# Patient Record
Sex: Male | Born: 1955 | Race: White | Hispanic: No | Marital: Single | Smoking: Light tobacco smoker
Health system: Southern US, Community
[De-identification: ages and names within clinical notes are randomized; demographics above are authoritative.]

## PROBLEM LIST (undated history)

## (undated) DIAGNOSIS — J90 Pleural effusion, not elsewhere classified: Secondary | ICD-10-CM

## (undated) DIAGNOSIS — F418 Other specified anxiety disorders: Secondary | ICD-10-CM

## (undated) DIAGNOSIS — C801 Malignant (primary) neoplasm, unspecified: Secondary | ICD-10-CM

## (undated) DIAGNOSIS — J189 Pneumonia, unspecified organism: Secondary | ICD-10-CM

## (undated) DIAGNOSIS — I10 Essential (primary) hypertension: Secondary | ICD-10-CM

## (undated) DIAGNOSIS — C3490 Malignant neoplasm of unspecified part of unspecified bronchus or lung: Secondary | ICD-10-CM

## (undated) DIAGNOSIS — E43 Unspecified severe protein-calorie malnutrition: Secondary | ICD-10-CM

## (undated) DIAGNOSIS — Z9221 Personal history of antineoplastic chemotherapy: Secondary | ICD-10-CM

## (undated) HISTORY — DX: Personal history of antineoplastic chemotherapy: Z92.21

## (undated) HISTORY — DX: Malignant neoplasm of unspecified part of unspecified bronchus or lung: C34.90

---

## 2007-08-11 ENCOUNTER — Ambulatory Visit (HOSPITAL_COMMUNITY): Admission: RE | Admit: 2007-08-11 | Discharge: 2007-08-11 | Payer: Self-pay | Admitting: Family Medicine

## 2007-08-18 ENCOUNTER — Ambulatory Visit: Payer: Self-pay | Admitting: Thoracic Surgery (Cardiothoracic Vascular Surgery)

## 2007-08-19 ENCOUNTER — Encounter: Payer: Self-pay | Admitting: Thoracic Surgery (Cardiothoracic Vascular Surgery)

## 2007-08-19 ENCOUNTER — Ambulatory Visit (HOSPITAL_COMMUNITY)
Admission: RE | Admit: 2007-08-19 | Discharge: 2007-08-19 | Payer: Self-pay | Admitting: Thoracic Surgery (Cardiothoracic Vascular Surgery)

## 2007-08-19 ENCOUNTER — Ambulatory Visit: Payer: Self-pay | Admitting: Thoracic Surgery (Cardiothoracic Vascular Surgery)

## 2007-08-19 ENCOUNTER — Ambulatory Visit: Payer: Self-pay | Admitting: Internal Medicine

## 2007-08-20 ENCOUNTER — Ambulatory Visit (HOSPITAL_COMMUNITY)
Admission: RE | Admit: 2007-08-20 | Discharge: 2007-08-20 | Payer: Self-pay | Admitting: Thoracic Surgery (Cardiothoracic Vascular Surgery)

## 2007-08-24 ENCOUNTER — Ambulatory Visit: Payer: Self-pay | Admitting: Internal Medicine

## 2007-08-25 ENCOUNTER — Ambulatory Visit: Payer: Self-pay | Admitting: Thoracic Surgery (Cardiothoracic Vascular Surgery)

## 2007-08-25 LAB — CBC WITH DIFFERENTIAL/PLATELET
BASO%: 0.5 % (ref 0.0–2.0)
EOS%: 5.5 % (ref 0.0–7.0)
LYMPH%: 17.9 % (ref 14.0–48.0)
MCHC: 35.1 g/dL (ref 32.0–35.9)
MONO#: 0.8 10*3/uL (ref 0.1–0.9)
MONO%: 6.8 % (ref 0.0–13.0)
Platelets: 400 10*3/uL (ref 145–400)
RBC: 5.37 10*6/uL (ref 4.20–5.71)
WBC: 11.8 10*3/uL — ABNORMAL HIGH (ref 4.0–10.0)

## 2007-08-25 LAB — COMPREHENSIVE METABOLIC PANEL
ALT: <8 U/L (ref 0–53)
AST: 11 U/L (ref 0–37)
Alkaline Phosphatase: 60 U/L (ref 39–117)
CO2: 21 mEq/L (ref 19–32)
Sodium: 142 mEq/L (ref 135–145)
Total Bilirubin: 0.3 mg/dL (ref 0.3–1.2)
Total Protein: 6 g/dL (ref 6.0–8.3)

## 2007-08-28 ENCOUNTER — Ambulatory Visit (HOSPITAL_COMMUNITY)
Admission: RE | Admit: 2007-08-28 | Discharge: 2007-08-28 | Payer: Self-pay | Admitting: Thoracic Surgery (Cardiothoracic Vascular Surgery)

## 2007-09-04 ENCOUNTER — Ambulatory Visit (HOSPITAL_COMMUNITY): Admission: RE | Admit: 2007-09-04 | Discharge: 2007-09-04 | Payer: Self-pay | Admitting: Internal Medicine

## 2007-09-04 ENCOUNTER — Encounter (INDEPENDENT_AMBULATORY_CARE_PROVIDER_SITE_OTHER): Payer: Self-pay | Admitting: Interventional Radiology

## 2007-09-04 LAB — CBC WITH DIFFERENTIAL/PLATELET
BASO%: 0.4 % (ref 0.0–2.0)
LYMPH%: 8.5 % — ABNORMAL LOW (ref 14.0–48.0)
MCH: 28.8 pg (ref 28.0–33.4)
MCHC: 35.1 g/dL (ref 32.0–35.9)
MCV: 81.9 fL (ref 81.6–98.0)
MONO%: 6 % (ref 0.0–13.0)
NEUT%: 85.2 % — ABNORMAL HIGH (ref 40.0–75.0)
Platelets: 423 10*3/uL — ABNORMAL HIGH (ref 145–400)
RBC: 5.28 10*6/uL (ref 4.20–5.71)

## 2007-09-04 LAB — UA PROTEIN, DIPSTICK - CHCC: Protein, Urine: <30 mg/dL

## 2007-09-08 ENCOUNTER — Ambulatory Visit: Payer: Self-pay | Admitting: Thoracic Surgery (Cardiothoracic Vascular Surgery)

## 2007-09-08 ENCOUNTER — Encounter
Admission: RE | Admit: 2007-09-08 | Discharge: 2007-09-08 | Payer: Self-pay | Admitting: Thoracic Surgery (Cardiothoracic Vascular Surgery)

## 2007-09-09 ENCOUNTER — Encounter: Payer: Self-pay | Admitting: Thoracic Surgery (Cardiothoracic Vascular Surgery)

## 2007-09-09 ENCOUNTER — Ambulatory Visit (HOSPITAL_COMMUNITY)
Admission: RE | Admit: 2007-09-09 | Discharge: 2007-09-09 | Payer: Self-pay | Admitting: Thoracic Surgery (Cardiothoracic Vascular Surgery)

## 2007-09-11 LAB — COMPREHENSIVE METABOLIC PANEL
ALT: 16 U/L (ref 0–53)
CO2: 28 mEq/L (ref 19–32)
Creatinine, Ser: 0.69 mg/dL (ref 0.40–1.50)
Total Bilirubin: 0.6 mg/dL (ref 0.3–1.2)

## 2007-09-11 LAB — CBC WITH DIFFERENTIAL/PLATELET
Basophils Absolute: 0 10*3/uL (ref 0.0–0.1)
EOS%: 4.9 % (ref 0.0–7.0)
Eosinophils Absolute: 0.3 10*3/uL (ref 0.0–0.5)
HCT: 42.1 % (ref 38.7–49.9)
HGB: 14.9 g/dL (ref 13.0–17.1)
MONO#: 0.4 10*3/uL (ref 0.1–0.9)
NEUT#: 4.1 10*3/uL (ref 1.5–6.5)
NEUT%: 69.4 % (ref 40.0–75.0)
RDW: 12.4 % (ref 11.2–14.6)
WBC: 5.9 10*3/uL (ref 4.0–10.0)
lymph#: 1.1 10*3/uL (ref 0.9–3.3)

## 2007-09-18 LAB — COMPREHENSIVE METABOLIC PANEL
Albumin: 3.5 g/dL (ref 3.5–5.2)
BUN: 14 mg/dL (ref 6–23)
CO2: 27 mEq/L (ref 19–32)
Calcium: 8.5 mg/dL (ref 8.4–10.5)
Chloride: 104 mEq/L (ref 96–112)
Creatinine, Ser: 0.65 mg/dL (ref 0.40–1.50)

## 2007-09-18 LAB — CBC WITH DIFFERENTIAL/PLATELET
Basophils Absolute: 0 10*3/uL (ref 0.0–0.1)
Eosinophils Absolute: 0.1 10*3/uL (ref 0.0–0.5)
HCT: 37.6 % — ABNORMAL LOW (ref 38.7–49.9)
HGB: 13.3 g/dL (ref 13.0–17.1)
LYMPH%: 38.5 % (ref 14.0–48.0)
MONO#: 0.4 10*3/uL (ref 0.1–0.9)
NEUT#: 2.1 10*3/uL (ref 1.5–6.5)
NEUT%: 49.8 % (ref 40.0–75.0)
Platelets: 246 10*3/uL (ref 145–400)
WBC: 4.2 10*3/uL (ref 4.0–10.0)

## 2007-09-25 LAB — COMPREHENSIVE METABOLIC PANEL
ALT: 23 U/L (ref 0–53)
AST: 15 U/L (ref 0–37)
CO2: 21 mEq/L (ref 19–32)
Creatinine, Ser: 0.63 mg/dL (ref 0.40–1.50)
Sodium: 140 mEq/L (ref 135–145)
Total Bilirubin: 0.2 mg/dL — ABNORMAL LOW (ref 0.3–1.2)
Total Protein: 6.2 g/dL (ref 6.0–8.3)

## 2007-09-25 LAB — CBC WITH DIFFERENTIAL/PLATELET
BASO%: 0.3 % (ref 0.0–2.0)
Basophils Absolute: 0 10*3/uL (ref 0.0–0.1)
HCT: 43.6 % (ref 38.7–49.9)
HGB: 15.2 g/dL (ref 13.0–17.1)
MCHC: 34.8 g/dL (ref 32.0–35.9)
MONO#: 1.8 10*3/uL — ABNORMAL HIGH (ref 0.1–0.9)
NEUT%: 73.2 % (ref 40.0–75.0)
RDW: 14 % (ref 11.2–14.6)
WBC: 15.6 10*3/uL — ABNORMAL HIGH (ref 4.0–10.0)
lymph#: 2.3 10*3/uL (ref 0.9–3.3)

## 2007-09-25 LAB — UA PROTEIN, DIPSTICK - CHCC: Protein, Urine: <30 mg/dL

## 2007-09-29 ENCOUNTER — Ambulatory Visit: Payer: Self-pay | Admitting: Internal Medicine

## 2007-10-02 LAB — CBC WITH DIFFERENTIAL/PLATELET
BASO%: 0.5 % (ref 0.0–2.0)
EOS%: 1.8 % (ref 0.0–7.0)
HCT: 43 % (ref 38.7–49.9)
LYMPH%: 35.9 % (ref 14.0–48.0)
MCH: 28.9 pg (ref 28.0–33.4)
MCHC: 34.2 g/dL (ref 32.0–35.9)
MCV: 84.4 fL (ref 81.6–98.0)
MONO#: 0.6 10*3/uL (ref 0.1–0.9)
MONO%: 11.3 % (ref 0.0–13.0)
NEUT%: 50.5 % (ref 40.0–75.0)
Platelets: 227 10*3/uL (ref 145–400)
RBC: 5.09 10*6/uL (ref 4.20–5.71)
WBC: 5.3 10*3/uL (ref 4.0–10.0)

## 2007-10-02 LAB — COMPREHENSIVE METABOLIC PANEL
ALT: 34 U/L (ref 0–53)
AST: 29 U/L (ref 0–37)
Alkaline Phosphatase: 82 U/L (ref 39–117)
BUN: 20 mg/dL (ref 6–23)
Calcium: 9.7 mg/dL (ref 8.4–10.5)
Chloride: 104 mEq/L (ref 96–112)
Creatinine, Ser: 0.66 mg/dL (ref 0.40–1.50)
Total Bilirubin: 0.5 mg/dL (ref 0.3–1.2)

## 2007-10-02 LAB — UA PROTEIN, DIPSTICK - CHCC: Protein, Urine: 30 mg/dL

## 2007-10-09 LAB — COMPREHENSIVE METABOLIC PANEL
ALT: 23 U/L (ref 0–53)
CO2: 28 mEq/L (ref 19–32)
Chloride: 99 mEq/L (ref 96–112)
Sodium: 137 mEq/L (ref 135–145)
Total Bilirubin: 0.3 mg/dL (ref 0.3–1.2)
Total Protein: 6 g/dL (ref 6.0–8.3)

## 2007-10-09 LAB — CBC WITH DIFFERENTIAL/PLATELET
BASO%: 0.4 % (ref 0.0–2.0)
LYMPH%: 40.6 % (ref 14.0–48.0)
MCHC: 35 g/dL (ref 32.0–35.9)
MONO#: 0.5 10*3/uL (ref 0.1–0.9)
RBC: 4.18 10*6/uL — ABNORMAL LOW (ref 4.20–5.71)
WBC: 3.6 10*3/uL — ABNORMAL LOW (ref 4.0–10.0)
lymph#: 1.5 10*3/uL (ref 0.9–3.3)

## 2007-10-16 LAB — CBC WITH DIFFERENTIAL/PLATELET
BASO%: 1.4 % (ref 0.0–2.0)
Basophils Absolute: 0.1 10*3/uL (ref 0.0–0.1)
HCT: 36.2 % — ABNORMAL LOW (ref 38.7–49.9)
HGB: 12.6 g/dL — ABNORMAL LOW (ref 13.0–17.1)
LYMPH%: 34.9 % (ref 14.0–48.0)
MCH: 29.2 pg (ref 28.0–33.4)
MCHC: 34.7 g/dL (ref 32.0–35.9)
MONO#: 1 10*3/uL — ABNORMAL HIGH (ref 0.1–0.9)
NEUT%: 42.8 % (ref 40.0–75.0)
Platelets: 304 10*3/uL (ref 145–400)
WBC: 5.8 10*3/uL (ref 4.0–10.0)
lymph#: 2 10*3/uL (ref 0.9–3.3)

## 2007-10-16 LAB — COMPREHENSIVE METABOLIC PANEL
AST: 21 U/L (ref 0–37)
Albumin: 3.6 g/dL (ref 3.5–5.2)
BUN: 9 mg/dL (ref 6–23)
Calcium: 8.7 mg/dL (ref 8.4–10.5)
Chloride: 105 mEq/L (ref 96–112)
Creatinine, Ser: 0.66 mg/dL (ref 0.40–1.50)
Glucose, Bld: 99 mg/dL (ref 70–99)
Potassium: 3.9 mEq/L (ref 3.5–5.3)

## 2007-11-11 LAB — COMPREHENSIVE METABOLIC PANEL
ALT: 13 U/L (ref 0–53)
AST: 23 U/L (ref 0–37)
Albumin: 4 g/dL (ref 3.5–5.2)
Calcium: 9.1 mg/dL (ref 8.4–10.5)
Chloride: 105 mEq/L (ref 96–112)
Potassium: 3.6 mEq/L (ref 3.5–5.3)
Sodium: 141 mEq/L (ref 135–145)

## 2007-11-11 LAB — CBC WITH DIFFERENTIAL/PLATELET
BASO%: 1.1 % (ref 0.0–2.0)
Eosinophils Absolute: 0.1 10*3/uL (ref 0.0–0.5)
MCHC: 34.8 g/dL (ref 32.0–35.9)
MONO#: 1.1 10*3/uL — ABNORMAL HIGH (ref 0.1–0.9)
NEUT#: 4.3 10*3/uL (ref 1.5–6.5)
RBC: 4.14 10*6/uL — ABNORMAL LOW (ref 4.20–5.71)
RDW: 15.4 % — ABNORMAL HIGH (ref 11.2–14.6)
WBC: 7.3 10*3/uL (ref 4.0–10.0)
lymph#: 1.6 10*3/uL (ref 0.9–3.3)

## 2007-11-11 LAB — UA PROTEIN, DIPSTICK - CHCC: Protein, Urine: NEGATIVE mg/dL

## 2007-11-12 ENCOUNTER — Ambulatory Visit: Payer: Self-pay | Admitting: Internal Medicine

## 2007-11-12 ENCOUNTER — Ambulatory Visit (HOSPITAL_COMMUNITY): Admission: RE | Admit: 2007-11-12 | Discharge: 2007-11-12 | Payer: Self-pay | Admitting: Internal Medicine

## 2007-11-18 LAB — COMPREHENSIVE METABOLIC PANEL
AST: 20 U/L (ref 0–37)
Albumin: 4.4 g/dL (ref 3.5–5.2)
Alkaline Phosphatase: 76 U/L (ref 39–117)
BUN: 14 mg/dL (ref 6–23)
Potassium: 3.3 mEq/L — ABNORMAL LOW (ref 3.5–5.3)
Total Bilirubin: 0.6 mg/dL (ref 0.3–1.2)

## 2007-11-18 LAB — CBC WITH DIFFERENTIAL/PLATELET
BASO%: 0.3 % (ref 0.0–2.0)
EOS%: 1.9 % (ref 0.0–7.0)
HCT: 36 % — ABNORMAL LOW (ref 38.7–49.9)
MCH: 30.5 pg (ref 28.0–33.4)
MCHC: 35.2 g/dL (ref 32.0–35.9)
NEUT%: 50.7 % (ref 40.0–75.0)
RDW: 16.6 % — ABNORMAL HIGH (ref 11.2–14.6)
lymph#: 1.5 10*3/uL (ref 0.9–3.3)

## 2007-11-25 LAB — CBC WITH DIFFERENTIAL/PLATELET
Basophils Absolute: 0 10*3/uL (ref 0.0–0.1)
EOS%: 1.1 % (ref 0.0–7.0)
HCT: 35.9 % — ABNORMAL LOW (ref 38.7–49.9)
HGB: 12.7 g/dL — ABNORMAL LOW (ref 13.0–17.1)
LYMPH%: 33.9 % (ref 14.0–48.0)
MCH: 31.1 pg (ref 28.0–33.4)
MCV: 88 fL (ref 81.6–98.0)
MONO%: 11.2 % (ref 0.0–13.0)
NEUT%: 53.5 % (ref 40.0–75.0)
Platelets: 177 10*3/uL (ref 145–400)
RDW: 17.3 % — ABNORMAL HIGH (ref 11.2–14.6)

## 2007-11-25 LAB — COMPREHENSIVE METABOLIC PANEL
AST: 46 U/L — ABNORMAL HIGH (ref 0–37)
Alkaline Phosphatase: 88 U/L (ref 39–117)
BUN: 9 mg/dL (ref 6–23)
Creatinine, Ser: 0.77 mg/dL (ref 0.40–1.50)
Glucose, Bld: 109 mg/dL — ABNORMAL HIGH (ref 70–99)
Total Bilirubin: 0.3 mg/dL (ref 0.3–1.2)

## 2007-12-02 LAB — CBC WITH DIFFERENTIAL/PLATELET
Basophils Absolute: 0.1 10*3/uL (ref 0.0–0.1)
Eosinophils Absolute: 0 10*3/uL (ref 0.0–0.5)
HGB: 13.3 g/dL (ref 13.0–17.1)
MCV: 89.8 fL (ref 81.6–98.0)
MONO#: 0.3 10*3/uL (ref 0.1–0.9)
NEUT#: 4.5 10*3/uL (ref 1.5–6.5)
RBC: 4.23 10*6/uL (ref 4.20–5.71)
RDW: 18.4 % — ABNORMAL HIGH (ref 11.2–14.6)
WBC: 6 10*3/uL (ref 4.0–10.0)

## 2007-12-02 LAB — COMPREHENSIVE METABOLIC PANEL
AST: 24 U/L (ref 0–37)
Albumin: 4.5 g/dL (ref 3.5–5.2)
Alkaline Phosphatase: 70 U/L (ref 39–117)
BUN: 12 mg/dL (ref 6–23)
Calcium: 9.6 mg/dL (ref 8.4–10.5)
Creatinine, Ser: 0.7 mg/dL (ref 0.40–1.50)
Glucose, Bld: 144 mg/dL — ABNORMAL HIGH (ref 70–99)
Potassium: 3.9 mEq/L (ref 3.5–5.3)

## 2007-12-09 LAB — CBC WITH DIFFERENTIAL/PLATELET
Basophils Absolute: 0 10*3/uL (ref 0.0–0.1)
Eosinophils Absolute: 0 10*3/uL (ref 0.0–0.5)
HCT: 33.1 % — ABNORMAL LOW (ref 38.7–49.9)
HGB: 11.7 g/dL — ABNORMAL LOW (ref 13.0–17.1)
LYMPH%: 49 % — ABNORMAL HIGH (ref 14.0–48.0)
MCV: 89.8 fL (ref 81.6–98.0)
MONO#: 0.5 10*3/uL (ref 0.1–0.9)
MONO%: 13.6 % — ABNORMAL HIGH (ref 0.0–13.0)
NEUT#: 1.4 10*3/uL — ABNORMAL LOW (ref 1.5–6.5)
NEUT%: 35.7 % — ABNORMAL LOW (ref 40.0–75.0)
Platelets: 165 10*3/uL (ref 145–400)
WBC: 3.9 10*3/uL — ABNORMAL LOW (ref 4.0–10.0)

## 2007-12-09 LAB — COMPREHENSIVE METABOLIC PANEL
ALT: 31 U/L (ref 0–53)
AST: 29 U/L (ref 0–37)
BUN: 14 mg/dL (ref 6–23)
Creatinine, Ser: 0.76 mg/dL (ref 0.40–1.50)
Total Bilirubin: 0.6 mg/dL (ref 0.3–1.2)

## 2007-12-09 LAB — UA PROTEIN, DIPSTICK - CHCC: Protein, Urine: NEGATIVE mg/dL

## 2007-12-16 LAB — COMPREHENSIVE METABOLIC PANEL
Albumin: 4.4 g/dL (ref 3.5–5.2)
Alkaline Phosphatase: 75 U/L (ref 39–117)
BUN: 14 mg/dL (ref 6–23)
CO2: 26 mEq/L (ref 19–32)
Calcium: 9.5 mg/dL (ref 8.4–10.5)
Glucose, Bld: 105 mg/dL — ABNORMAL HIGH (ref 70–99)
Potassium: 3.5 mEq/L (ref 3.5–5.3)
Sodium: 140 mEq/L (ref 135–145)
Total Protein: 6.7 g/dL (ref 6.0–8.3)

## 2007-12-16 LAB — CBC WITH DIFFERENTIAL/PLATELET
Basophils Absolute: 0 10*3/uL (ref 0.0–0.1)
EOS%: 0.4 % (ref 0.0–7.0)
Eosinophils Absolute: 0 10*3/uL (ref 0.0–0.5)
HCT: 33.2 % — ABNORMAL LOW (ref 38.7–49.9)
HGB: 11.7 g/dL — ABNORMAL LOW (ref 13.0–17.1)
MCH: 32 pg (ref 28.0–33.4)
MCV: 90.9 fL (ref 81.6–98.0)
NEUT#: 3.6 10*3/uL (ref 1.5–6.5)
NEUT%: 56.9 % (ref 40.0–75.0)
RDW: 17.1 % — ABNORMAL HIGH (ref 11.2–14.6)
lymph#: 1.8 10*3/uL (ref 0.9–3.3)

## 2007-12-23 LAB — COMPREHENSIVE METABOLIC PANEL
ALT: 15 U/L (ref 0–53)
AST: 19 U/L (ref 0–37)
Albumin: 4.6 g/dL (ref 3.5–5.2)
Alkaline Phosphatase: 69 U/L (ref 39–117)
Calcium: 9.9 mg/dL (ref 8.4–10.5)
Chloride: 101 mEq/L (ref 96–112)
Potassium: 4.1 mEq/L (ref 3.5–5.3)
Sodium: 138 mEq/L (ref 135–145)
Total Protein: 7.5 g/dL (ref 6.0–8.3)

## 2007-12-23 LAB — CBC WITH DIFFERENTIAL/PLATELET
BASO%: 0.4 % (ref 0.0–2.0)
EOS%: 0 % (ref 0.0–7.0)
HCT: 36.5 % — ABNORMAL LOW (ref 38.7–49.9)
MCHC: 36 g/dL — ABNORMAL HIGH (ref 32.0–35.9)
MONO#: 0.8 10*3/uL (ref 0.1–0.9)
NEUT%: 71.9 % (ref 40.0–75.0)
RBC: 4.18 10*6/uL — ABNORMAL LOW (ref 4.20–5.71)
RDW: 14.9 % — ABNORMAL HIGH (ref 11.2–14.6)
WBC: 8.7 10*3/uL (ref 4.0–10.0)
lymph#: 1.6 10*3/uL (ref 0.9–3.3)

## 2007-12-23 LAB — UA PROTEIN, DIPSTICK - CHCC: Protein, Urine: <30 mg/dL

## 2007-12-30 ENCOUNTER — Ambulatory Visit: Payer: Self-pay | Admitting: Internal Medicine

## 2007-12-30 LAB — CBC WITH DIFFERENTIAL/PLATELET
BASO%: 0.5 % (ref 0.0–2.0)
Basophils Absolute: 0 10*3/uL (ref 0.0–0.1)
EOS%: 1 % (ref 0.0–7.0)
MCH: 32.2 pg (ref 28.0–33.4)
MCHC: 35.4 g/dL (ref 32.0–35.9)
MCV: 91 fL (ref 81.6–98.0)
MONO%: 11.8 % (ref 0.0–13.0)
RDW: 16.4 % — ABNORMAL HIGH (ref 11.2–14.6)
lymph#: 2.3 10*3/uL (ref 0.9–3.3)

## 2007-12-30 LAB — COMPREHENSIVE METABOLIC PANEL
AST: 28 U/L (ref 0–37)
Albumin: 4.5 g/dL (ref 3.5–5.2)
Alkaline Phosphatase: 72 U/L (ref 39–117)
BUN: 19 mg/dL (ref 6–23)
Glucose, Bld: 113 mg/dL — ABNORMAL HIGH (ref 70–99)
Potassium: 3.8 mEq/L (ref 3.5–5.3)
Sodium: 138 mEq/L (ref 135–145)
Total Bilirubin: 0.4 mg/dL (ref 0.3–1.2)

## 2008-01-06 ENCOUNTER — Ambulatory Visit (HOSPITAL_COMMUNITY): Admission: RE | Admit: 2008-01-06 | Discharge: 2008-01-06 | Payer: Self-pay | Admitting: Internal Medicine

## 2008-01-06 LAB — CBC WITH DIFFERENTIAL/PLATELET
Basophils Absolute: 0 10*3/uL (ref 0.0–0.1)
Eosinophils Absolute: 0 10*3/uL (ref 0.0–0.5)
HGB: 11.1 g/dL — ABNORMAL LOW (ref 13.0–17.1)
LYMPH%: 37 % (ref 14.0–48.0)
MCV: 90.8 fL (ref 81.6–98.0)
MONO#: 0.5 10*3/uL (ref 0.1–0.9)
NEUT#: 2 10*3/uL (ref 1.5–6.5)
Platelets: 64 10*3/uL — ABNORMAL LOW (ref 145–400)
RBC: 3.4 10*6/uL — ABNORMAL LOW (ref 4.20–5.71)
RDW: 16.5 % — ABNORMAL HIGH (ref 11.2–14.6)
WBC: 3.9 10*3/uL — ABNORMAL LOW (ref 4.0–10.0)

## 2008-01-06 LAB — COMPREHENSIVE METABOLIC PANEL
ALT: 30 U/L (ref 0–53)
CO2: 26 mEq/L (ref 19–32)
Calcium: 8.9 mg/dL (ref 8.4–10.5)
Chloride: 105 mEq/L (ref 96–112)
Glucose, Bld: 145 mg/dL — ABNORMAL HIGH (ref 70–99)
Sodium: 141 mEq/L (ref 135–145)
Total Protein: 6.5 g/dL (ref 6.0–8.3)

## 2008-01-06 LAB — UA PROTEIN, DIPSTICK - CHCC: Protein, Urine: NEGATIVE mg/dL

## 2008-01-13 LAB — CBC WITH DIFFERENTIAL/PLATELET
Basophils Absolute: 0 10*3/uL (ref 0.0–0.1)
EOS%: 0 % (ref 0.0–7.0)
HCT: 35 % — ABNORMAL LOW (ref 38.7–49.9)
HGB: 12.3 g/dL — ABNORMAL LOW (ref 13.0–17.1)
LYMPH%: 14.4 % (ref 14.0–48.0)
MCH: 32.6 pg (ref 28.0–33.4)
MCV: 92.7 fL (ref 81.6–98.0)
MONO%: 13.5 % — ABNORMAL HIGH (ref 0.0–13.0)
NEUT%: 71.8 % (ref 40.0–75.0)

## 2008-02-03 LAB — CBC WITH DIFFERENTIAL/PLATELET
EOS%: 2.4 % (ref 0.0–7.0)
MCH: 32.3 pg (ref 28.0–33.4)
MCV: 91.9 fL (ref 81.6–98.0)
MONO%: 7.7 % (ref 0.0–13.0)
RBC: 3.79 10*6/uL — ABNORMAL LOW (ref 4.20–5.71)
RDW: 16.1 % — ABNORMAL HIGH (ref 11.2–14.6)

## 2008-02-03 LAB — COMPREHENSIVE METABOLIC PANEL
AST: 24 U/L (ref 0–37)
Alkaline Phosphatase: 65 U/L (ref 39–117)
BUN: 10 mg/dL (ref 6–23)
Creatinine, Ser: 0.81 mg/dL (ref 0.40–1.50)
Glucose, Bld: 168 mg/dL — ABNORMAL HIGH (ref 70–99)

## 2008-02-20 ENCOUNTER — Ambulatory Visit: Payer: Self-pay | Admitting: Internal Medicine

## 2008-02-24 LAB — COMPREHENSIVE METABOLIC PANEL
AST: 29 U/L (ref 0–37)
Alkaline Phosphatase: 67 U/L (ref 39–117)
Glucose, Bld: 109 mg/dL — ABNORMAL HIGH (ref 70–99)
Sodium: 140 mEq/L (ref 135–145)
Total Bilirubin: 0.3 mg/dL (ref 0.3–1.2)
Total Protein: 6.6 g/dL (ref 6.0–8.3)

## 2008-02-24 LAB — UA PROTEIN, DIPSTICK - CHCC: Protein, Urine: 30 mg/dL

## 2008-02-24 LAB — CBC WITH DIFFERENTIAL/PLATELET
Basophils Absolute: 0.1 10*3/uL (ref 0.0–0.1)
Eosinophils Absolute: 0.2 10*3/uL (ref 0.0–0.5)
LYMPH%: 29.8 % (ref 14.0–48.0)
MCV: 91.1 fL (ref 81.6–98.0)
MONO%: 10.7 % (ref 0.0–13.0)
NEUT#: 4.7 10*3/uL (ref 1.5–6.5)
Platelets: 237 10*3/uL (ref 145–400)
RBC: 4 10*6/uL — ABNORMAL LOW (ref 4.20–5.71)

## 2008-03-09 ENCOUNTER — Ambulatory Visit (HOSPITAL_COMMUNITY): Admission: RE | Admit: 2008-03-09 | Discharge: 2008-03-09 | Payer: Self-pay | Admitting: Internal Medicine

## 2008-03-16 LAB — COMPREHENSIVE METABOLIC PANEL
AST: 15 U/L (ref 0–37)
Albumin: 4.4 g/dL (ref 3.5–5.2)
Alkaline Phosphatase: 60 U/L (ref 39–117)
BUN: 18 mg/dL (ref 6–23)
Potassium: 3.6 mEq/L (ref 3.5–5.3)
Sodium: 140 mEq/L (ref 135–145)
Total Bilirubin: 0.7 mg/dL (ref 0.3–1.2)
Total Protein: 6.5 g/dL (ref 6.0–8.3)

## 2008-03-16 LAB — CBC WITH DIFFERENTIAL/PLATELET
BASO%: 0.9 % (ref 0.0–2.0)
EOS%: 0 % (ref 0.0–7.0)
MCH: 32.3 pg (ref 28.0–33.4)
MCHC: 34.8 g/dL (ref 32.0–35.9)
MCV: 92.8 fL (ref 81.6–98.0)
MONO%: 8.2 % (ref 0.0–13.0)
RDW: 14.8 % — ABNORMAL HIGH (ref 11.2–14.6)
lymph#: 1.3 10*3/uL (ref 0.9–3.3)

## 2008-04-06 ENCOUNTER — Ambulatory Visit: Payer: Self-pay | Admitting: Internal Medicine

## 2008-04-27 LAB — CBC WITH DIFFERENTIAL/PLATELET
BASO%: 0.4 % (ref 0.0–2.0)
EOS%: 0.2 % (ref 0.0–7.0)
LYMPH%: 14.8 % (ref 14.0–48.0)
MCH: 31.7 pg (ref 28.0–33.4)
MCHC: 34.2 g/dL (ref 32.0–35.9)
MCV: 92.9 fL (ref 81.6–98.0)
MONO%: 4 % (ref 0.0–13.0)
Platelets: 272 10*3/uL (ref 145–400)
RBC: 4.4 10*6/uL (ref 4.20–5.71)
WBC: 7.3 10*3/uL (ref 4.0–10.0)

## 2008-04-27 LAB — UA PROTEIN, DIPSTICK - CHCC: Protein, Urine: 2000 mg/dL

## 2008-05-13 ENCOUNTER — Ambulatory Visit (HOSPITAL_COMMUNITY): Admission: RE | Admit: 2008-05-13 | Discharge: 2008-05-13 | Payer: Self-pay | Admitting: Internal Medicine

## 2008-05-18 ENCOUNTER — Ambulatory Visit: Payer: Self-pay | Admitting: Internal Medicine

## 2008-05-18 LAB — COMPREHENSIVE METABOLIC PANEL
AST: 24 U/L (ref 0–37)
Albumin: 4.5 g/dL (ref 3.5–5.2)
Alkaline Phosphatase: 78 U/L (ref 39–117)
Potassium: 3.3 mEq/L — ABNORMAL LOW (ref 3.5–5.3)
Sodium: 140 mEq/L (ref 135–145)
Total Protein: 6.8 g/dL (ref 6.0–8.3)

## 2008-05-18 LAB — CBC WITH DIFFERENTIAL/PLATELET
EOS%: 1.8 % (ref 0.0–7.0)
MCH: 31.3 pg (ref 27.2–33.4)
MCV: 92.3 fL (ref 79.3–98.0)
MONO%: 8.9 % (ref 0.0–14.0)
RBC: 4.44 10*6/uL (ref 4.20–5.82)
RDW: 15.1 % — ABNORMAL HIGH (ref 11.0–14.6)

## 2008-06-08 LAB — CBC WITH DIFFERENTIAL/PLATELET
Eosinophils Absolute: 0 10*3/uL (ref 0.0–0.5)
LYMPH%: 11.9 % — ABNORMAL LOW (ref 14.0–49.0)
MCHC: 34.5 g/dL (ref 32.0–36.0)
MCV: 90.8 fL (ref 79.3–98.0)
MONO%: 4.8 % (ref 0.0–14.0)
NEUT#: 7.6 10*3/uL — ABNORMAL HIGH (ref 1.5–6.5)
Platelets: 318 10*3/uL (ref 140–400)
RBC: 4.24 10*6/uL (ref 4.20–5.82)

## 2008-06-08 LAB — COMPREHENSIVE METABOLIC PANEL
ALT: 15 U/L (ref 0–53)
BUN: 11 mg/dL (ref 6–23)
CO2: 23 mEq/L (ref 19–32)
Creatinine, Ser: 0.73 mg/dL (ref 0.40–1.50)
Total Bilirubin: 0.4 mg/dL (ref 0.3–1.2)

## 2008-06-08 LAB — UA PROTEIN, DIPSTICK - CHCC: Protein, Urine: 100 mg/dL

## 2008-06-29 LAB — CBC WITH DIFFERENTIAL/PLATELET
BASO%: 0.2 % (ref 0.0–2.0)
Basophils Absolute: 0 10*3/uL (ref 0.0–0.1)
EOS%: 0 % (ref 0.0–7.0)
HCT: 41.3 % (ref 38.4–49.9)
HGB: 14.2 g/dL (ref 13.0–17.1)
LYMPH%: 10.4 % — ABNORMAL LOW (ref 14.0–49.0)
MCH: 31.6 pg (ref 27.2–33.4)
MCHC: 34.4 g/dL (ref 32.0–36.0)
MCV: 91.9 fL (ref 79.3–98.0)
NEUT%: 84.4 % — ABNORMAL HIGH (ref 39.0–75.0)
Platelets: 329 10*3/uL (ref 140–400)

## 2008-06-29 LAB — COMPREHENSIVE METABOLIC PANEL
AST: 20 U/L (ref 0–37)
Albumin: 4.3 g/dL (ref 3.5–5.2)
Alkaline Phosphatase: 68 U/L (ref 39–117)
BUN: 9 mg/dL (ref 6–23)
Calcium: 9.4 mg/dL (ref 8.4–10.5)
Creatinine, Ser: 0.82 mg/dL (ref 0.40–1.50)
Glucose, Bld: 119 mg/dL — ABNORMAL HIGH (ref 70–99)
Potassium: 4.1 mEq/L (ref 3.5–5.3)

## 2008-07-15 ENCOUNTER — Ambulatory Visit (HOSPITAL_COMMUNITY): Admission: RE | Admit: 2008-07-15 | Discharge: 2008-07-15 | Payer: Self-pay | Admitting: Internal Medicine

## 2008-07-23 ENCOUNTER — Ambulatory Visit: Payer: Self-pay | Admitting: Internal Medicine

## 2008-07-27 LAB — COMPREHENSIVE METABOLIC PANEL
ALT: 11 U/L (ref 0–53)
AST: 27 U/L (ref 0–37)
Alkaline Phosphatase: 72 U/L (ref 39–117)
BUN: 16 mg/dL (ref 6–23)
Calcium: 10.4 mg/dL (ref 8.4–10.5)
Chloride: 103 mEq/L (ref 96–112)
Creatinine, Ser: 0.95 mg/dL (ref 0.40–1.50)
Total Bilirubin: 0.6 mg/dL (ref 0.3–1.2)

## 2008-07-27 LAB — CBC WITH DIFFERENTIAL/PLATELET
Basophils Absolute: 0.1 10*3/uL (ref 0.0–0.1)
EOS%: 0 % (ref 0.0–7.0)
Eosinophils Absolute: 0 10*3/uL (ref 0.0–0.5)
LYMPH%: 8.7 % — ABNORMAL LOW (ref 14.0–49.0)
MCH: 31.5 pg (ref 27.2–33.4)
MCV: 91.3 fL (ref 79.3–98.0)
MONO%: 5 % (ref 0.0–14.0)
NEUT#: 11.1 10*3/uL — ABNORMAL HIGH (ref 1.5–6.5)
Platelets: 282 10*3/uL (ref 140–400)
RBC: 4.48 10*6/uL (ref 4.20–5.82)

## 2008-07-27 LAB — UA PROTEIN, DIPSTICK - CHCC: Protein, Urine: 2000 mg/dL

## 2008-08-17 LAB — CBC WITH DIFFERENTIAL/PLATELET
BASO%: 0.8 % (ref 0.0–2.0)
Eosinophils Absolute: 0 10*3/uL (ref 0.0–0.5)
HCT: 35.7 % — ABNORMAL LOW (ref 38.4–49.9)
LYMPH%: 11.7 % — ABNORMAL LOW (ref 14.0–49.0)
MCHC: 35.6 g/dL (ref 32.0–36.0)
MONO#: 1 10*3/uL — ABNORMAL HIGH (ref 0.1–0.9)
NEUT#: 8.7 10*3/uL — ABNORMAL HIGH (ref 1.5–6.5)
Platelets: 300 10*3/uL (ref 140–400)
RBC: 3.88 10*6/uL — ABNORMAL LOW (ref 4.20–5.82)
WBC: 11.2 10*3/uL — ABNORMAL HIGH (ref 4.0–10.3)
lymph#: 1.3 10*3/uL (ref 0.9–3.3)

## 2008-08-17 LAB — COMPREHENSIVE METABOLIC PANEL
ALT: 8 U/L (ref 0–53)
BUN: 11 mg/dL (ref 6–23)
CO2: 23 mEq/L (ref 19–32)
Creatinine, Ser: 0.78 mg/dL (ref 0.40–1.50)
Glucose, Bld: 98 mg/dL (ref 70–99)
Total Bilirubin: 0.3 mg/dL (ref 0.3–1.2)

## 2008-08-17 LAB — UA PROTEIN, DIPSTICK - CHCC: Protein, Urine: 30 mg/dL

## 2008-09-07 ENCOUNTER — Ambulatory Visit: Payer: Self-pay | Admitting: Internal Medicine

## 2008-09-07 LAB — COMPREHENSIVE METABOLIC PANEL
ALT: 12 U/L (ref 0–53)
AST: 19 U/L (ref 0–37)
Albumin: 4.3 g/dL (ref 3.5–5.2)
Alkaline Phosphatase: 77 U/L (ref 39–117)
BUN: 11 mg/dL (ref 6–23)
Potassium: 4.2 mEq/L (ref 3.5–5.3)
Sodium: 142 mEq/L (ref 135–145)
Total Protein: 6.9 g/dL (ref 6.0–8.3)

## 2008-09-07 LAB — CBC WITH DIFFERENTIAL/PLATELET
BASO%: 0.3 % (ref 0.0–2.0)
EOS%: 0.1 % (ref 0.0–7.0)
HCT: 39.2 % (ref 38.4–49.9)
HGB: 13.7 g/dL (ref 13.0–17.1)
MONO#: 0.1 10*3/uL (ref 0.1–0.9)
MONO%: 1.2 % (ref 0.0–14.0)
NEUT#: 6.9 10*3/uL — ABNORMAL HIGH (ref 1.5–6.5)
Platelets: 329 10*3/uL (ref 140–400)
WBC: 7.9 10*3/uL (ref 4.0–10.3)

## 2008-09-28 ENCOUNTER — Ambulatory Visit (HOSPITAL_COMMUNITY): Admission: RE | Admit: 2008-09-28 | Discharge: 2008-09-28 | Payer: Self-pay | Admitting: Internal Medicine

## 2008-10-11 ENCOUNTER — Ambulatory Visit: Payer: Self-pay | Admitting: Internal Medicine

## 2008-10-11 LAB — COMPREHENSIVE METABOLIC PANEL
ALT: 12 U/L (ref 0–53)
Albumin: 4.3 g/dL (ref 3.5–5.2)
CO2: 27 mEq/L (ref 19–32)
Calcium: 9.4 mg/dL (ref 8.4–10.5)
Chloride: 102 mEq/L (ref 96–112)
Glucose, Bld: 174 mg/dL — ABNORMAL HIGH (ref 70–99)
Sodium: 140 mEq/L (ref 135–145)
Total Bilirubin: 0.4 mg/dL (ref 0.3–1.2)
Total Protein: 6.5 g/dL (ref 6.0–8.3)

## 2008-10-11 LAB — CBC WITH DIFFERENTIAL/PLATELET
BASO%: 0.5 % (ref 0.0–2.0)
Eosinophils Absolute: 0 10*3/uL (ref 0.0–0.5)
HCT: 39.7 % (ref 38.4–49.9)
LYMPH%: 9.5 % — ABNORMAL LOW (ref 14.0–49.0)
MCHC: 34.1 g/dL (ref 32.0–36.0)
MONO#: 0.4 10*3/uL (ref 0.1–0.9)
NEUT#: 7.5 10*3/uL — ABNORMAL HIGH (ref 1.5–6.5)
Platelets: 159 10*3/uL (ref 140–400)
RBC: 4.32 10*6/uL (ref 4.20–5.82)
WBC: 8.8 10*3/uL (ref 4.0–10.3)
lymph#: 0.8 10*3/uL — ABNORMAL LOW (ref 0.9–3.3)

## 2008-11-01 LAB — CBC WITH DIFFERENTIAL/PLATELET
Basophils Absolute: 0 10*3/uL (ref 0.0–0.1)
Eosinophils Absolute: 0.1 10*3/uL (ref 0.0–0.5)
HCT: 39.7 % (ref 38.4–49.9)
HGB: 13.7 g/dL (ref 13.0–17.1)
LYMPH%: 12.7 % — ABNORMAL LOW (ref 14.0–49.0)
MONO#: 0.5 10*3/uL (ref 0.1–0.9)
NEUT#: 7.7 10*3/uL — ABNORMAL HIGH (ref 1.5–6.5)
NEUT%: 80.6 % — ABNORMAL HIGH (ref 39.0–75.0)
Platelets: 282 10*3/uL (ref 140–400)
WBC: 9.5 10*3/uL (ref 4.0–10.3)
lymph#: 1.2 10*3/uL (ref 0.9–3.3)

## 2008-11-01 LAB — COMPREHENSIVE METABOLIC PANEL
ALT: 8 U/L (ref 0–53)
CO2: 25 mEq/L (ref 19–32)
Calcium: 9.2 mg/dL (ref 8.4–10.5)
Chloride: 102 mEq/L (ref 96–112)
Creatinine, Ser: 1 mg/dL (ref 0.40–1.50)
Glucose, Bld: 111 mg/dL — ABNORMAL HIGH (ref 70–99)
Total Bilirubin: 0.4 mg/dL (ref 0.3–1.2)
Total Protein: 6.4 g/dL (ref 6.0–8.3)

## 2008-11-01 LAB — UA PROTEIN, DIPSTICK - CHCC: Protein, Urine: 100 mg/dL

## 2008-11-18 ENCOUNTER — Ambulatory Visit: Payer: Self-pay | Admitting: Internal Medicine

## 2008-11-22 LAB — CBC WITH DIFFERENTIAL/PLATELET
Basophils Absolute: 0.1 10*3/uL (ref 0.0–0.1)
Eosinophils Absolute: 0.3 10*3/uL (ref 0.0–0.5)
HCT: 39.4 % (ref 38.4–49.9)
HGB: 13.3 g/dL (ref 13.0–17.1)
LYMPH%: 25.4 % (ref 14.0–49.0)
MCV: 90 fL (ref 79.3–98.0)
MONO#: 1.1 10*3/uL — ABNORMAL HIGH (ref 0.1–0.9)
MONO%: 12.2 % (ref 0.0–14.0)
NEUT#: 5.2 10*3/uL (ref 1.5–6.5)
Platelets: 239 10*3/uL (ref 140–400)

## 2008-11-22 LAB — COMPREHENSIVE METABOLIC PANEL
CO2: 29 mEq/L (ref 19–32)
Calcium: 9.2 mg/dL (ref 8.4–10.5)
Chloride: 106 mEq/L (ref 96–112)
Creatinine, Ser: 0.93 mg/dL (ref 0.40–1.50)
Total Protein: 5.9 g/dL — ABNORMAL LOW (ref 6.0–8.3)

## 2008-12-08 ENCOUNTER — Ambulatory Visit (HOSPITAL_COMMUNITY): Admission: RE | Admit: 2008-12-08 | Discharge: 2008-12-08 | Payer: Self-pay | Admitting: Internal Medicine

## 2008-12-13 LAB — CBC WITH DIFFERENTIAL/PLATELET
BASO%: 0.2 % (ref 0.0–2.0)
LYMPH%: 11.2 % — ABNORMAL LOW (ref 14.0–49.0)
MCHC: 34.2 g/dL (ref 32.0–36.0)
MCV: 90.9 fL (ref 79.3–98.0)
MONO%: 4.4 % (ref 0.0–14.0)
Platelets: 291 10*3/uL (ref 140–400)
RBC: 4.27 10*6/uL (ref 4.20–5.82)
WBC: 8.9 10*3/uL (ref 4.0–10.3)

## 2008-12-13 LAB — COMPREHENSIVE METABOLIC PANEL
ALT: 8 U/L (ref 0–53)
CO2: 22 mEq/L (ref 19–32)
Potassium: 3.8 mEq/L (ref 3.5–5.3)
Sodium: 140 mEq/L (ref 135–145)
Total Bilirubin: 0.5 mg/dL (ref 0.3–1.2)
Total Protein: 6.2 g/dL (ref 6.0–8.3)

## 2008-12-13 LAB — UA PROTEIN, DIPSTICK - CHCC: Protein, Urine: 300 mg/dL

## 2008-12-28 ENCOUNTER — Ambulatory Visit: Payer: Self-pay | Admitting: Internal Medicine

## 2008-12-30 LAB — CBC WITH DIFFERENTIAL/PLATELET
BASO%: 0.2 % (ref 0.0–2.0)
EOS%: 1.3 % (ref 0.0–7.0)
MCH: 31.6 pg (ref 27.2–33.4)
MCHC: 34.3 g/dL (ref 32.0–36.0)
RDW: 16.9 % — ABNORMAL HIGH (ref 11.0–14.6)
lymph#: 1.9 10*3/uL (ref 0.9–3.3)

## 2008-12-30 LAB — COMPREHENSIVE METABOLIC PANEL
ALT: 9 U/L (ref 0–53)
AST: 18 U/L (ref 0–37)
Albumin: 4 g/dL (ref 3.5–5.2)
Calcium: 8.5 mg/dL (ref 8.4–10.5)
Chloride: 104 mEq/L (ref 96–112)
Creatinine, Ser: 1.36 mg/dL (ref 0.40–1.50)
Potassium: 3.2 mEq/L — ABNORMAL LOW (ref 3.5–5.3)

## 2009-01-03 LAB — CBC WITH DIFFERENTIAL/PLATELET
Basophils Absolute: 0 10*3/uL (ref 0.0–0.1)
EOS%: 0.2 % (ref 0.0–7.0)
HGB: 13.1 g/dL (ref 13.0–17.1)
LYMPH%: 8.8 % — ABNORMAL LOW (ref 14.0–49.0)
MCH: 31 pg (ref 27.2–33.4)
MCV: 93.4 fL (ref 79.3–98.0)
MONO%: 1.2 % (ref 0.0–14.0)
RDW: 15.8 % — ABNORMAL HIGH (ref 11.0–14.6)

## 2009-01-03 LAB — COMPREHENSIVE METABOLIC PANEL
ALT: 10 U/L (ref 0–53)
AST: 24 U/L (ref 0–37)
Albumin: 4.1 g/dL (ref 3.5–5.2)
Alkaline Phosphatase: 66 U/L (ref 39–117)
BUN: 16 mg/dL (ref 6–23)
CO2: 20 mEq/L (ref 19–32)
Calcium: 9.2 mg/dL (ref 8.4–10.5)
Chloride: 105 mEq/L (ref 96–112)
Creatinine, Ser: 1.1 mg/dL (ref 0.40–1.50)
Glucose, Bld: 216 mg/dL — ABNORMAL HIGH (ref 70–99)
Potassium: 4.4 mEq/L (ref 3.5–5.3)
Sodium: 140 mEq/L (ref 135–145)
Total Bilirubin: 0.6 mg/dL (ref 0.3–1.2)
Total Protein: 6.3 g/dL (ref 6.0–8.3)

## 2009-01-24 LAB — CBC WITH DIFFERENTIAL/PLATELET
Basophils Absolute: 0 10*3/uL (ref 0.0–0.1)
Eosinophils Absolute: 0 10*3/uL (ref 0.0–0.5)
HCT: 36.9 % — ABNORMAL LOW (ref 38.4–49.9)
HGB: 12.3 g/dL — ABNORMAL LOW (ref 13.0–17.1)
LYMPH%: 9.7 % — ABNORMAL LOW (ref 14.0–49.0)
MCV: 91.1 fL (ref 79.3–98.0)
MONO#: 0.6 10*3/uL (ref 0.1–0.9)
NEUT#: 9.9 10*3/uL — ABNORMAL HIGH (ref 1.5–6.5)
NEUT%: 85.5 % — ABNORMAL HIGH (ref 39.0–75.0)
Platelets: 304 10*3/uL (ref 140–400)
WBC: 11.6 10*3/uL — ABNORMAL HIGH (ref 4.0–10.3)

## 2009-01-24 LAB — COMPREHENSIVE METABOLIC PANEL
ALT: 8 U/L (ref 0–53)
BUN: 14 mg/dL (ref 6–23)
CO2: 21 mEq/L (ref 19–32)
Calcium: 8.8 mg/dL (ref 8.4–10.5)
Chloride: 104 mEq/L (ref 96–112)
Creatinine, Ser: 1.01 mg/dL (ref 0.40–1.50)
Glucose, Bld: 121 mg/dL — ABNORMAL HIGH (ref 70–99)

## 2009-01-24 LAB — UA PROTEIN, DIPSTICK - CHCC: Protein, Urine: 300 mg/dL

## 2009-02-10 ENCOUNTER — Ambulatory Visit: Payer: Self-pay | Admitting: Internal Medicine

## 2009-02-11 ENCOUNTER — Ambulatory Visit (HOSPITAL_COMMUNITY): Admission: RE | Admit: 2009-02-11 | Discharge: 2009-02-11 | Payer: Self-pay | Admitting: Internal Medicine

## 2009-02-14 LAB — CBC WITH DIFFERENTIAL/PLATELET
BASO%: 0.1 % (ref 0.0–2.0)
HCT: 35.4 % — ABNORMAL LOW (ref 38.4–49.9)
LYMPH%: 8.3 % — ABNORMAL LOW (ref 14.0–49.0)
MCH: 30.1 pg (ref 27.2–33.4)
MCHC: 32.8 g/dL (ref 32.0–36.0)
MCV: 91.9 fL (ref 79.3–98.0)
MONO#: 0.2 10*3/uL (ref 0.1–0.9)
MONO%: 1.8 % (ref 0.0–14.0)
NEUT%: 89.8 % — ABNORMAL HIGH (ref 39.0–75.0)
Platelets: 356 10*3/uL (ref 140–400)
RBC: 3.85 10*6/uL — ABNORMAL LOW (ref 4.20–5.82)
nRBC: 0 % (ref 0–0)

## 2009-02-14 LAB — COMPREHENSIVE METABOLIC PANEL
ALT: 8 U/L (ref 0–53)
AST: 17 U/L (ref 0–37)
Alkaline Phosphatase: 89 U/L (ref 39–117)
CO2: 21 mEq/L (ref 19–32)
Creatinine, Ser: 0.98 mg/dL (ref 0.40–1.50)
Sodium: 141 mEq/L (ref 135–145)
Total Bilirubin: 0.3 mg/dL (ref 0.3–1.2)
Total Protein: 6.4 g/dL (ref 6.0–8.3)

## 2009-03-07 LAB — CBC WITH DIFFERENTIAL/PLATELET
Basophils Absolute: 0.3 10*3/uL — ABNORMAL HIGH (ref 0.0–0.1)
Eosinophils Absolute: 0 10*3/uL (ref 0.0–0.5)
HCT: 32.9 % — ABNORMAL LOW (ref 38.4–49.9)
HGB: 11.1 g/dL — ABNORMAL LOW (ref 13.0–17.1)
NEUT#: 9.8 10*3/uL — ABNORMAL HIGH (ref 1.5–6.5)
NEUT%: 87.4 % — ABNORMAL HIGH (ref 39.0–75.0)
RDW: 17.8 % — ABNORMAL HIGH (ref 11.0–14.6)
lymph#: 0.9 10*3/uL (ref 0.9–3.3)

## 2009-03-07 LAB — COMPREHENSIVE METABOLIC PANEL
AST: 24 U/L (ref 0–37)
Albumin: 3.8 g/dL (ref 3.5–5.2)
Alkaline Phosphatase: 71 U/L (ref 39–117)
BUN: 7 mg/dL (ref 6–23)
Calcium: 9.1 mg/dL (ref 8.4–10.5)
Creatinine, Ser: 1.04 mg/dL (ref 0.40–1.50)
Glucose, Bld: 138 mg/dL — ABNORMAL HIGH (ref 70–99)
Potassium: 3.2 mEq/L — ABNORMAL LOW (ref 3.5–5.3)

## 2009-03-12 DIAGNOSIS — Z9221 Personal history of antineoplastic chemotherapy: Secondary | ICD-10-CM

## 2009-03-12 HISTORY — DX: Personal history of antineoplastic chemotherapy: Z92.21

## 2009-03-24 ENCOUNTER — Ambulatory Visit: Payer: Self-pay | Admitting: Internal Medicine

## 2009-03-28 LAB — COMPREHENSIVE METABOLIC PANEL
ALT: 11 U/L (ref 0–53)
Albumin: 4.1 g/dL (ref 3.5–5.2)
Alkaline Phosphatase: 79 U/L (ref 39–117)
CO2: 24 mEq/L (ref 19–32)
Potassium: 3.4 mEq/L — ABNORMAL LOW (ref 3.5–5.3)
Sodium: 143 mEq/L (ref 135–145)
Total Bilirubin: 0.4 mg/dL (ref 0.3–1.2)
Total Protein: 6.2 g/dL (ref 6.0–8.3)

## 2009-03-28 LAB — CBC WITH DIFFERENTIAL/PLATELET
BASO%: 0.1 % (ref 0.0–2.0)
EOS%: 0 % (ref 0.0–7.0)
LYMPH%: 9.8 % — ABNORMAL LOW (ref 14.0–49.0)
MCHC: 32.5 g/dL (ref 32.0–36.0)
MCV: 93.8 fL (ref 79.3–98.0)
MONO%: 5.8 % (ref 0.0–14.0)
Platelets: 345 10*3/uL (ref 140–400)
RBC: 3.71 10*6/uL — ABNORMAL LOW (ref 4.20–5.82)
RDW: 18.3 % — ABNORMAL HIGH (ref 11.0–14.6)
nRBC: 0 % (ref 0–0)

## 2009-04-12 ENCOUNTER — Ambulatory Visit (HOSPITAL_COMMUNITY): Admission: RE | Admit: 2009-04-12 | Discharge: 2009-04-12 | Payer: Self-pay | Admitting: Internal Medicine

## 2009-04-25 ENCOUNTER — Ambulatory Visit: Payer: Self-pay | Admitting: Internal Medicine

## 2009-04-25 LAB — CBC WITH DIFFERENTIAL/PLATELET
BASO%: 0.1 % (ref 0.0–2.0)
EOS%: 0 % (ref 0.0–7.0)
MCHC: 33.5 g/dL (ref 32.0–36.0)
MONO#: 1.2 10*3/uL — ABNORMAL HIGH (ref 0.1–0.9)
RBC: 3.4 10*6/uL — ABNORMAL LOW (ref 4.20–5.82)
WBC: 15 10*3/uL — ABNORMAL HIGH (ref 4.0–10.3)
lymph#: 1.5 10*3/uL (ref 0.9–3.3)
nRBC: 0 % (ref 0–0)

## 2009-04-25 LAB — COMPREHENSIVE METABOLIC PANEL
ALT: 8 U/L (ref 0–53)
Albumin: 3.8 g/dL (ref 3.5–5.2)
CO2: 27 mEq/L (ref 19–32)
Calcium: 8.9 mg/dL (ref 8.4–10.5)
Chloride: 99 mEq/L (ref 96–112)
Potassium: 3.1 mEq/L — ABNORMAL LOW (ref 3.5–5.3)
Sodium: 141 mEq/L (ref 135–145)
Total Protein: 6 g/dL (ref 6.0–8.3)

## 2009-05-16 LAB — CBC WITH DIFFERENTIAL/PLATELET
Basophils Absolute: 0 10*3/uL (ref 0.0–0.1)
EOS%: 0 % (ref 0.0–7.0)
HCT: 25.6 % — ABNORMAL LOW (ref 38.4–49.9)
HGB: 8.4 g/dL — ABNORMAL LOW (ref 13.0–17.1)
MCH: 32.3 pg (ref 27.2–33.4)
MCV: 98.5 fL — ABNORMAL HIGH (ref 79.3–98.0)
MONO%: 11.4 % (ref 0.0–14.0)
NEUT%: 76.6 % — ABNORMAL HIGH (ref 39.0–75.0)
Platelets: 344 10*3/uL (ref 140–400)

## 2009-05-23 LAB — CBC WITH DIFFERENTIAL/PLATELET
BASO%: 0.3 % (ref 0.0–2.0)
HCT: 29.7 % — ABNORMAL LOW (ref 38.4–49.9)
LYMPH%: 22.1 % (ref 14.0–49.0)
MCH: 33.1 pg (ref 27.2–33.4)
MCHC: 31.6 g/dL — ABNORMAL LOW (ref 32.0–36.0)
MONO#: 1.5 10*3/uL — ABNORMAL HIGH (ref 0.1–0.9)
NEUT%: 61 % (ref 39.0–75.0)
Platelets: 210 10*3/uL (ref 140–400)
WBC: 9.8 10*3/uL (ref 4.0–10.3)

## 2009-05-23 LAB — COMPREHENSIVE METABOLIC PANEL
AST: 27 U/L (ref 0–37)
BUN: 12 mg/dL (ref 6–23)
Calcium: 8.6 mg/dL (ref 8.4–10.5)
Chloride: 99 mEq/L (ref 96–112)
Creatinine, Ser: 1.63 mg/dL — ABNORMAL HIGH (ref 0.40–1.50)
Glucose, Bld: 92 mg/dL (ref 70–99)

## 2009-06-09 ENCOUNTER — Ambulatory Visit: Payer: Self-pay | Admitting: Internal Medicine

## 2009-06-13 ENCOUNTER — Encounter (HOSPITAL_COMMUNITY): Admission: RE | Admit: 2009-06-13 | Discharge: 2009-09-11 | Payer: Self-pay | Admitting: Internal Medicine

## 2009-06-13 LAB — CBC WITH DIFFERENTIAL/PLATELET
BASO%: 0.1 % (ref 0.0–2.0)
EOS%: 0 % (ref 0.0–7.0)
HCT: 18.3 % — ABNORMAL LOW (ref 38.4–49.9)
LYMPH%: 11.1 % — ABNORMAL LOW (ref 14.0–49.0)
MCH: 34.6 pg — ABNORMAL HIGH (ref 27.2–33.4)
MCHC: 33.9 g/dL (ref 32.0–36.0)
MCV: 102.2 fL — ABNORMAL HIGH (ref 79.3–98.0)
MONO%: 12.4 % (ref 0.0–14.0)
NEUT%: 76.4 % — ABNORMAL HIGH (ref 39.0–75.0)
Platelets: 263 10*3/uL (ref 140–400)
RBC: 1.79 10*6/uL — ABNORMAL LOW (ref 4.20–5.82)
WBC: 13.1 10*3/uL — ABNORMAL HIGH (ref 4.0–10.3)

## 2009-06-13 LAB — TYPE & CROSSMATCH - CHCC

## 2009-06-13 LAB — COMPREHENSIVE METABOLIC PANEL
ALT: <8 U/L (ref 0–53)
AST: 22 U/L (ref 0–37)
BUN: 15 mg/dL (ref 6–23)
Calcium: 8 mg/dL — ABNORMAL LOW (ref 8.4–10.5)
Creatinine, Ser: 2.37 mg/dL — ABNORMAL HIGH (ref 0.40–1.50)
Total Bilirubin: 0.5 mg/dL (ref 0.3–1.2)

## 2009-06-13 LAB — UA PROTEIN, DIPSTICK - CHCC: Protein, Urine: 30 mg/dL

## 2009-06-14 LAB — BASIC METABOLIC PANEL
BUN: 15 mg/dL (ref 6–23)
CO2: 31 mEq/L (ref 19–32)
Chloride: 100 mEq/L (ref 96–112)
Creatinine, Ser: 2.3 mg/dL — ABNORMAL HIGH (ref 0.40–1.50)
Potassium: 2.4 mEq/L — CL (ref 3.5–5.3)

## 2009-06-16 LAB — BASIC METABOLIC PANEL
BUN: 10 mg/dL (ref 6–23)
CO2: 29 mEq/L (ref 19–32)
Glucose, Bld: 148 mg/dL — ABNORMAL HIGH (ref 70–99)
Potassium: 2.8 mEq/L — ABNORMAL LOW (ref 3.5–5.3)
Sodium: 143 mEq/L (ref 135–145)

## 2009-06-16 LAB — MAGNESIUM: Magnesium: 1.5 mg/dL (ref 1.5–2.5)

## 2009-06-23 LAB — CBC WITH DIFFERENTIAL/PLATELET
BASO%: 0.5 % (ref 0.0–2.0)
Basophils Absolute: 0 10*3/uL (ref 0.0–0.1)
EOS%: 0.8 % (ref 0.0–7.0)
HCT: 27 % — ABNORMAL LOW (ref 38.4–49.9)
HGB: 9.4 g/dL — ABNORMAL LOW (ref 13.0–17.1)
LYMPH%: 26.3 % (ref 14.0–49.0)
MCH: 35.2 pg — ABNORMAL HIGH (ref 27.2–33.4)
MCHC: 34.7 g/dL (ref 32.0–36.0)
MCV: 101.4 fL — ABNORMAL HIGH (ref 79.3–98.0)
MONO%: 17.4 % — ABNORMAL HIGH (ref 0.0–14.0)
NEUT%: 55 % (ref 39.0–75.0)

## 2009-06-23 LAB — URIC ACID: Uric Acid, Serum: 5.3 mg/dL (ref 4.0–7.8)

## 2009-06-23 LAB — COMPREHENSIVE METABOLIC PANEL
AST: 40 U/L — ABNORMAL HIGH (ref 0–37)
Albumin: 3 g/dL — ABNORMAL LOW (ref 3.5–5.2)
Alkaline Phosphatase: 133 U/L — ABNORMAL HIGH (ref 39–117)
BUN: 15 mg/dL (ref 6–23)
Creatinine, Ser: 2.33 mg/dL — ABNORMAL HIGH (ref 0.40–1.50)
Glucose, Bld: 116 mg/dL — ABNORMAL HIGH (ref 70–99)
Total Bilirubin: 0.6 mg/dL (ref 0.3–1.2)

## 2009-06-23 LAB — MAGNESIUM: Magnesium: 1.5 mg/dL (ref 1.5–2.5)

## 2009-07-04 LAB — CBC WITH DIFFERENTIAL/PLATELET
BASO%: 0.4 % (ref 0.0–2.0)
Basophils Absolute: 0.1 10*3/uL (ref 0.0–0.1)
EOS%: 1.4 % (ref 0.0–7.0)
HGB: 9.7 g/dL — ABNORMAL LOW (ref 13.0–17.1)
MCH: 35.5 pg — ABNORMAL HIGH (ref 27.2–33.4)
MONO#: 1.2 10*3/uL — ABNORMAL HIGH (ref 0.1–0.9)
NEUT#: 10.8 10*3/uL — ABNORMAL HIGH (ref 1.5–6.5)
RDW: 22.3 % — ABNORMAL HIGH (ref 11.0–14.6)
WBC: 14.4 10*3/uL — ABNORMAL HIGH (ref 4.0–10.3)
lymph#: 2.2 10*3/uL (ref 0.9–3.3)

## 2009-07-04 LAB — COMPREHENSIVE METABOLIC PANEL
AST: 32 U/L (ref 0–37)
Albumin: 3.3 g/dL — ABNORMAL LOW (ref 3.5–5.2)
Alkaline Phosphatase: 242 U/L — ABNORMAL HIGH (ref 39–117)
Chloride: 109 mEq/L (ref 96–112)
Glucose, Bld: 102 mg/dL — ABNORMAL HIGH (ref 70–99)
Potassium: 3.9 mEq/L (ref 3.5–5.3)
Sodium: 140 mEq/L (ref 135–145)
Total Protein: 6.8 g/dL (ref 6.0–8.3)

## 2009-07-07 ENCOUNTER — Ambulatory Visit: Payer: Self-pay | Admitting: Internal Medicine

## 2009-07-07 ENCOUNTER — Inpatient Hospital Stay (HOSPITAL_COMMUNITY): Admission: EM | Admit: 2009-07-07 | Discharge: 2009-07-14 | Payer: Self-pay | Admitting: Internal Medicine

## 2009-07-07 LAB — COMPREHENSIVE METABOLIC PANEL
AST: 27 U/L (ref 0–37)
Alkaline Phosphatase: 180 U/L — ABNORMAL HIGH (ref 39–117)
BUN: 15 mg/dL (ref 6–23)
Creatinine, Ser: 3.13 mg/dL — ABNORMAL HIGH (ref 0.40–1.50)

## 2009-07-09 ENCOUNTER — Ambulatory Visit: Payer: Self-pay | Admitting: Oncology

## 2009-07-16 ENCOUNTER — Inpatient Hospital Stay (HOSPITAL_COMMUNITY): Admission: EM | Admit: 2009-07-16 | Discharge: 2009-07-20 | Payer: Self-pay | Admitting: Emergency Medicine

## 2009-07-16 ENCOUNTER — Ambulatory Visit: Payer: Self-pay | Admitting: Critical Care Medicine

## 2009-07-25 ENCOUNTER — Ambulatory Visit: Payer: Self-pay | Admitting: Internal Medicine

## 2009-07-27 LAB — CBC WITH DIFFERENTIAL/PLATELET
Basophils Absolute: 0.1 10*3/uL (ref 0.0–0.1)
Eosinophils Absolute: 0.5 10*3/uL (ref 0.0–0.5)
HGB: 11.6 g/dL — ABNORMAL LOW (ref 13.0–17.1)
MCV: 96.9 fL (ref 79.3–98.0)
MONO#: 1.1 10*3/uL — ABNORMAL HIGH (ref 0.1–0.9)
MONO%: 10.4 % (ref 0.0–14.0)
NEUT#: 6.1 10*3/uL (ref 1.5–6.5)
RDW: 16.6 % — ABNORMAL HIGH (ref 11.0–14.6)
WBC: 10.9 10*3/uL — ABNORMAL HIGH (ref 4.0–10.3)

## 2009-07-27 LAB — BASIC METABOLIC PANEL
BUN: 14 mg/dL (ref 6–23)
CO2: 20 mEq/L (ref 19–32)
Chloride: 112 mEq/L (ref 96–112)
Glucose, Bld: 148 mg/dL — ABNORMAL HIGH (ref 70–99)
Potassium: 5.3 mEq/L (ref 3.5–5.3)
Sodium: 139 mEq/L (ref 135–145)

## 2009-08-17 ENCOUNTER — Ambulatory Visit: Payer: Self-pay | Admitting: Internal Medicine

## 2009-08-17 LAB — COMPREHENSIVE METABOLIC PANEL
Albumin: 3 g/dL — ABNORMAL LOW (ref 3.5–5.2)
Alkaline Phosphatase: 111 U/L (ref 39–117)
BUN: 14 mg/dL (ref 6–23)
CO2: 25 mEq/L (ref 19–32)
Glucose, Bld: 100 mg/dL — ABNORMAL HIGH (ref 70–99)
Potassium: 3.6 mEq/L (ref 3.5–5.3)
Total Bilirubin: 0.4 mg/dL (ref 0.3–1.2)

## 2009-08-17 LAB — CBC WITH DIFFERENTIAL/PLATELET
Basophils Absolute: 0 10*3/uL (ref 0.0–0.1)
Eosinophils Absolute: 0.5 10*3/uL (ref 0.0–0.5)
HGB: 10.6 g/dL — ABNORMAL LOW (ref 13.0–17.1)
LYMPH%: 24.7 % (ref 14.0–49.0)
MCV: 94.6 fL (ref 79.3–98.0)
MONO%: 8.1 % (ref 0.0–14.0)
NEUT#: 6.2 10*3/uL (ref 1.5–6.5)
Platelets: 220 10*3/uL (ref 140–400)

## 2009-09-23 ENCOUNTER — Ambulatory Visit (HOSPITAL_COMMUNITY)
Admission: RE | Admit: 2009-09-23 | Discharge: 2009-09-23 | Payer: Self-pay | Source: Home / Self Care | Admitting: Internal Medicine

## 2009-09-26 ENCOUNTER — Ambulatory Visit: Payer: Self-pay | Admitting: Internal Medicine

## 2009-09-28 LAB — CBC WITH DIFFERENTIAL/PLATELET
Basophils Absolute: 0 10*3/uL (ref 0.0–0.1)
EOS%: 6.8 % (ref 0.0–7.0)
HGB: 10.1 g/dL — ABNORMAL LOW (ref 13.0–17.1)
MCH: 31.8 pg (ref 27.2–33.4)
MCV: 95.3 fL (ref 79.3–98.0)
MONO%: 23.8 % — ABNORMAL HIGH (ref 0.0–14.0)
NEUT%: 28.3 % — ABNORMAL LOW (ref 39.0–75.0)
RDW: 13.5 % (ref 11.0–14.6)

## 2009-09-28 LAB — COMPREHENSIVE METABOLIC PANEL
Alkaline Phosphatase: 76 U/L (ref 39–117)
BUN: 16 mg/dL (ref 6–23)
Creatinine, Ser: 2.03 mg/dL — ABNORMAL HIGH (ref 0.40–1.50)
Glucose, Bld: 208 mg/dL — ABNORMAL HIGH (ref 70–99)
Sodium: 141 mEq/L (ref 135–145)
Total Bilirubin: 0.5 mg/dL (ref 0.3–1.2)

## 2009-12-22 ENCOUNTER — Ambulatory Visit: Payer: Self-pay | Admitting: Internal Medicine

## 2009-12-26 ENCOUNTER — Ambulatory Visit (HOSPITAL_COMMUNITY): Admission: RE | Admit: 2009-12-26 | Discharge: 2009-12-26 | Payer: Self-pay | Admitting: Internal Medicine

## 2009-12-26 LAB — CBC WITH DIFFERENTIAL/PLATELET
BASO%: 0.5 % (ref 0.0–2.0)
Basophils Absolute: 0 10*3/uL (ref 0.0–0.1)
EOS%: 3.6 % (ref 0.0–7.0)
MCH: 33.4 pg (ref 27.2–33.4)
MCHC: 35.5 g/dL (ref 32.0–36.0)
MCV: 94.1 fL (ref 79.3–98.0)
MONO%: 10.2 % (ref 0.0–14.0)
RBC: 3.34 10*6/uL — ABNORMAL LOW (ref 4.20–5.82)
RDW: 14.3 % (ref 11.0–14.6)

## 2009-12-26 LAB — COMPREHENSIVE METABOLIC PANEL
AST: 17 U/L (ref 0–37)
Albumin: 3.7 g/dL (ref 3.5–5.2)
Alkaline Phosphatase: 94 U/L (ref 39–117)
BUN: 18 mg/dL (ref 6–23)
Potassium: 3.8 mEq/L (ref 3.5–5.3)
Sodium: 142 mEq/L (ref 135–145)

## 2010-04-02 ENCOUNTER — Encounter: Payer: Self-pay | Admitting: Family Medicine

## 2010-04-03 ENCOUNTER — Encounter: Payer: Self-pay | Admitting: Internal Medicine

## 2010-04-06 ENCOUNTER — Ambulatory Visit: Payer: Self-pay | Admitting: Internal Medicine

## 2010-04-10 ENCOUNTER — Ambulatory Visit (HOSPITAL_COMMUNITY): Admission: RE | Admit: 2010-04-10 | Payer: Self-pay | Source: Home / Self Care | Admitting: Internal Medicine

## 2010-04-13 ENCOUNTER — Other Ambulatory Visit: Payer: Self-pay | Admitting: Internal Medicine

## 2010-04-13 DIAGNOSIS — C3490 Malignant neoplasm of unspecified part of unspecified bronchus or lung: Secondary | ICD-10-CM

## 2010-04-15 ENCOUNTER — Encounter: Payer: Self-pay | Admitting: Internal Medicine

## 2010-05-03 ENCOUNTER — Other Ambulatory Visit (HOSPITAL_COMMUNITY): Payer: Medicare Other

## 2010-05-05 ENCOUNTER — Ambulatory Visit (HOSPITAL_COMMUNITY)
Admission: RE | Admit: 2010-05-05 | Discharge: 2010-05-05 | Disposition: A | Discharge status: Home / Self Care | Payer: Medicare Other | Source: Ambulatory Visit | Attending: Internal Medicine | Admitting: Internal Medicine

## 2010-05-05 ENCOUNTER — Encounter (HOSPITAL_BASED_OUTPATIENT_CLINIC_OR_DEPARTMENT_OTHER): Payer: Medicare Other | Admitting: Internal Medicine

## 2010-05-05 ENCOUNTER — Other Ambulatory Visit: Payer: Self-pay | Admitting: Internal Medicine

## 2010-05-05 DIAGNOSIS — J984 Other disorders of lung: Secondary | ICD-10-CM | POA: Insufficient documentation

## 2010-05-05 DIAGNOSIS — C3490 Malignant neoplasm of unspecified part of unspecified bronchus or lung: Secondary | ICD-10-CM | POA: Insufficient documentation

## 2010-05-05 DIAGNOSIS — N289 Disorder of kidney and ureter, unspecified: Secondary | ICD-10-CM

## 2010-05-05 DIAGNOSIS — J90 Pleural effusion, not elsewhere classified: Secondary | ICD-10-CM | POA: Insufficient documentation

## 2010-05-05 DIAGNOSIS — C3430 Malignant neoplasm of lower lobe, unspecified bronchus or lung: Secondary | ICD-10-CM

## 2010-05-05 LAB — CBC WITH DIFFERENTIAL/PLATELET
BASO%: 0.3 % (ref 0.0–2.0)
Eosinophils Absolute: 0.2 10*3/uL (ref 0.0–0.5)
MCHC: 34.4 g/dL (ref 32.0–36.0)
MCV: 92.5 fL (ref 79.3–98.0)
MONO#: 0.6 10*3/uL (ref 0.1–0.9)
MONO%: 6 % (ref 0.0–14.0)
NEUT#: 7.6 10*3/uL — ABNORMAL HIGH (ref 1.5–6.5)
RBC: 3.87 10*6/uL — ABNORMAL LOW (ref 4.20–5.82)
RDW: 14.2 % (ref 11.0–14.6)
WBC: 10.7 10*3/uL — ABNORMAL HIGH (ref 4.0–10.3)

## 2010-05-05 LAB — COMPREHENSIVE METABOLIC PANEL
ALT: 12 U/L (ref 0–53)
Albumin: 3.9 g/dL (ref 3.5–5.2)
Alkaline Phosphatase: 79 U/L (ref 39–117)
Glucose, Bld: 102 mg/dL — ABNORMAL HIGH (ref 70–99)
Potassium: 3.7 mEq/L (ref 3.5–5.3)
Sodium: 140 mEq/L (ref 135–145)
Total Bilirubin: 0.5 mg/dL (ref 0.3–1.2)
Total Protein: 6.4 g/dL (ref 6.0–8.3)

## 2010-05-09 ENCOUNTER — Other Ambulatory Visit: Payer: Self-pay | Admitting: Internal Medicine

## 2010-05-09 ENCOUNTER — Encounter (HOSPITAL_BASED_OUTPATIENT_CLINIC_OR_DEPARTMENT_OTHER): Payer: Medicare Other | Admitting: Internal Medicine

## 2010-05-09 DIAGNOSIS — N289 Disorder of kidney and ureter, unspecified: Secondary | ICD-10-CM

## 2010-05-09 DIAGNOSIS — C3490 Malignant neoplasm of unspecified part of unspecified bronchus or lung: Secondary | ICD-10-CM

## 2010-05-09 DIAGNOSIS — C3430 Malignant neoplasm of lower lobe, unspecified bronchus or lung: Secondary | ICD-10-CM

## 2010-05-30 LAB — BASIC METABOLIC PANEL
BUN: 17 mg/dL (ref 6–23)
BUN: 12 mg/dL (ref 6–23)
CO2: 22 mEq/L (ref 19–32)
CO2: 24 mEq/L (ref 19–32)
CO2: 24 mEq/L (ref 19–32)
CO2: 25 mEq/L (ref 19–32)
Calcium: 8.1 mg/dL — ABNORMAL LOW (ref 8.4–10.5)
Calcium: 8.3 mg/dL — ABNORMAL LOW (ref 8.4–10.5)
Chloride: 112 mEq/L (ref 96–112)
Chloride: 112 mEq/L (ref 96–112)
Chloride: 113 mEq/L — ABNORMAL HIGH (ref 96–112)
Chloride: 114 mEq/L — ABNORMAL HIGH (ref 96–112)
Chloride: 114 mEq/L — ABNORMAL HIGH (ref 96–112)
Chloride: 115 mEq/L — ABNORMAL HIGH (ref 96–112)
Chloride: 125 mEq/L — ABNORMAL HIGH (ref 96–112)
Chloride: 113 mEq/L — ABNORMAL HIGH (ref 96–112)
Creatinine, Ser: 2.23 mg/dL — ABNORMAL HIGH (ref 0.4–1.5)
Creatinine, Ser: 2.51 mg/dL — ABNORMAL HIGH (ref 0.4–1.5)
Creatinine, Ser: 2.72 mg/dL — ABNORMAL HIGH (ref 0.4–1.5)
GFR calc Af Amer: 29 mL/min — ABNORMAL LOW (ref >60)
GFR calc Af Amer: 32 mL/min — ABNORMAL LOW (ref >60)
GFR calc Af Amer: 33 mL/min — ABNORMAL LOW (ref >60)
GFR calc Af Amer: 37 mL/min — ABNORMAL LOW (ref >60)
GFR calc Af Amer: 38 mL/min — ABNORMAL LOW (ref >60)
GFR calc non Af Amer: 26 mL/min — ABNORMAL LOW (ref >60)
GFR calc non Af Amer: 32 mL/min — ABNORMAL LOW (ref >60)
Glucose, Bld: 90 mg/dL (ref 70–99)
Glucose, Bld: 90 mg/dL (ref 70–99)
Glucose, Bld: 90 mg/dL (ref 70–99)
Potassium: 2.2 mEq/L — CL (ref 3.5–5.1)
Potassium: 2.8 mEq/L — ABNORMAL LOW (ref 3.5–5.1)
Potassium: 3 mEq/L — ABNORMAL LOW (ref 3.5–5.1)
Potassium: 3.1 mEq/L — ABNORMAL LOW (ref 3.5–5.1)
Potassium: 3.2 mEq/L — ABNORMAL LOW (ref 3.5–5.1)
Potassium: 3.8 mEq/L (ref 3.5–5.1)
Sodium: 142 mEq/L (ref 135–145)
Sodium: 143 mEq/L (ref 135–145)
Sodium: 144 mEq/L (ref 135–145)
Sodium: 144 mEq/L (ref 135–145)
Sodium: 145 mEq/L (ref 135–145)
Sodium: 152 mEq/L — ABNORMAL HIGH (ref 135–145)

## 2010-05-30 LAB — COMPREHENSIVE METABOLIC PANEL
ALT: 10 U/L (ref 0–53)
ALT: <8 U/L (ref 0–53)
ALT: 9 U/L (ref 0–53)
AST: 21 U/L (ref 0–37)
AST: 26 U/L (ref 0–37)
AST: 34 U/L (ref 0–37)
AST: 24 U/L (ref 0–37)
Albumin: 2.2 g/dL — ABNORMAL LOW (ref 3.5–5.2)
Alkaline Phosphatase: 137 U/L — ABNORMAL HIGH (ref 39–117)
BUN: 21 mg/dL (ref 6–23)
BUN: 24 mg/dL — ABNORMAL HIGH (ref 6–23)
CO2: 21 mEq/L (ref 19–32)
CO2: 23 mEq/L (ref 19–32)
CO2: 24 mEq/L (ref 19–32)
CO2: 26 mEq/L (ref 19–32)
CO2: 27 mEq/L (ref 19–32)
Calcium: 8 mg/dL — ABNORMAL LOW (ref 8.4–10.5)
Calcium: 8.8 mg/dL (ref 8.4–10.5)
Calcium: 8.9 mg/dL (ref 8.4–10.5)
Calcium: 9 mg/dL (ref 8.4–10.5)
Chloride: 112 mEq/L (ref 96–112)
Creatinine, Ser: 2.46 mg/dL — ABNORMAL HIGH (ref 0.4–1.5)
Creatinine, Ser: 2.61 mg/dL — ABNORMAL HIGH (ref 0.4–1.5)
Creatinine, Ser: 2.88 mg/dL — ABNORMAL HIGH (ref 0.4–1.5)
Creatinine, Ser: 3.25 mg/dL — ABNORMAL HIGH (ref 0.4–1.5)
GFR calc Af Amer: 28 mL/min — ABNORMAL LOW (ref >60)
GFR calc Af Amer: 31 mL/min — ABNORMAL LOW (ref >60)
GFR calc Af Amer: 33 mL/min — ABNORMAL LOW (ref >60)
GFR calc Af Amer: 35 mL/min — ABNORMAL LOW (ref >60)
GFR calc Af Amer: 30 mL/min — ABNORMAL LOW (ref >60)
GFR calc non Af Amer: 23 mL/min — ABNORMAL LOW (ref >60)
GFR calc non Af Amer: 26 mL/min — ABNORMAL LOW (ref >60)
GFR calc non Af Amer: 28 mL/min — ABNORMAL LOW (ref >60)
GFR calc non Af Amer: 29 mL/min — ABNORMAL LOW (ref >60)
GFR calc non Af Amer: 25 mL/min — ABNORMAL LOW (ref >60)
Glucose, Bld: 108 mg/dL — ABNORMAL HIGH (ref 70–99)
Glucose, Bld: 143 mg/dL — ABNORMAL HIGH (ref 70–99)
Potassium: 3.6 mEq/L (ref 3.5–5.1)
Potassium: 3.7 mEq/L (ref 3.5–5.1)
Sodium: 145 mEq/L (ref 135–145)
Sodium: 147 mEq/L — ABNORMAL HIGH (ref 135–145)
Sodium: 144 mEq/L (ref 135–145)
Total Bilirubin: 0.3 mg/dL (ref 0.3–1.2)
Total Bilirubin: 0.5 mg/dL (ref 0.3–1.2)
Total Protein: 4.4 g/dL — ABNORMAL LOW (ref 6.0–8.3)
Total Protein: 6 g/dL (ref 6.0–8.3)
Total Protein: 6 g/dL (ref 6.0–8.3)

## 2010-05-30 LAB — ANAEROBIC CULTURE

## 2010-05-30 LAB — CBC
HCT: 26.2 % — ABNORMAL LOW (ref 39.0–52.0)
HCT: 26.3 % — ABNORMAL LOW (ref 39.0–52.0)
HCT: 27 % — ABNORMAL LOW (ref 39.0–52.0)
HCT: 21.4 % — ABNORMAL LOW (ref 39.0–52.0)
HCT: 28.2 % — ABNORMAL LOW (ref 39.0–52.0)
Hemoglobin: 8.7 g/dL — ABNORMAL LOW (ref 13.0–17.0)
Hemoglobin: 9 g/dL — ABNORMAL LOW (ref 13.0–17.0)
Hemoglobin: 9.3 g/dL — ABNORMAL LOW (ref 13.0–17.0)
MCHC: 33.8 g/dL (ref 30.0–36.0)
MCHC: 34.2 g/dL (ref 30.0–36.0)
MCHC: 34.4 g/dL (ref 30.0–36.0)
MCHC: 34.5 g/dL (ref 30.0–36.0)
MCHC: 35 g/dL (ref 30.0–36.0)
MCHC: 34 g/dL (ref 30.0–36.0)
MCV: 97.5 fL (ref 78.0–100.0)
MCV: 97.9 fL (ref 78.0–100.0)
MCV: 98.7 fL (ref 78.0–100.0)
MCV: 98.7 fL (ref 78.0–100.0)
MCV: 98.9 fL (ref 78.0–100.0)
MCV: 99.7 fL (ref 78.0–100.0)
MCV: 101.8 fL — ABNORMAL HIGH (ref 78.0–100.0)
MCV: 98.3 fL (ref 78.0–100.0)
Platelets: 129 10*3/uL — ABNORMAL LOW (ref 150–400)
Platelets: 142 10*3/uL — ABNORMAL LOW (ref 150–400)
Platelets: 119 10*3/uL — ABNORMAL LOW (ref 150–400)
Platelets: 119 10*3/uL — ABNORMAL LOW (ref 150–400)
RBC: 2.53 MIL/uL — ABNORMAL LOW (ref 4.22–5.81)
RBC: 2.67 MIL/uL — ABNORMAL LOW (ref 4.22–5.81)
RBC: 3.49 MIL/uL — ABNORMAL LOW (ref 4.22–5.81)
RBC: 3.73 MIL/uL — ABNORMAL LOW (ref 4.22–5.81)
RBC: 2.1 MIL/uL — ABNORMAL LOW (ref 4.22–5.81)
RDW: 18.5 % — ABNORMAL HIGH (ref 11.5–15.5)
RDW: 18.7 % — ABNORMAL HIGH (ref 11.5–15.5)
RDW: 20.3 % — ABNORMAL HIGH (ref 11.5–15.5)
RDW: 20.3 % — ABNORMAL HIGH (ref 11.5–15.5)
RDW: 21.3 % — ABNORMAL HIGH (ref 11.5–15.5)
WBC: 10.2 10*3/uL (ref 4.0–10.5)
WBC: 10.3 10*3/uL (ref 4.0–10.5)
WBC: 15.7 10*3/uL — ABNORMAL HIGH (ref 4.0–10.5)
WBC: 9.4 10*3/uL (ref 4.0–10.5)

## 2010-05-30 LAB — CREATININE, URINE, RANDOM: Creatinine, Urine: 25.3 mg/dL

## 2010-05-30 LAB — URINALYSIS, ROUTINE W REFLEX MICROSCOPIC
Bilirubin Urine: NEGATIVE
Ketones, ur: NEGATIVE mg/dL
Nitrite: NEGATIVE
Protein, ur: 30 mg/dL — AB
Specific Gravity, Urine: 1.009 (ref 1.005–1.030)
Urobilinogen, UA: 0.2 mg/dL (ref 0.0–1.0)
pH: 7 (ref 5.0–8.0)
pH: 7 (ref 5.0–8.0)

## 2010-05-30 LAB — C3 COMPLEMENT: C3 Complement: 107 mg/dL (ref 88–201)

## 2010-05-30 LAB — CROSSMATCH

## 2010-05-30 LAB — FUNGUS CULTURE W SMEAR: Fungal Smear: NONE SEEN

## 2010-05-30 LAB — DIFFERENTIAL
Basophils Absolute: 0 10*3/uL (ref 0.0–0.1)
Basophils Absolute: 0.1 10*3/uL (ref 0.0–0.1)
Basophils Absolute: 0.1 10*3/uL (ref 0.0–0.1)
Basophils Relative: 1 % (ref 0–1)
Eosinophils Absolute: 0.2 10*3/uL (ref 0.0–0.7)
Eosinophils Relative: 3 % (ref 0–5)
Eosinophils Relative: 2 % (ref 0–5)
Lymphocytes Relative: 20 % (ref 12–46)
Lymphocytes Relative: 8 % — ABNORMAL LOW (ref 12–46)
Lymphs Abs: 1.2 10*3/uL (ref 0.7–4.0)
Lymphs Abs: 2 10*3/uL (ref 0.7–4.0)
Lymphs Abs: 2 10*3/uL (ref 0.7–4.0)
Lymphs Abs: 2.2 10*3/uL (ref 0.7–4.0)
Monocytes Absolute: 1.1 10*3/uL — ABNORMAL HIGH (ref 0.1–1.0)
Monocytes Relative: 8 % (ref 3–12)
Neutro Abs: 6.6 10*3/uL (ref 1.7–7.7)
Neutrophils Relative %: 67 % (ref 43–77)
Neutrophils Relative %: 84 % — ABNORMAL HIGH (ref 43–77)

## 2010-05-30 LAB — URINE CULTURE: Colony Count: NO GROWTH

## 2010-05-30 LAB — PROTEIN, URINE, 24 HOUR
Collection Interval-UPROT: 24 hours
Protein, 24H Urine: 2170 mg/d — ABNORMAL HIGH (ref 50–100)
Urine Total Volume-UPROT: 6200 mL

## 2010-05-30 LAB — CULTURE, BLOOD (ROUTINE X 2): Culture: NO GROWTH

## 2010-05-30 LAB — POCT I-STAT 3, ART BLOOD GAS (G3+)
Bicarbonate: 29.8 mEq/L — ABNORMAL HIGH (ref 20.0–24.0)
TCO2: 31 mmol/L (ref 0–100)
pCO2 arterial: 49.7 mmHg — ABNORMAL HIGH (ref 35.0–45.0)
pH, Arterial: 7.385 (ref 7.350–7.450)
pO2, Arterial: 357 mmHg — ABNORMAL HIGH (ref 80.0–100.0)

## 2010-05-30 LAB — CK TOTAL AND CKMB (NOT AT ARMC)
CK, MB: 0.7 ng/mL (ref 0.3–4.0)
CK, MB: 0.8 ng/mL (ref 0.3–4.0)
Total CK: 20 U/L (ref 7–232)

## 2010-05-30 LAB — TROPONIN I: Troponin I: 0.09 ng/mL — ABNORMAL HIGH (ref 0.00–0.06)

## 2010-05-30 LAB — CSF CELL COUNT WITH DIFFERENTIAL
RBC Count, CSF: 1 /mm3 — ABNORMAL HIGH
Tube #: 2

## 2010-05-30 LAB — BLOOD GAS, ARTERIAL
Bicarbonate: 22.6 mEq/L (ref 20.0–24.0)
PEEP: 5 cmH2O
Patient temperature: 98.6
RATE: 14 resp/min
pH, Arterial: 7.484 — ABNORMAL HIGH (ref 7.350–7.450)
pO2, Arterial: 60.2 mmHg — ABNORMAL LOW (ref 80.0–100.0)

## 2010-05-30 LAB — CRYPTOCOCCAL ANTIGEN, CSF: Crypto Ag: NEGATIVE

## 2010-05-30 LAB — CULTURE, RESPIRATORY W GRAM STAIN

## 2010-05-30 LAB — UREA NITROGEN, URINE: Urea Nitrogen, Ur: 91 mg/dL

## 2010-05-30 LAB — PROTEIN AND GLUCOSE, CSF: Total  Protein, CSF: 87 mg/dL — ABNORMAL HIGH (ref 15–45)

## 2010-05-30 LAB — CSF CULTURE W GRAM STAIN: Culture: NO GROWTH

## 2010-05-30 LAB — PHENYTOIN LEVEL, TOTAL: Phenytoin Lvl: 3.2 ug/mL — ABNORMAL LOW (ref 10.0–20.0)

## 2010-05-30 LAB — BRAIN NATRIURETIC PEPTIDE: Pro B Natriuretic peptide (BNP): 60 pg/mL (ref 0.0–100.0)

## 2010-05-30 LAB — PROTEIN / CREATININE RATIO, URINE
Creatinine, Urine: 22.9 mg/dL
Total Protein, Urine: 41 mg/dL

## 2010-05-30 LAB — URINE MICROSCOPIC-ADD ON

## 2010-05-30 LAB — PROTIME-INR
INR: 1.24 (ref 0.00–1.49)
Prothrombin Time: 15.5 seconds — ABNORMAL HIGH (ref 11.6–15.2)

## 2010-05-30 LAB — POCT CARDIAC MARKERS
CKMB, poc: <1 ng/mL — ABNORMAL LOW (ref 1.0–8.0)
Myoglobin, poc: 171 ng/mL (ref 12–200)

## 2010-05-30 LAB — CK: Total CK: 25 U/L (ref 7–232)

## 2010-05-30 LAB — PHOSPHORUS: Phosphorus: 3.7 mg/dL (ref 2.3–4.6)

## 2010-05-31 LAB — CROSSMATCH

## 2010-07-25 NOTE — Assessment & Plan Note (Signed)
OFFICE VISIT   [name] [name], [name] [name]  DOB:  02-05-1956                                        August 25, 2007  CHART #:  [id number]   HISTORY OF PRESENT ILLNESS:  This patient returns for followup status  post left supraclavicular lymph node biopsy on 06/09/ 2009.  His open  biopsy was found to be consistent with metastatic non-small cell  carcinoma of the lung, likely adenocarcinoma primary.  He subsequently  underwent MRI of the brain and a PET scan for further staging.  MRI of  the brain was normal.  PET scan revealed peripheral right middle lobe  pulmonary mass with hypermetabolic activity consistent with primary  bronchogenic carcinoma as well as scattered nodular densities throughout  the right upper lobe and right lower lobe, possible pleural metastasis  in the right lower lobe, and extensive nodal metastases including  bilateral deep neck regions, the left supraclavicular nodal station, the  left axillary nodal station, and both left and right mediastinal nodal  stations.  There was no evidence for disease below the diaphragm.  The  patient was seen in consultation by Dr. Arbutus Ped from the Medical  Oncology team earlier today.  They have made plans to begin systemic  chemotherapy.  The patient has now been referred for Port-A-Cath  placement.  The remainder of his past medical history and review of  systems are unchanged.  He does still have some soreness in the left  anterior neck, related to his open biopsy.   PHYSICAL EXAMINATION:  Physical exam is notable for a well-appearing  male with blood pressure 164/91, pulse 88, and oxygen saturation 94% on  room air.  The incision in the left supraclavicular fossa is healing  nicely.  Breath sounds are clear to auscultation.  Cardiovascular:  Notable for regular rate and rhythm.  Abdomen:  Soft, nontender.  Extremities:  Warm and well perfused.   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  The patient plans to proceed with systemic chemotherapy for primary  treatment.   PLAN:  We will plan to proceed with Port-A-Cath placement this week.  He  understands there is small risk associated with Port-A-Cath placement,  including the risk of pneumothorax, bleeding, and delayed catheter-  related infection.  All of his questions have been addressed.   Salvatore Decent. Cornelius Moras, M.D.  Electronically Signed   CHO/MEDQ  D:  08/25/2007  T:  08/26/2007  Job:  [id number]   cc:   Lajuana Matte, MD

## 2010-07-25 NOTE — Assessment & Plan Note (Signed)
OFFICE VISIT   SAMNANG, SHUGARS  DOB:  08/21/1955                                        September 08, 2007  CHART #:  [id number]   HISTORY OF PRESENT ILLNESS:  The patient returns for followup related to  recent Port-A-Cath placement.  His initial procedure was entirely  uncomplicated, and postoperative chest x-ray demonstrated normal  position of the Port-A-Cath entering via the right subclavian vein.  Last week, he returned to the Medical Oncology Clinic to start  chemotherapy.  Chest x-ray performed at that time demonstrated that the  catheter had migrated backwards up the internal jugular vein.  The  patient was given chemotherapy via a peripheral IV.  In addition, chest  x-ray performed at that time demonstrated a large right pleural  effusion.  He underwent thoracentesis and 1.5 L of fluid was evacuated.  He tolerated his chemo well and he returns to our office today for  followup related to his malpositioned Port-A-Cath.  The remainder of his  review of systems is unrevealing, and he reports his breathing is  stable.  The patient denies any episodes of prolonged nausea, vomiting,  retching, or severe coughing paroxysms that might have contributed to  the movement of the Port-A-Cath.   PHYSICAL EXAMINATION:  GENERAL:  Notable for well-appearing male with  blood pressure of 104/72, pulse 92, and oxygen saturation 93% on room  air.  Surgical incision from Port-A-Cath placement is healing nicely.  CHEST:  Auscultation of the chest reveals slightly diminished breath  sounds on the right compared to the left, but overall breath sounds are  fairly clear.   The remainder of his physical exam is unrevealing.   DIAGNOSTIC TEST:  Chest x-ray obtained today at the [facility] is reviewed.  This demonstrates the catheter remains in similar  position, looping backwards up the internal jugular vein.  The right  pleural effusion has not recurred, and overall  there is normal expansion  of the right lung.  There remains parenchymal opacities in the right  lung consistent with the patient's underlying known lung cancer.   IMPRESSION:  Malposition of Port-A-Cath which previously was placed via  the right subclavian approach, now with catheter looping up the internal  jugular vein.  The patient also has not had any substantial  reaccumulation of his right pleural effusion.   PLAN:  We will plan to replace the patient's Port-A-Cath in the  operating room tomorrow.  We will remove the existing catheter  completely and place a new one via the internal jugular approach to  prevent similar complication from happening again.  I do not feel that  we should proceed with that for talc pleurodesis unless the patient  demonstrates reaccumulation of his right pleural effusion.  The  indications, risks, and potential benefits of surgery have been  discussed and all of the patient's questions have been answered.   Salvatore Decent. Cornelius Moras, M.D.  Electronically Signed   CHO/MEDQ  D:  09/08/2007  T:  09/09/2007  Job:  [id number]   cc:   Lajuana Matte, MD

## 2010-07-25 NOTE — Op Note (Signed)
NAME:  [name] [name], [name] [name]                   ACCOUNT NO.:  [id number]   MEDICAL RECORD NO.:  [id number]          PATIENT TYPE:  AMB   LOCATION:  SDS                          FACILITY:  [facility]   PHYSICIAN:  Salvatore Decent. Cornelius Moras, M.D. DATE OF BIRTH:  [birth year]   DATE OF PROCEDURE:  08/28/2007  DATE OF DISCHARGE:  08/28/2007                               OPERATIVE REPORT   PREOPERATIVE DIAGNOSIS:  Lung cancer.   POSTOPERATIVE DIAGNOSIS:  Lung cancer.   PROCEDURE:  Placement of right subclavian subcutaneous Port-A-Cath.   SURGEON:  Salvatore Decent. Cornelius Moras, MD.   ANESTHESIA:  1% lidocaine with intravenous sedation and monitored  anesthesia care.   OPERATIVE NOTE IN DETAIL:  The patient was brought to the operating room  on the above-mentioned date and placed in supine position on the  operating table with the patient's head turned to the right side.  Intravenous sedation was administered by the Anesthesia Team.  The  patient's anterior chest and neck were prepared and draped in a sterile  manner.  Lidocaine 1% solution was utilized to anesthetize the skin and  subcutaneous tissues in the right delta pectoral groove and along the  right anterior chest wall.  A small stab incision is made in the  deltopectoral groove.  The right subclavian vein is cannulated using a  Seldinger technique.  Position of the guidewire is verified using  fluoroscopy.  A small incision is made overlying the right anterior  chest wall.  The incision is completed through the subcutaneous tissues  with electrocautery and a subcutaneous pocket was created.  A Bard  PowerPort pre-attached 9-French infusion port is subsequently tunneled  through the subcutaneous tissues to position it in the subcutaneous  pocket.  The introducing sheath and dilator are then advanced over the  guidewire into the subclavian vein and position again verified with  fluoroscopy.  The catheter was trimmed to an appropriate length and  subsequently passed  through the introducing sheath after the dilator has  been removed.  Final position of the catheter is verified with  fluoroscopy.  The catheter was flushed initially with heparin flush and  subsequently flushed with straight heparin solution to leave the  indwelling catheter.  The port is secured in place in the subcutaneous  pocket with two 3-0 Prolene sutures.  The pocket was irrigated with  saline solution.  Subsequently closed multiple layers with absorbable  suture.  The skin is closed with subcuticular skin closure.  Dermabond  was applied to both incisions.  The patient tolerated the procedure well  and was transported back to the Post Anesthesia Care Unit in stable  condition.      Salvatore Decent. Cornelius Moras, M.D.  Electronically Signed     CHO/MEDQ  D:  08/28/2007  T:  08/29/2007  Job:  [id number]

## 2010-07-25 NOTE — H&P (Signed)
HISTORY AND PHYSICAL EXAMINATION   August 18, 2007   Re:  [name] [name], [name] [name]         DOB:  04-04-1955   REASON FOR CONSULTATION:  Right lung mass with mediastinal  lymphadenopathy, left supraclavicular mass, and left axillary mass.   HISTORY OF PRESENT ILLNESS:  The patient is a 55 year old male with  longstanding history of tobacco abuse who has otherwise been reasonably  healthy in his usual state of health.  He presented recently to  [facility] with a 3-week history of an enlarged mass in  the left supraclavicular fossa associated with mild soreness in the  neck.  He states that he first appreciated this about 3 weeks ago.  He  had had a dental abscess recently and thought perhaps he had an inflamed  lymph node in this region.  However, this enlarged to be quite  prominent, ultimately prompting him to present to his primary care  physician.  He was sent for a CT scan of the neck and chest.  This was  performed on August 11, 2007, at [facility].  Findings  were notable for multiple enlarged lymph nodes in the supraclavicular  region on the left side.  There was no other sign of any enhancing mass  within the soft tissues of the neck.  The thyroid gland appeared normal.  Chest CT scan was notable for small mass in the right lower lobe  suspicious for primary lung cancer.  There were numeral other tiny  nodules throughout the right lung parenchyma, there was an enlarged left  axillary lymph node, and there were multiple enlarged mediastinal and  right hilar lymph nodes.  The patient was referred for thoracic surgical  consultation.   REVIEW OF SYSTEMS:  General:  The patient overall feels well, although  he has been under a lot of stress and has been quite anxious recently as  he has recently lost his job.  He has lost 50 pounds in weight over the  last 6 months, although he states this has been intentional on the diet.  He reports a normal  appetite.  He reports recent persistent productive  cough.  His cough is productive of intermittent clear, frothy sputum.  He denies purulent sputum production or hemoptysis.  He reports no chest  pain.  He has had some mild pressure and tightness in his neck.  He has  had no difficulty swallowing.  He has had no change in voice.  He has  had no pain with inspiration.  He has no difficulty swallowing.  He has  no shortness of breath.  He had a recent abscess in his tooth on the  left side, and this was treated with a course of penicillin and has  apparently resolved.  He denies any fevers or chills.  The remainder of  his review of systems is unremarkable.   PAST MEDICAL HISTORY:  1. Hypertension.  2. Cluster headaches.  3. Longstanding tobacco abuse.   PAST SURGICAL HISTORY:  None.   FAMILY HISTORY:  The patient's brother has been treated for throat  cancer.   SOCIAL HISTORY:  The patient is single and lives in [place].  He was  recently laid off.  He remains active physically.  He has a longstanding  history of heavy tobacco abuse, smoking more than 2 packs of cigarettes  per day for more than 20 years.  He claims to be stopping smoking  immediately.  He reports no significant  alcohol consumption.  He denies  any history of illicit drug use.   CURRENT MEDICATIONS:  1. Verapamil 240 mg daily.  2. Zoloft 200 mg daily.   DRUG ALLERGIES:  None known.   PHYSICAL EXAMINATION:  The patient is a well-appearing male who appears  his stated age in no acute distress.  Blood pressure is 163/96, pulse  86, and oxygen saturation 96% on room air.  HEENT:  Unremarkable.  There  is no palpable cervical lymphadenopathy.  The left supraclavicular fossa  is quite full.  There is no tenderness.  One can appreciate soft tissue  density in this region that likely represents lymphadenopathy, although  discrete mass cannot be appreciated.  I do not palpate anything in the  right supraclavicular  fossa.  There is a mildly tender, enlarged lymph  node in the left axilla.  Auscultation of the chest reveals clear breath  sounds, but no wheezes or rhonchi.  Breath sounds are symmetrical.  Cardiovascular:  Reveals regular rate and rhythm.  No murmurs, rubs, or  gallops.  The abdomen is soft and nontender.  There are no palpable  masses.  Extremities:  Warm and well-perfused.  Distal pulses are  intact.  Rectal and GU exams are both deferred.  Neurologic:  Grossly  nonfocal.  Of note, the patient does appear to have slight facial droop  on the right side, but the patient states that this has been present all  of his life.   DIAGNOSTIC TESTS:  Chest and neck CT scan performed on August 11, 2007, at  [facility] is reviewed.  Findings are as discussed  previously.  I am highly suspicious that this represents primary lung  cancer with likely metastasis, small cell carcinoma or adenocarcinoma  would be likely potential culprits.   IMPRESSION:  Right lung mass with hilar and mediastinal lymphadenopathy,  left supraclavicular mass likely representing enlarged lymph node, and  left axillary mass.  I am highly suspicious of primary lung cancer with  metastasis.  I suspect that supraclavicular lymph node biopsy would be  the easiest most direct means to obtain both staging and definitive  tissue for diagnosis.   PLAN:  I have discussed options at length with the patient and his  brother here in the office today.  We plan to proceed with open biopsy  of left supraclavicular mass in the operating room tomorrow.  If this is  non-yielding, we will proceed with axillary lymph node biopsy.  If  neither of these provide definitive tissue diagnosis, we will revisit  possible transthoracic needle aspiration biopsy of the right lung mass  at a later time.  We will also go ahead and schedule the patient for MRI  of the brain and PET scan for further staging information.   Salvatore Decent.  Cornelius Moras, M.D.  Electronically Signed   CHO/MEDQ  D:  08/18/2007  T:  08/19/2007  Job:  [id number]   cc:   Lajuana Matte, MD

## 2010-07-25 NOTE — Op Note (Signed)
NAME:  [name] [name], [name] [name]                   ACCOUNT NO.:  [id number]   MEDICAL RECORD NO.:  [id number]          PATIENT TYPE:  AMB   LOCATION:  SDS                          FACILITY:  [facility]   PHYSICIAN:  Salvatore Decent. Cornelius Moras, M.D. DATE OF BIRTH:  June 02, 1955   DATE OF PROCEDURE:  09/09/2007  DATE OF DISCHARGE:                               OPERATIVE REPORT   PREOPERATIVE DIAGNOSES:  Lung cancer with malposition of Port-A-Cath.   POSTOPERATIVE DIAGNOSES:  Lung cancer with malposition of Port-A-Cath.   PROCEDURE:  Port-A-Cath replacement, the right internal jugular vein.   SURGEON:  Salvatore Decent. Cornelius Moras, MD   ANESTHESIA:  1% lidocaine local with intravenous sedation.   BRIEF CLINICAL NOTE:  The patient is a 55 year old male who recently was  diagnosed with advanced stage lung cancer.  The patient previously  underwent Port-A-Cath placement via right subclavian vein approach.  Although, the catheter was initially in appropriate position, the  patient returned to the Oncology Clinic and was found to have migration  of the catheter backwards up the internal jugular vein on routine chest  x-ray.  The patient is now brought to the operating room for Port-A-Cath  replacement.  The indications, risks, potential benefits have been  discussed.  The patient understands and accepts all potential associated  risks and desires to proceed as described.   OPERATIVE NOTE IN DETAIL:  The patient was brought to the operating room  on the above-mentioned date and placed in the supine position on the  operating table.  Intravenous sedation is administered by the anesthesia  team under the care and direction of Dr. Kipp Brood.  Intravenous  antibiotics were administered.  The patient's anterior chest and neck  were prepared and draped in sterile manner.  The skin and subcutaneous  tissues overlying the previous Port-A-Cath incision was anesthetized  with 1% lidocaine solution.  In addition, a subcutaneous tunnel  was  anesthetized from this location up to the right lower neck.  The  previous incision from Port-A-Cath placement was reopened and the  existing Port-A-Cath was removed.  The pocket was irrigated with saline  solution.  The right internal jugular vein was cannulated using a  Seldinger technique.  Appropriate position of the guidewire was verified  using fluoroscopy.  A new power port 9.6-French subcutaneous port was  placed in the subcutaneous pocket and the catheter tunneled through the  subcutaneous tissues from the pocket to a small stab incision made at  the entry site into the internal jugular vein.  The dilator and  intravascular sheath was then passed over the guidewire into the  internal jugular vein.  The catheter was trimmed to an appropriate  length and subsequently passed through the introducing sheath after the  guidewire and dilator are removed.  The final position of the catheter  was verified using fluoroscopy.  The wound was again irrigated with  saline solution and the port was secured into the subcutaneous pocket  with Prolene sutures.  Incision was closed using interrupted 3-0 Vicryl  sutures to reapproximate the dermis.  The skin incision was  closed with a subcuticular skin closure and a  single stitch was placed in the stab incision in the right neck.  Dermabond glue was applied to the incisions.  The patient tolerated the  procedure well and was transported to the postanesthesia care unit in  stable condition.  There are no intraoperative complications.      Salvatore Decent. Cornelius Moras, M.D.  Electronically Signed     CHO/MEDQ  D:  09/09/2007  T:  09/09/2007  Job:  [id number]

## 2010-07-25 NOTE — Op Note (Signed)
NAME:  [name] [name], [name] [name]                   ACCOUNT NO.:  [id number]   MEDICAL RECORD NO.:  [id number]          PATIENT TYPE:  AMB   LOCATION:  SDS                          FACILITY:  [facility]   PHYSICIAN:  Salvatore Decent. Cornelius Moras, M.D. DATE OF BIRTH:  1955-07-08   DATE OF PROCEDURE:  08/19/2007  DATE OF DISCHARGE:  08/19/2007                               OPERATIVE REPORT   PREOPERATIVE DIAGNOSIS:  Right lung mass with left supraclavicular mass  and left axillary mass.   POSTOPERATIVE DIAGNOSIS:  Metastatic carcinoma.   PROCEDURE:  Left supraclavicular lymph node biopsy.   SURGEON:  Salvatore Decent. Cornelius Moras, MD   ANESTHESIA:  General.   BRIEF CLINICAL NOTE:  The patient is a 55 year old male with a  longstanding history of tobacco abuse who presents with a swollen mass  in the left supraclavicular fossa.  CT scan of the neck and chest  demonstrated right lung mass with mediastinal and hilar lymphadenopathy,  left supraclavicular mass, and left axillary mass.  The patient was  brought to the operating room for open biopsy for definitive diagnosis.   OPERATIVE PROCEDURE IN DETAIL:  The patient was brought to the operating  room on the above-mentioned date and placed in the supine position on  the operating table.  Intravenous antibiotics were administered.  General endotracheal anesthesia was induced uneventfully under the  direction of Dr. Adonis Huguenin.  The patient is positioned with a roll  behind his shoulders.  The neck was gently extended and turned towards  the right in the reversed Trendelenburg position.  The patient's left  neck and chest were prepared and draped in sterile manner.  A small  transverse incision was made in the left supraclavicular fossa  immediately anterior to the clavicular head of the sternocleidomastoid  muscle 2 fingerbreadths above the clavicle.  One can palpate a firm mass  immediately beneath the incision.  The incision was completed through  the platysma muscle with  electrocautery.  The clavicular head of the  sternocleidomastoid muscle was retracted laterally.  The omohyoid muscle  was retracted superiorly.  Cervical fascia was incised.  One can palpate  a firm mass beneath this.  This was teased away from associated  structures.  Intervening feeding lymphatics were divided with Hemoclips.  The enlarged firm lymph node mass was subsequently removed and sent to  pathology.  The wound was irrigated with saline solution, inspected for  meticulous hemostasis, and subsequently closed using interrupted 3-0  Vicryl sutures to reapproximate the platysma muscle.  The skin was  closed with a subcuticular skin closure.  Dermabond was applied over the  incision.  The patient tolerated the procedure well and was extubated in  the operating room uneventfully.  Preliminary frozen  section pathology results revealed metastatic carcinoma, likely  adenocarcinoma.  The patient was transported to postanesthesia care unit  stable condition.  There were no intraoperative complications.  Estimated blood loss was trivial.      Salvatore Decent. Cornelius Moras, M.D.  Electronically Signed     CHO/MEDQ  D:  08/19/2007  T:  08/20/2007  Job:  [id number]   cc:   Lajuana Matte, MD

## 2010-08-02 ENCOUNTER — Other Ambulatory Visit (HOSPITAL_COMMUNITY): Payer: Medicare Other

## 2010-08-11 ENCOUNTER — Other Ambulatory Visit: Payer: Self-pay | Admitting: Internal Medicine

## 2010-08-11 DIAGNOSIS — C3490 Malignant neoplasm of unspecified part of unspecified bronchus or lung: Secondary | ICD-10-CM

## 2010-08-18 ENCOUNTER — Other Ambulatory Visit: Payer: Self-pay | Admitting: Internal Medicine

## 2010-08-18 ENCOUNTER — Encounter (HOSPITAL_BASED_OUTPATIENT_CLINIC_OR_DEPARTMENT_OTHER): Payer: Medicare Other | Admitting: Internal Medicine

## 2010-08-18 ENCOUNTER — Ambulatory Visit (HOSPITAL_COMMUNITY)
Admission: RE | Admit: 2010-08-18 | Discharge: 2010-08-18 | Disposition: A | Discharge status: Home / Self Care | Payer: Medicare Other | Source: Ambulatory Visit | Attending: Internal Medicine | Admitting: Internal Medicine

## 2010-08-18 DIAGNOSIS — C3430 Malignant neoplasm of lower lobe, unspecified bronchus or lung: Secondary | ICD-10-CM

## 2010-08-18 DIAGNOSIS — J90 Pleural effusion, not elsewhere classified: Secondary | ICD-10-CM | POA: Insufficient documentation

## 2010-08-18 DIAGNOSIS — C3490 Malignant neoplasm of unspecified part of unspecified bronchus or lung: Secondary | ICD-10-CM

## 2010-08-18 DIAGNOSIS — N289 Disorder of kidney and ureter, unspecified: Secondary | ICD-10-CM

## 2010-08-18 DIAGNOSIS — J984 Other disorders of lung: Secondary | ICD-10-CM | POA: Insufficient documentation

## 2010-08-18 DIAGNOSIS — I700 Atherosclerosis of aorta: Secondary | ICD-10-CM | POA: Insufficient documentation

## 2010-08-18 DIAGNOSIS — J320 Chronic maxillary sinusitis: Secondary | ICD-10-CM | POA: Insufficient documentation

## 2010-08-18 LAB — COMPREHENSIVE METABOLIC PANEL
ALT: <8 U/L (ref 0–53)
BUN: 19 mg/dL (ref 6–23)
CO2: 26 mEq/L (ref 19–32)
Calcium: 9 mg/dL (ref 8.4–10.5)
Creatinine, Ser: 1.91 mg/dL — ABNORMAL HIGH (ref 0.50–1.35)
Glucose, Bld: 115 mg/dL — ABNORMAL HIGH (ref 70–99)
Total Bilirubin: 0.2 mg/dL — ABNORMAL LOW (ref 0.3–1.2)

## 2010-08-18 LAB — CBC WITH DIFFERENTIAL/PLATELET
BASO%: 0.4 % (ref 0.0–2.0)
Basophils Absolute: 0 10*3/uL (ref 0.0–0.1)
HCT: 31.9 % — ABNORMAL LOW (ref 38.4–49.9)
HGB: 10.6 g/dL — ABNORMAL LOW (ref 13.0–17.1)
LYMPH%: 23.7 % (ref 14.0–49.0)
MCHC: 33.3 g/dL (ref 32.0–36.0)
MONO#: 0.5 10*3/uL (ref 0.1–0.9)
NEUT%: 65 % (ref 39.0–75.0)
Platelets: 351 10*3/uL (ref 140–400)
WBC: 7.8 10*3/uL (ref 4.0–10.3)

## 2010-08-18 LAB — POCT I-STAT, CHEM 8
BUN: 20 mg/dL (ref 6–23)
Chloride: 105 mEq/L (ref 96–112)
Creatinine, Ser: 2 mg/dL — ABNORMAL HIGH (ref 0.4–1.5)
Potassium: 3.9 mEq/L (ref 3.5–5.1)
Sodium: 140 mEq/L (ref 135–145)

## 2010-08-22 ENCOUNTER — Other Ambulatory Visit: Payer: Self-pay | Admitting: Internal Medicine

## 2010-08-22 ENCOUNTER — Encounter (HOSPITAL_BASED_OUTPATIENT_CLINIC_OR_DEPARTMENT_OTHER): Payer: Medicare Other | Admitting: Internal Medicine

## 2010-08-22 DIAGNOSIS — C3490 Malignant neoplasm of unspecified part of unspecified bronchus or lung: Secondary | ICD-10-CM

## 2010-08-22 DIAGNOSIS — C778 Secondary and unspecified malignant neoplasm of lymph nodes of multiple regions: Secondary | ICD-10-CM

## 2010-08-22 DIAGNOSIS — C3430 Malignant neoplasm of lower lobe, unspecified bronchus or lung: Secondary | ICD-10-CM

## 2010-11-17 ENCOUNTER — Other Ambulatory Visit (HOSPITAL_COMMUNITY): Payer: Medicare Other

## 2010-11-28 ENCOUNTER — Other Ambulatory Visit (HOSPITAL_COMMUNITY): Payer: Medicare Other

## 2010-12-07 LAB — COMPREHENSIVE METABOLIC PANEL
Alkaline Phosphatase: 67
Alkaline Phosphatase: 60
Alkaline Phosphatase: 63
BUN: 10
BUN: 11
BUN: 12
CO2: 25
CO2: 26
Chloride: 102
GFR calc non Af Amer: >60
GFR calc non Af Amer: >60
Glucose, Bld: 105 — ABNORMAL HIGH
Glucose, Bld: 105 — ABNORMAL HIGH
Glucose, Bld: 115 — ABNORMAL HIGH
Potassium: 3.5
Potassium: 3.5
Potassium: 3.6
Total Bilirubin: 0.4
Total Bilirubin: 0.6
Total Bilirubin: 1.3 — ABNORMAL HIGH
Total Protein: 5.9 — ABNORMAL LOW
Total Protein: 5.9 — ABNORMAL LOW

## 2010-12-07 LAB — CBC
HCT: 43.3
Hemoglobin: 14.7
MCHC: 34.1
MCHC: 34.2
MCV: 83.9
Platelets: 383
RBC: 5.25
RBC: 5.41
RDW: 12.7
RDW: 12.1
RDW: 12.9

## 2010-12-07 LAB — PROTIME-INR
INR: 0.9
INR: 0.9
INR: 1
Prothrombin Time: 12.3
Prothrombin Time: 12.8
Prothrombin Time: 13

## 2010-12-21 ENCOUNTER — Other Ambulatory Visit: Payer: Self-pay | Admitting: Internal Medicine

## 2010-12-21 DIAGNOSIS — C3490 Malignant neoplasm of unspecified part of unspecified bronchus or lung: Secondary | ICD-10-CM

## 2011-01-01 ENCOUNTER — Other Ambulatory Visit (HOSPITAL_COMMUNITY): Payer: Medicare Other

## 2011-01-02 ENCOUNTER — Other Ambulatory Visit: Payer: Self-pay | Admitting: Internal Medicine

## 2011-01-02 ENCOUNTER — Ambulatory Visit (HOSPITAL_COMMUNITY)
Admission: RE | Admit: 2011-01-02 | Discharge: 2011-01-02 | Disposition: A | Discharge status: Home / Self Care | Payer: Medicare Other | Source: Ambulatory Visit | Attending: Internal Medicine | Admitting: Internal Medicine

## 2011-01-02 ENCOUNTER — Encounter (HOSPITAL_BASED_OUTPATIENT_CLINIC_OR_DEPARTMENT_OTHER): Payer: Medicare Other | Admitting: Internal Medicine

## 2011-01-02 ENCOUNTER — Encounter (HOSPITAL_COMMUNITY): Payer: Self-pay

## 2011-01-02 DIAGNOSIS — R911 Solitary pulmonary nodule: Secondary | ICD-10-CM | POA: Insufficient documentation

## 2011-01-02 DIAGNOSIS — N289 Disorder of kidney and ureter, unspecified: Secondary | ICD-10-CM

## 2011-01-02 DIAGNOSIS — C3430 Malignant neoplasm of lower lobe, unspecified bronchus or lung: Secondary | ICD-10-CM

## 2011-01-02 DIAGNOSIS — K5730 Diverticulosis of large intestine without perforation or abscess without bleeding: Secondary | ICD-10-CM | POA: Insufficient documentation

## 2011-01-02 DIAGNOSIS — C3490 Malignant neoplasm of unspecified part of unspecified bronchus or lung: Secondary | ICD-10-CM | POA: Insufficient documentation

## 2011-01-02 DIAGNOSIS — D649 Anemia, unspecified: Secondary | ICD-10-CM

## 2011-01-02 HISTORY — DX: Essential (primary) hypertension: I10

## 2011-01-02 HISTORY — DX: Malignant (primary) neoplasm, unspecified: C80.1

## 2011-01-02 LAB — CBC WITH DIFFERENTIAL/PLATELET
Basophils Absolute: 0 10*3/uL (ref 0.0–0.1)
EOS%: 5.5 % (ref 0.0–7.0)
HGB: 12 g/dL — ABNORMAL LOW (ref 13.0–17.1)
MCH: 28.4 pg (ref 27.2–33.4)
MCV: 85.4 fL (ref 79.3–98.0)
MONO%: 10.3 % (ref 0.0–14.0)
RDW: 16.7 % — ABNORMAL HIGH (ref 11.0–14.6)

## 2011-01-02 LAB — CMP (CANCER CENTER ONLY)
AST: 18 U/L (ref 11–38)
Albumin: 3.7 g/dL (ref 3.3–5.5)
Alkaline Phosphatase: 81 U/L (ref 26–84)
BUN, Bld: 34 mg/dL — ABNORMAL HIGH (ref 7–22)
Creat: 2.4 mg/dl — ABNORMAL HIGH (ref 0.6–1.2)
Potassium: 4.2 mEq/L (ref 3.3–4.7)

## 2011-01-09 ENCOUNTER — Encounter (HOSPITAL_BASED_OUTPATIENT_CLINIC_OR_DEPARTMENT_OTHER): Payer: Medicare Other | Admitting: Internal Medicine

## 2011-01-09 DIAGNOSIS — R918 Other nonspecific abnormal finding of lung field: Secondary | ICD-10-CM

## 2011-01-09 DIAGNOSIS — Z85118 Personal history of other malignant neoplasm of bronchus and lung: Secondary | ICD-10-CM

## 2011-01-12 ENCOUNTER — Other Ambulatory Visit: Payer: Self-pay | Admitting: Internal Medicine

## 2011-01-12 DIAGNOSIS — C3490 Malignant neoplasm of unspecified part of unspecified bronchus or lung: Secondary | ICD-10-CM

## 2011-01-13 ENCOUNTER — Telehealth: Payer: Self-pay | Admitting: Internal Medicine

## 2011-01-13 NOTE — Telephone Encounter (Signed)
Mailed trhe pt his ct scan appt d/t along with the instructions

## 2011-03-29 ENCOUNTER — Other Ambulatory Visit (HOSPITAL_COMMUNITY): Payer: Medicare Other

## 2011-04-05 ENCOUNTER — Other Ambulatory Visit: Payer: Medicare Other | Admitting: Lab

## 2011-04-05 ENCOUNTER — Ambulatory Visit (HOSPITAL_COMMUNITY): Payer: Medicare Other

## 2011-04-11 ENCOUNTER — Ambulatory Visit: Payer: Medicare Other | Admitting: Internal Medicine

## 2011-04-11 ENCOUNTER — Other Ambulatory Visit: Payer: Self-pay | Admitting: Internal Medicine

## 2011-04-11 DIAGNOSIS — C3490 Malignant neoplasm of unspecified part of unspecified bronchus or lung: Secondary | ICD-10-CM

## 2011-04-16 ENCOUNTER — Other Ambulatory Visit (HOSPITAL_BASED_OUTPATIENT_CLINIC_OR_DEPARTMENT_OTHER): Payer: Medicare Other | Admitting: Lab

## 2011-04-16 ENCOUNTER — Ambulatory Visit (HOSPITAL_COMMUNITY)
Admission: RE | Admit: 2011-04-16 | Discharge: 2011-04-16 | Disposition: A | Discharge status: Home / Self Care | Payer: Medicare Other | Source: Ambulatory Visit | Attending: Internal Medicine | Admitting: Internal Medicine

## 2011-04-16 DIAGNOSIS — J90 Pleural effusion, not elsewhere classified: Secondary | ICD-10-CM | POA: Insufficient documentation

## 2011-04-16 DIAGNOSIS — I359 Nonrheumatic aortic valve disorder, unspecified: Secondary | ICD-10-CM | POA: Insufficient documentation

## 2011-04-16 DIAGNOSIS — I2510 Atherosclerotic heart disease of native coronary artery without angina pectoris: Secondary | ICD-10-CM | POA: Insufficient documentation

## 2011-04-16 DIAGNOSIS — C3430 Malignant neoplasm of lower lobe, unspecified bronchus or lung: Secondary | ICD-10-CM

## 2011-04-16 DIAGNOSIS — J984 Other disorders of lung: Secondary | ICD-10-CM | POA: Insufficient documentation

## 2011-04-16 DIAGNOSIS — I700 Atherosclerosis of aorta: Secondary | ICD-10-CM | POA: Insufficient documentation

## 2011-04-16 DIAGNOSIS — C3490 Malignant neoplasm of unspecified part of unspecified bronchus or lung: Secondary | ICD-10-CM

## 2011-04-16 LAB — CMP (CANCER CENTER ONLY)
AST: 17 U/L (ref 11–38)
Alkaline Phosphatase: 63 U/L (ref 26–84)
BUN, Bld: 19 mg/dL (ref 7–22)
Creat: 1.9 mg/dl — ABNORMAL HIGH (ref 0.6–1.2)

## 2011-04-16 LAB — CBC WITH DIFFERENTIAL/PLATELET
Basophils Absolute: 0 10*3/uL (ref 0.0–0.1)
EOS%: 3.7 % (ref 0.0–7.0)
HCT: 40.8 % (ref 38.4–49.9)
HGB: 13.4 g/dL (ref 13.0–17.1)
LYMPH%: 25.3 % (ref 14.0–49.0)
MCH: 28.3 pg (ref 27.2–33.4)
MCV: 86.1 fL (ref 79.3–98.0)
MONO%: 6.9 % (ref 0.0–14.0)
NEUT%: 63.6 % (ref 39.0–75.0)
Platelets: 251 10*3/uL (ref 140–400)

## 2011-04-18 ENCOUNTER — Ambulatory Visit (HOSPITAL_BASED_OUTPATIENT_CLINIC_OR_DEPARTMENT_OTHER): Payer: Medicare Other | Admitting: Internal Medicine

## 2011-04-18 VITALS — BP 148/83 | HR 85 | Temp 98.0°F | Wt 153.5 lb

## 2011-04-18 DIAGNOSIS — C3490 Malignant neoplasm of unspecified part of unspecified bronchus or lung: Secondary | ICD-10-CM

## 2011-04-18 NOTE — Progress Notes (Signed)
[facility] OFFICE PROGRESS NOTE  PRINCIPAL DIAGNOSIS:   1. Metastatic non-small cell lung cancer, adenocarcinoma diagnosed in June of 2009. 2. Renal insufficiency followed by Dr. Allena Katz. 3. History of seizure followed by Dr. Thad Ranger.  PRIOR THERAPY:   1. Status post 6 cycles of systemic chemotherapy with carboplatin, Alimta, and Avastin.  Last dose was given December 25, 2007, and the patient had partial response to this treatment.   2. Status post maintenance chemotherapy with Alimta 500 mg per meter squared and Avastin 15 mg/kg given every 3 weeks, status post 23 cycles.  Last dose was given May 23, 2009.  The patient had stable disease but the treatment was discontinued secondary to toxicity and renal insufficiency.  CURRENT THERAPY:  Observation.  INTERVAL HISTORY: [name] [name] 56 y.o. male returns to the clinic today for three-month followup visit accompanied his girlfriend. He is feeling fine and he denied having any specific complaints today. 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 He denied having any significant chest pain or shortness of breath, no cough or hemoptysis. He has no weight loss or night sweats. He has repeat CT scan of the chest, abdomen and pelvis performed recently and he is here today for evaluation and discussion of his scan results.  MEDICAL HISTORY: Past Medical History  Diagnosis Date  . Hypertension   . lung ca dx'd 05/2007    chemo comp 12/2009    ALLERGIES:   has no known allergies.  MEDICATIONS:  Current Outpatient Prescriptions  Medication Sig Dispense Refill  . ALPRAZolam (XANAX) 1 MG tablet Take 1 mg by mouth at bedtime as needed.      Marland Kitchen amLODipine (NORVASC) 5 MG tablet Take 5 mg by mouth daily.      Marland Kitchen co-enzyme Q-10 50 MG capsule Take 100 mg by mouth daily.      . folic acid (FOLVITE) 1 MG tablet Take 1 mg by mouth daily.      . metoprolol (LOPRESSOR) 100 MG tablet Take 100 mg by mouth daily.      Marland Kitchen  oxycodone (OXYCONTIN) 30 MG TB12 Take 30 mg by mouth every 8 (eight) hours.      Marland Kitchen oxyCODONE (ROXICODONE) 15 MG immediate release tablet Take 15 mg by mouth every 6 (six) hours as needed.      . vitamin B-12 (CYANOCOBALAMIN) 1000 MCG tablet Take 1,000 mcg by mouth daily.      . vitamin C (ASCORBIC ACID) 500 MG tablet Take 500 mg by mouth daily.      . vitamin E 100 UNIT capsule Take 100 Units by mouth daily.      Marland Kitchen zolpidem (AMBIEN) 10 MG tablet Take 10 mg by mouth at bedtime as needed.       REVIEW OF SYSTEMS:  A comprehensive review of systems was negative.   PHYSICAL EXAMINATION: General appearance: alert, cooperative and no distress Lymph nodes: Cervical, supraclavicular, and axillary nodes normal. Resp: clear to auscultation bilaterally Cardio: regular rate and rhythm, S1, S2 normal, no murmur, click, rub or gallop GI: soft, non-tender; bowel sounds normal; no masses,  no organomegaly Extremities: extremities normal, atraumatic, no cyanosis or edema Neurologic: Alert and oriented X 3, normal strength and tone. Normal symmetric reflexes. Normal coordination and gait  ECOG PERFORMANCE STATUS: 0 - Asymptomatic  Blood pressure 148/83, pulse 85, temperature 98 F (36.7 C), temperature source Oral, weight 153 lb 8 oz (69.627 kg).  LABORATORY DATA: Lab Results  Component Value Date   WBC 7.8 04/16/2011   HGB 13.4 04/16/2011   HCT 40.8 04/16/2011   MCV 86.1 04/16/2011   PLT 251 04/16/2011      Chemistry      Component Value Date/Time   NA 144 04/16/2011 1548   NA 140 08/18/2010 1621   K 4.2 04/16/2011 1548   K 3.9 08/18/2010 1621   CL 104 04/16/2011 1548   CL 105 08/18/2010 1621   CO2 28 04/16/2011 1548   CO2 26 08/18/2010 1352   BUN 19 04/16/2011 1548   BUN 20 08/18/2010 1621   CREATININE 1.9* 04/16/2011 1548   CREATININE 2.00* 08/18/2010 1621      Component Value Date/Time   CALCIUM 9.1 04/16/2011 1548   CALCIUM 9.0 08/18/2010 1352   ALKPHOS 63 04/16/2011 1548   ALKPHOS 74 08/18/2010 1352   AST 17 04/16/2011  1548   AST 12 08/18/2010 1352   ALT <8 08/18/2010 1352   BILITOT 0.50 04/16/2011 1548   BILITOT 0.2* 08/18/2010 1352       RADIOGRAPHIC STUDIES: Ct Chest Wo Contrast  04/16/2011  *RADIOLOGY REPORT*  Clinical Data:  History of lung cancer.  CT CHEST, ABDOMEN AND PELVIS WITHOUT CONTRAST  Technique:  Multidetector CT imaging of the chest, abdomen and pelvis was performed following the standard protocol without IV contrast.  Comparison:  Multiple priors, most recently 01/02/2011.   CT CHEST  Findings:  Mediastinum: Heart size is normal. There is no significant pericardial fluid, thickening or pericardial calcification. There is atherosclerosis of the thoracic aorta, the great vessels of the mediastinum and the coronary arteries, including calcified atherosclerotic plaque in the left main, left anterior descending and right coronary arteries. Calcifications of the aortic valve. No pathologically enlarged mediastinal or hilar lymph nodes (please note that sensitivity for detection of subtle hilar adenopathy is limited on noncontrast CT examination).  Esophagus is unremarkable in appearance.  Right internal jugular single lumen Port-A-Cath with tip terminating at the superior cavoatrial junction.  Lungs/Pleura: Previously noted 6 mm nodule in the periphery of the right lower lobe has increased in size, currently measuring 8 mm (image 39 of series 4).  Previously noted pleural-based area of nodular architectural distortion in the lateral aspect of the right lower lobe (image 48 of series 4 measures 12 x 13 mm, and image 47 of series 4 measures 8 x 15 mm) is completely unchanged compared to the prior examination 01/02/2011.  No other definite new or enlarging suspicious appearing pulmonary nodules or masses are otherwise identified.  There continues to be a chronic right-sided pleural effusion which is small, but demonstrates some associated pleural thickening, with a small anterior loculated component adjacent to the  right middle lobe (peripheral architectural distortion in the right middle lobe is similar to the prior examination, and may herald a region of developing rounded atelectasis).  Musculoskeletal: There are no aggressive appearing lytic or blastic lesions noted in the visualized portions of the skeleton.  IMPRESSION: 1.  Interval enlargement of what is now an 8 mm nodule in the right lower lobe (previously 6 mm), concerning for new metastatic disease.  This lesion likely remains below the discriminate toward a size of PET.  Consideration for CT guided percutaneous needle biopsy may be warranted if clinically indicated. 2.  Chronic small left-sided pleural effusion (partially loculated) and chronic pleural thickening unchanged.  Additionally, pleural based nodular area of architectural distortion in the base of the right lower lobe is  completely unchanged compared to the prior examination, as detailed above. 3. Atherosclerosis, including left main and three-vessel coronary artery disease. Please note that although the presence of coronary artery calcium documents the presence of coronary artery disease, the severity of this disease and any potential stenosis cannot be assessed on this non-gated CT examination.  Assessment for potential risk factor modification, dietary therapy or pharmacologic therapy may be warranted, if clinically indicated. 4. There are calcifications of the the aortic valve. Echocardiographic correlation for evaluation of potential valvular dysfunction may be warranted if clinically indicated.   CT ABDOMEN AND PELVIS  Findings:  Abdomen/Pelvis: The unfused appearance of the liver, gallbladder, pancreas, spleen, bilateral adrenal glands and bilateral kidneys is unremarkable.  No abnormal urinary tract calcifications.  No hydroureteronephrosis.  Extensive atherosclerosis throughout the abdominal and pelvic vasculature.  Urinary bladder is unremarkable.  Multiple coarse calcifications are noted  within the prostate gland. There is diverticulosis of the sigmoid colon, without surrounding inflammatory changes at this time to suggest acute diverticulitis.  Musculoskeletal: There are no aggressive appearing lytic or blastic lesions noted in the visualized portions of the skeleton.  IMPRESSION: 1.  No findings to suggest metastatic disease to the abdomen or pelvis. 2.  Mild diverticulosis of the sigmoid colon, without surrounding inflammatory changes to suggest acute diverticulitis at this time.  Original Report Authenticated By: Florencia Reasons, M.D.    ASSESSMENT: This is a very pleasant 56 years old white male with history of metastatic non-small cell lung cancer status post systemic chemotherapy was carboplatin, Alimta and Avastin followed by maintenance treatment with Alimta and Avastin and the patient has been on observation since March of 2011. I discussed the scan results with the patient and his girlfriend. His scan is stable in general except for the right lower lobe which increased by 2 mm.   PLAN: I will continue the patient on observation for now with repeat CT scan of the chest, abdomen and pelvis in 3 months for reevaluation. If the right lower lobe nodule continues to increase in size in the absence of other metastatic disease, I may consider the patient for stereotactic radiotherapy to this lesion. The patient was advised to call me immediately if he has any concerning symptoms in the interval. He would continue his routine followup visit with his primary care physician regarding the questionable atherosclerosis of the coronary arteries.   All questions were answered. The patient knows to call the clinic with any problems, questions or concerns. We can certainly see the patient much sooner if necessary.

## 2011-05-04 IMAGING — CT CT CHEST W/ CM
1 of 3 series · 15 of 29 positions shown, 19 images · IV contrast (agent unspecified)
Comparison: 09/28/2008

CT CHEST

CLINICAL DATA: lung carcinoma

CT CHEST, ABDOMEN AND PELVIS WITH CONTRAST
TECHNIQUE: Multidetector CT imaging of the chest, abdomen and
pelvis was performed following the standard protocol during bolus
administration of intravenous contrast.
Contrast: 100 ml Nmnipaque-AFF IV

[Series 2: cap with st · axial · 0.73mm/px · z∈[-600,-55]mm · 15 of 125 slices shown, 19 images]
[im 8/125  mediastinal]
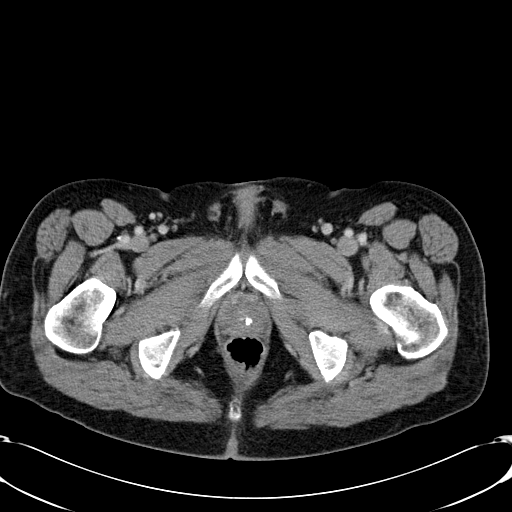
[im 8/125  lung]
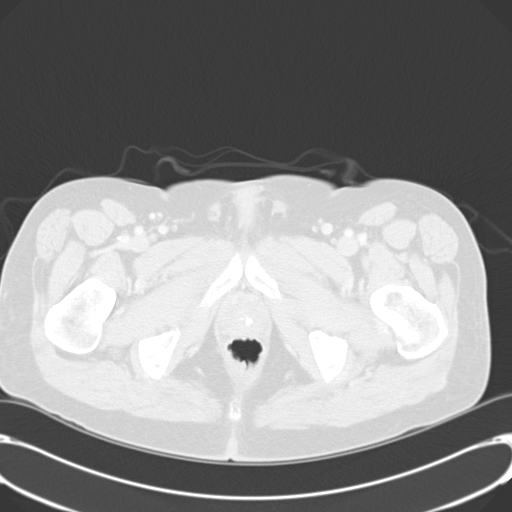
[im 16/125  lung]
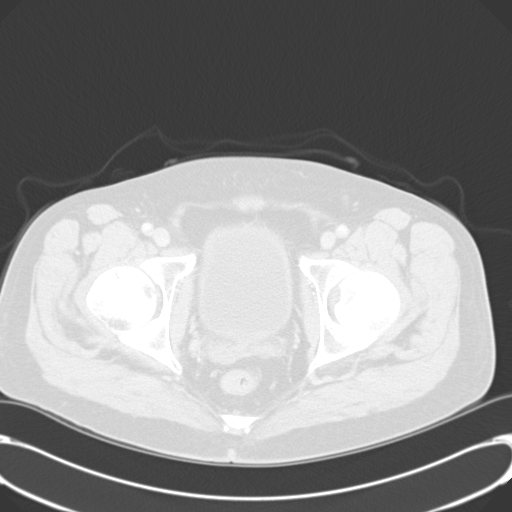
[im 24/125  lung]
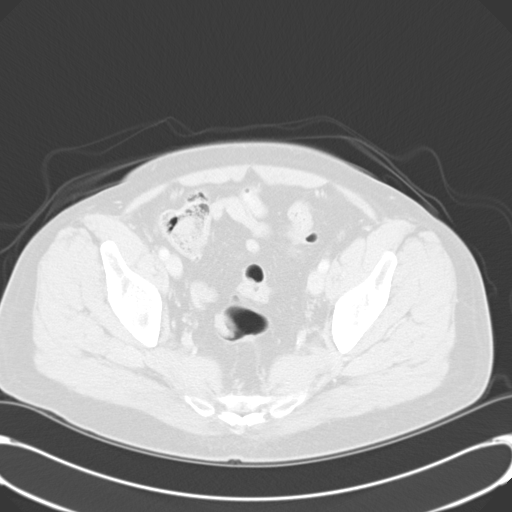
[im 32/125  lung]
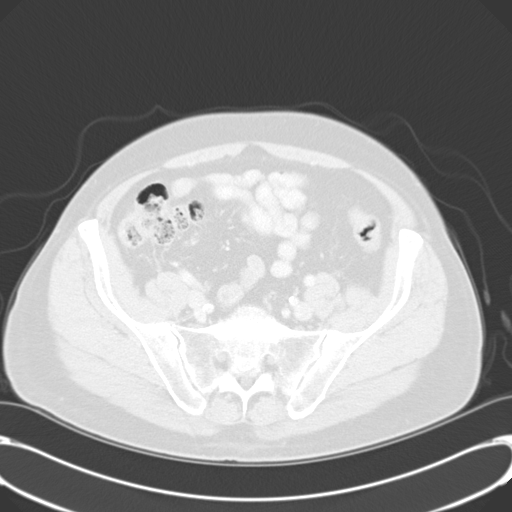
[im 39/125  mediastinal]
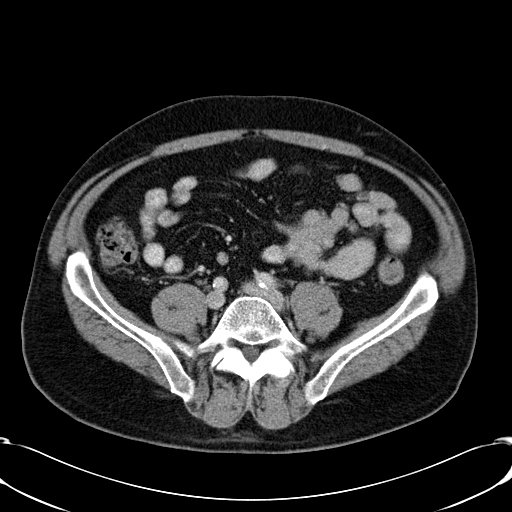
[im 39/125  lung]
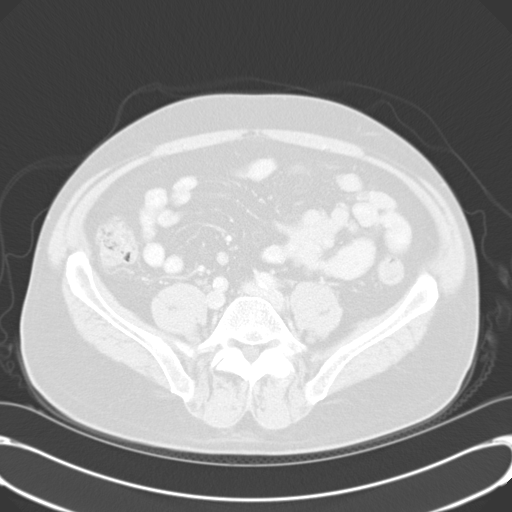
[im 47/125  lung]
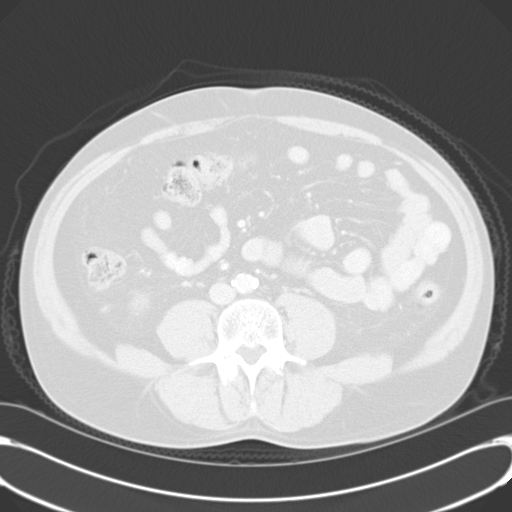
[im 55/125  lung]
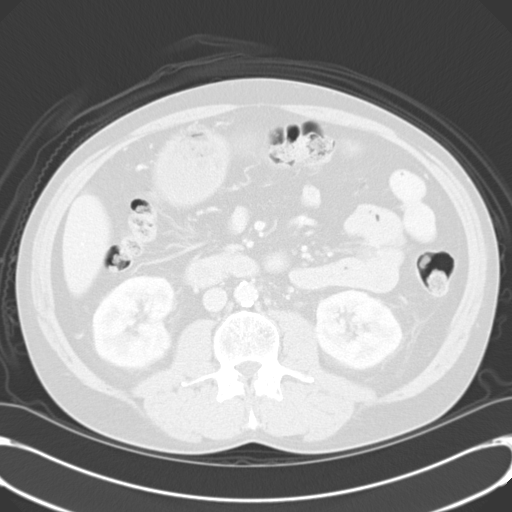
[im 63/125  lung]
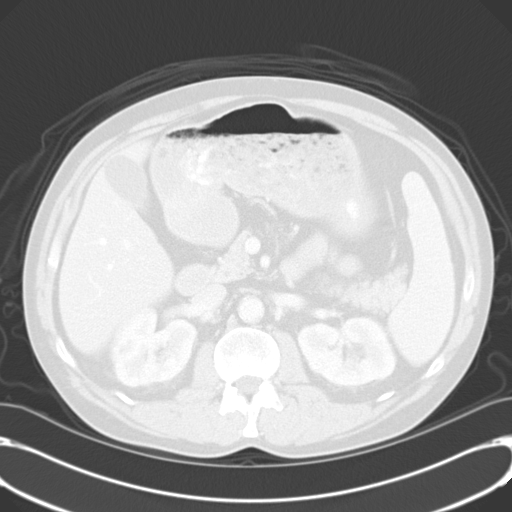
[im 70/125  mediastinal]
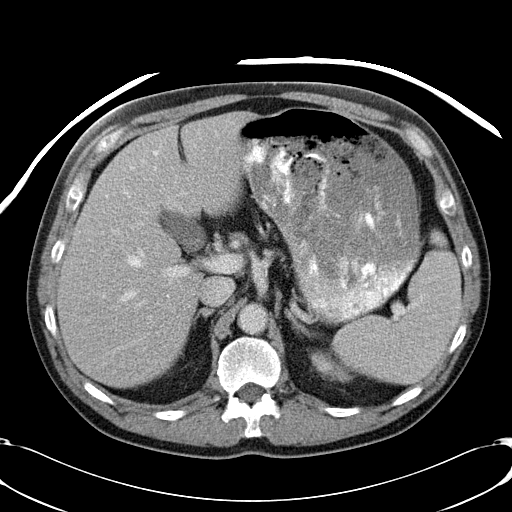
[im 70/125  lung]
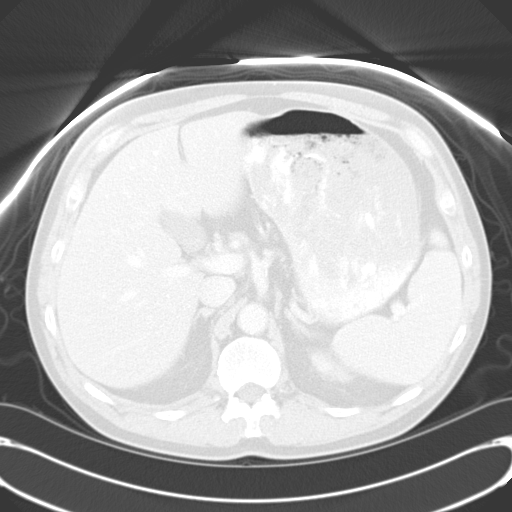
[im 78/125  lung]
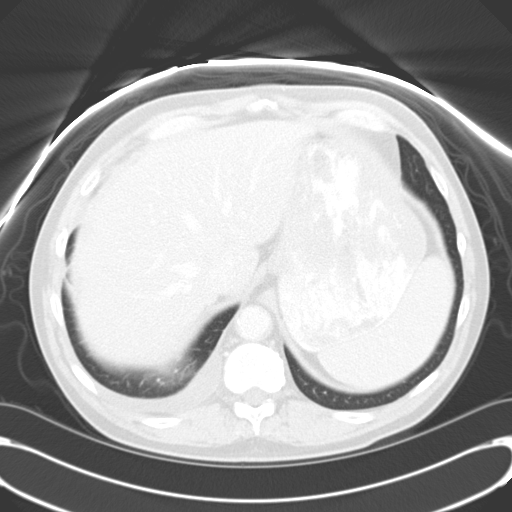
[im 86/125  lung]
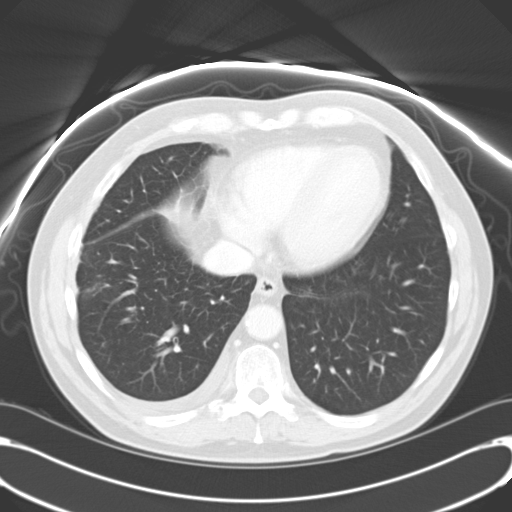
[im 94/125  lung]
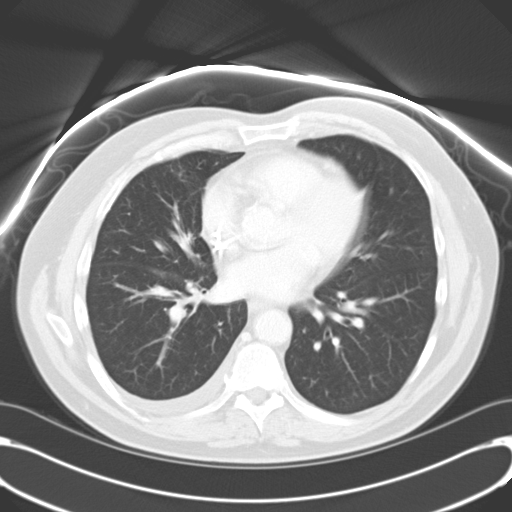
[im 101/125  mediastinal]
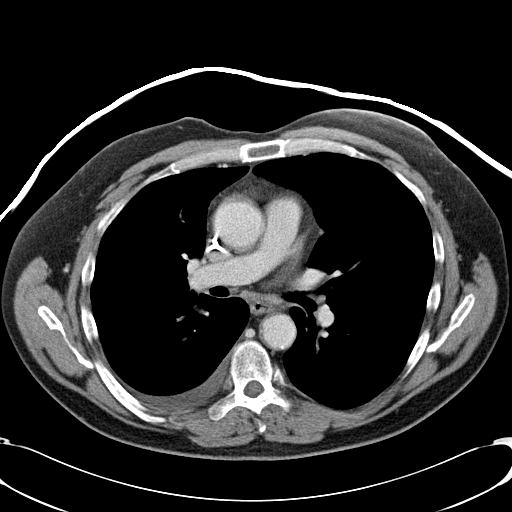
[im 101/125  lung]
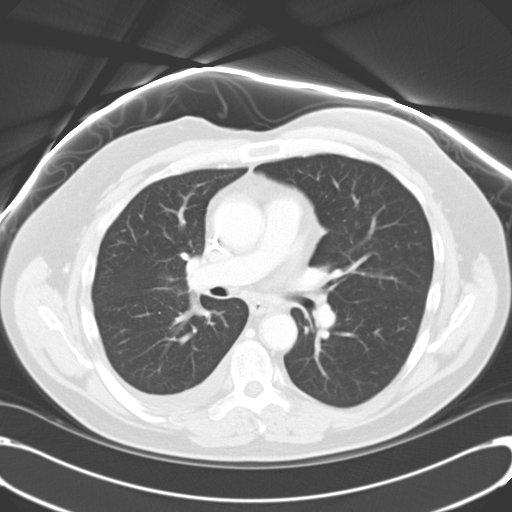
[im 109/125  lung]
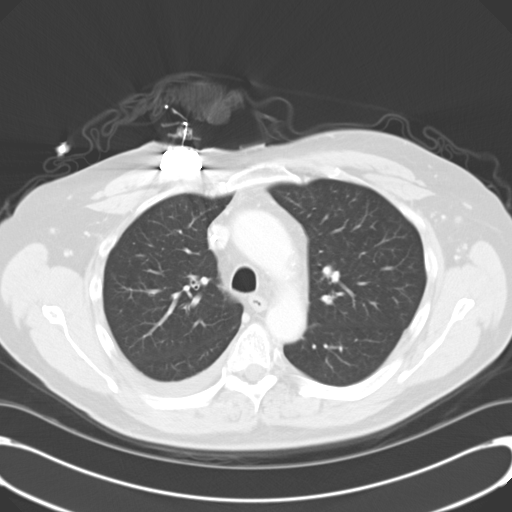
[im 117/125  lung]
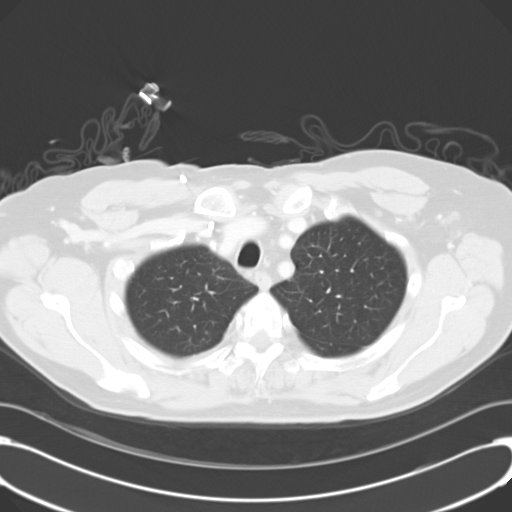

[15 of 29 positions shown; findings below may reference images not displayed]

FINDINGS: Right IJ port catheter stable position.  Stable small
right pleural effusion.  Patchy aortic calcifications.  No hilar or
mediastinal adenopathy.  8 x 15 mm nodule in the lateral basal
segment right lower lobe is stable compared to prior study.  No new
lung nodule is identified.
IMPRESSION: 1.  Stable right lower lobe lesion.
2.  Stable small right pleural effusion.
3.  No new nodule, mass, or adenopathy.

CT ABDOMEN
FINDINGS: Stable sub centimeter low attenuation focus in the
posterior right hepatic segment image 55 of sequence 2.  No new
liver lesions.  Unremarkable nondilated gallbladder, spleen,
adrenal glands, kidneys, pancreas, portal vein, small bowel.  No
free air.  No ascites.  Atheromatous nondilated abdominal aorta.
Stable sub centimeter left para-aortic and aortocaval lymph nodes.
No mesenteric adenopathy. Delayed scans show normal bilateral renal
excretion, no hydronephrosis.  The stomach is distended with
contrast and ingested material.
IMPRESSION: 1.  Negative for mass or adenopathy.

CT PELVIS
FINDINGS: Normal appendix.  The colon is nondilated, unremarkable.
Mild prostatic enlargement with central coarse calcifications.
Urinary bladder is incompletely distended and appears mildly thick-
walled.  There is no free fluid.  Patchy iliofemoral arterial
calcifications.  No adenopathy.
IMPRESSION: 1.  Negative for mass or adenopathy.

## 2011-07-16 ENCOUNTER — Ambulatory Visit: Payer: Medicare Other | Admitting: Internal Medicine

## 2011-07-18 ENCOUNTER — Ambulatory Visit (HOSPITAL_COMMUNITY)
Admission: RE | Admit: 2011-07-18 | Discharge: 2011-07-18 | Disposition: A | Discharge status: Home / Self Care | Payer: Medicare Other | Source: Ambulatory Visit | Attending: Internal Medicine | Admitting: Internal Medicine

## 2011-07-18 ENCOUNTER — Other Ambulatory Visit (HOSPITAL_BASED_OUTPATIENT_CLINIC_OR_DEPARTMENT_OTHER): Payer: Medicare Other | Admitting: Lab

## 2011-07-18 DIAGNOSIS — N289 Disorder of kidney and ureter, unspecified: Secondary | ICD-10-CM

## 2011-07-18 DIAGNOSIS — R911 Solitary pulmonary nodule: Secondary | ICD-10-CM | POA: Insufficient documentation

## 2011-07-18 DIAGNOSIS — C3490 Malignant neoplasm of unspecified part of unspecified bronchus or lung: Secondary | ICD-10-CM

## 2011-07-18 DIAGNOSIS — J984 Other disorders of lung: Secondary | ICD-10-CM | POA: Insufficient documentation

## 2011-07-18 LAB — CBC WITH DIFFERENTIAL/PLATELET
BASO%: 0.9 % (ref 0.0–2.0)
Basophils Absolute: 0.1 10*3/uL (ref 0.0–0.1)
EOS%: 4.5 % (ref 0.0–7.0)
HCT: 40.8 % (ref 38.4–49.9)
LYMPH%: 24.3 % (ref 14.0–49.0)
MCH: 29 pg (ref 27.2–33.4)
MCHC: 33.5 g/dL (ref 32.0–36.0)
MCV: 86.7 fL (ref 79.3–98.0)
MONO%: 8.2 % (ref 0.0–14.0)
NEUT%: 62.1 % (ref 39.0–75.0)
Platelets: 292 10*3/uL (ref 140–400)

## 2011-07-18 LAB — CMP (CANCER CENTER ONLY)
AST: 19 U/L (ref 11–38)
Alkaline Phosphatase: 83 U/L (ref 26–84)
BUN, Bld: 20 mg/dL (ref 7–22)
Calcium: 8.8 mg/dL (ref 8.0–10.3)
Creat: 1.8 mg/dl — ABNORMAL HIGH (ref 0.6–1.2)
Glucose, Bld: 109 mg/dL (ref 73–118)

## 2011-07-23 ENCOUNTER — Ambulatory Visit: Payer: Medicare Other | Admitting: Internal Medicine

## 2011-07-23 ENCOUNTER — Telehealth: Payer: Self-pay | Admitting: Internal Medicine

## 2011-07-23 NOTE — Telephone Encounter (Signed)
had called and l/m to cx todays appt,called back to r/s and they did not want to r/s at this time

## 2011-08-20 ENCOUNTER — Telehealth: Payer: Self-pay | Admitting: Internal Medicine

## 2011-08-20 NOTE — Telephone Encounter (Signed)
she called to r/s a lab appt but her mailbox is full    aom

## 2011-11-27 ENCOUNTER — Telehealth: Payer: Self-pay | Admitting: Medical Oncology

## 2011-11-27 ENCOUNTER — Telehealth: Payer: Self-pay | Admitting: Internal Medicine

## 2011-11-27 DIAGNOSIS — C3490 Malignant neoplasm of unspecified part of unspecified bronchus or lung: Secondary | ICD-10-CM

## 2011-11-27 NOTE — Telephone Encounter (Signed)
phyllis called and l/m to r/s may appt,  s/w diane bell and she will have to get dr mkm to add new ct orders,she will put in a new POF

## 2011-11-27 NOTE — Telephone Encounter (Signed)
R/s lab , ct from may

## 2011-11-30 ENCOUNTER — Telehealth: Payer: Self-pay | Admitting: Oncology

## 2011-11-30 NOTE — Telephone Encounter (Signed)
Scheduled pt for lb/ct 9/30 and f/u 10/2. Called pt @ [phone] and s/w pt's brother who stated pt no longer lives there and he does not see him. Per brother called pt @ [phone] and was not able to reach him or lm re appts (vm full). Pt was identified on vm. Also called number listed under mobile (pt's girlfriend Jamesetta So) and was also not able to reach her or lm (vm full). Schedule mailed. Desk nurse informed.

## 2011-12-10 ENCOUNTER — Ambulatory Visit (HOSPITAL_COMMUNITY): Payer: Medicare Other

## 2011-12-10 ENCOUNTER — Other Ambulatory Visit: Payer: Medicare Other | Admitting: Lab

## 2011-12-10 ENCOUNTER — Telehealth: Payer: Self-pay | Admitting: Internal Medicine

## 2011-12-10 NOTE — Telephone Encounter (Signed)
called to r/s appts this week as the pt is ill    aom

## 2011-12-12 ENCOUNTER — Ambulatory Visit: Payer: Medicare Other | Admitting: Internal Medicine

## 2011-12-12 ENCOUNTER — Telehealth: Payer: Self-pay | Admitting: Medical Oncology

## 2011-12-12 ENCOUNTER — Other Ambulatory Visit: Payer: Medicare Other | Admitting: Lab

## 2011-12-12 NOTE — Telephone Encounter (Signed)
CT scan r/s 

## 2011-12-21 ENCOUNTER — Ambulatory Visit (HOSPITAL_COMMUNITY)
Admission: RE | Admit: 2011-12-21 | Discharge: 2011-12-21 | Disposition: A | Discharge status: Home / Self Care | Payer: Medicare Other | Source: Ambulatory Visit | Attending: Internal Medicine | Admitting: Internal Medicine

## 2011-12-21 ENCOUNTER — Other Ambulatory Visit (HOSPITAL_BASED_OUTPATIENT_CLINIC_OR_DEPARTMENT_OTHER): Payer: Medicare Other | Admitting: Lab

## 2011-12-21 DIAGNOSIS — C3490 Malignant neoplasm of unspecified part of unspecified bronchus or lung: Secondary | ICD-10-CM

## 2011-12-21 DIAGNOSIS — Z9221 Personal history of antineoplastic chemotherapy: Secondary | ICD-10-CM | POA: Insufficient documentation

## 2011-12-21 LAB — COMPREHENSIVE METABOLIC PANEL
ALT: <8 U/L (ref 0–53)
AST: 12 U/L (ref 0–37)
Alkaline Phosphatase: 65 U/L (ref 39–117)
CO2: 25 mEq/L (ref 19–32)
Creatinine, Ser: 1.7 mg/dL — ABNORMAL HIGH (ref 0.50–1.35)
Sodium: 143 mEq/L (ref 135–145)
Total Bilirubin: 0.3 mg/dL (ref 0.3–1.2)
Total Protein: 6.3 g/dL (ref 6.0–8.3)

## 2011-12-21 LAB — CBC WITH DIFFERENTIAL/PLATELET
BASO%: 1.2 % (ref 0.0–2.0)
EOS%: 3.2 % (ref 0.0–7.0)
LYMPH%: 23 % (ref 14.0–49.0)
MCH: 28.8 pg (ref 27.2–33.4)
MCHC: 33.6 g/dL (ref 32.0–36.0)
MONO#: 0.5 10*3/uL (ref 0.1–0.9)
MONO%: 5.5 % (ref 0.0–14.0)
NEUT%: 67.1 % (ref 39.0–75.0)
Platelets: 229 10*3/uL (ref 140–400)
RBC: 4.87 10*6/uL (ref 4.20–5.82)
WBC: 9.3 10*3/uL (ref 4.0–10.3)

## 2011-12-24 ENCOUNTER — Telehealth: Payer: Self-pay | Admitting: Internal Medicine

## 2011-12-24 ENCOUNTER — Ambulatory Visit (HOSPITAL_BASED_OUTPATIENT_CLINIC_OR_DEPARTMENT_OTHER): Payer: Medicare Other | Admitting: Internal Medicine

## 2011-12-24 ENCOUNTER — Encounter: Payer: Self-pay | Admitting: Internal Medicine

## 2011-12-24 VITALS — BP 175/91 | HR 71 | Temp 97.1°F | Resp 20 | Ht 67.0 in | Wt 159.9 lb

## 2011-12-24 DIAGNOSIS — C778 Secondary and unspecified malignant neoplasm of lymph nodes of multiple regions: Secondary | ICD-10-CM

## 2011-12-24 DIAGNOSIS — C3490 Malignant neoplasm of unspecified part of unspecified bronchus or lung: Secondary | ICD-10-CM | POA: Insufficient documentation

## 2011-12-24 DIAGNOSIS — C3430 Malignant neoplasm of lower lobe, unspecified bronchus or lung: Secondary | ICD-10-CM

## 2011-12-24 NOTE — Progress Notes (Signed)
[facility] [phone]   [phone]  OFFICE PROGRESS NOTE  [name] Hoit, [name] [name] [address] [place] [address]  PRINCIPAL DIAGNOSIS:  1. Metastatic non-small cell lung cancer, adenocarcinoma diagnosed in June of 2009. 2. Renal insufficiency followed by Dr. Allena Katz. 3. History of seizure followed by Dr. Thad Ranger.  PRIOR THERAPY:  1. Status post 6 cycles of systemic chemotherapy with carboplatin, Alimta, and Avastin. Last dose was given December 25, 2007, and the patient had partial response to this treatment.  2. Status post maintenance chemotherapy with Alimta 500 mg per meter squared and Avastin 15 mg/kg given every 3 weeks, status post 23 cycles. Last dose was given May 23, 2009. The patient had stable disease but the treatment was discontinued secondary to toxicity and renal insufficiency.  CURRENT THERAPY: Observation.  INTERVAL HISTORY: [name] [name] 57 y.o. male returns to the clinic today for followup visit. The patient is a little bit anxious today as he has been off his anxiety and pain medication for a few weeks. He missed his appointment with his primary care physician and could not get refill of his medication until he sees Dr. Andrey Campanile. He denied having any significant chest pain or shortness breath today, has no cough or hemoptysis. No significant weight loss or night sweats. He has repeat CT scan of the chest, abdomen and pelvis performed recently and he is here today for evaluation and discussion of his scan results.  MEDICAL HISTORY: Past Medical History  Diagnosis Date  . Hypertension   . lung ca dx'd 05/2007    chemo comp 12/2009    ALLERGIES:   has no known allergies.  MEDICATIONS:  Current Outpatient Prescriptions  Medication Sig Dispense Refill  . ALPRAZolam (XANAX) 1 MG tablet Take 1 mg by mouth at bedtime as needed.      Marland Kitchen amLODipine (NORVASC) 5 MG tablet Take 5 mg by mouth daily.      Marland Kitchen co-enzyme Q-10 50 MG capsule  Take 100 mg by mouth daily.      . folic acid (FOLVITE) 1 MG tablet Take 1 mg by mouth daily.      . metoprolol (LOPRESSOR) 100 MG tablet Take 100 mg by mouth daily.      Marland Kitchen oxycodone (OXYCONTIN) 30 MG TB12 Take 30 mg by mouth every 8 (eight) hours.      Marland Kitchen oxyCODONE (ROXICODONE) 15 MG immediate release tablet Take 15 mg by mouth every 6 (six) hours as needed.      . vitamin B-12 (CYANOCOBALAMIN) 1000 MCG tablet Take 1,000 mcg by mouth daily.      . vitamin C (ASCORBIC ACID) 500 MG tablet Take 500 mg by mouth daily.      . vitamin E 100 UNIT capsule Take 100 Units by mouth daily.      Marland Kitchen zolpidem (AMBIEN) 10 MG tablet Take 10 mg by mouth at bedtime as needed.       REVIEW OF SYSTEMS:  A comprehensive review of systems was negative except for: Behavioral/Psych: positive for anxiety   PHYSICAL EXAMINATION: General appearance: alert, cooperative and no distress Head: Normocephalic, without obvious abnormality, atraumatic Neck: no adenopathy Lymph nodes: Cervical, supraclavicular, and axillary nodes normal. Resp: clear to auscultation bilaterally Cardio: regular rate and rhythm, S1, S2 normal, no murmur, click, rub or gallop GI: soft, non-tender; bowel sounds normal; no masses,  no organomegaly Extremities: extremities normal, atraumatic, no cyanosis or edema  ECOG PERFORMANCE STATUS: 0 - Asymptomatic  Blood pressure 175/91, pulse 71, temperature 97.1 F (36.2 C), temperature source Oral, resp. rate 20, height 5\' 7"  (1.702 m), weight 159 lb 14.4 oz (72.53 kg).  LABORATORY DATA: Lab Results  Component Value Date   WBC 9.3 12/21/2011   HGB 14.0 12/21/2011   HCT 41.8 12/21/2011   MCV 85.8 12/21/2011   PLT 229 12/21/2011      Chemistry      Component Value Date/Time   NA 143 12/21/2011 1429   NA 144 07/18/2011 1546   K 4.1 12/21/2011 1429   K 4.5 07/18/2011 1546   CL 109 12/21/2011 1429   CL 101 07/18/2011 1546   CO2 25 12/21/2011 1429   CO2 28 07/18/2011 1546   BUN 16 12/21/2011 1429    BUN 20 07/18/2011 1546   CREATININE 1.70* 12/21/2011 1429   CREATININE 1.8* 07/18/2011 1546      Component Value Date/Time   CALCIUM 9.2 12/21/2011 1429   CALCIUM 8.8 07/18/2011 1546   ALKPHOS 65 12/21/2011 1429   ALKPHOS 83 07/18/2011 1546   AST 12 12/21/2011 1429   AST 19 07/18/2011 1546   ALT <8 12/21/2011 1429   BILITOT 0.3 12/21/2011 1429   BILITOT 0.50 07/18/2011 1546       RADIOGRAPHIC STUDIES: Ct Chest Wo Contrast  12/21/2011  *RADIOLOGY REPORT*  Clinical Data:  Follow up lung cancer.  Chemotherapy complete.  CT CHEST, ABDOMEN AND PELVIS WITHOUT CONTRAST  Technique:  Multidetector CT imaging of the chest, abdomen and pelvis was performed following the standard protocol/  Comparison:  CT 05/08 1013   CT CHEST  Findings:  There is a nodule at the right lung base which measures 11 mm x 10 mm which is increased in size from 9 mm x 8 mm on prior.  In the more inferior right lower lobe there is a 7 mm x 15 mm nodule which is not changed from 16 x 7 mm on prior (image 49). There is an adjacent 6 x 8 mm nodule (image 50) which is not changed from 7 x 7 mm on prior.  There is a port in the right anterior chest wall.  No axillary or supraclavicular lymphadenopathy.  No mediastinal or hilar lymphadenopathy.  No pericardial fluid.  IMPRESSION: 1. Continued interval enlargement of right lower lobe pulmonary nodule is concerning for disease progression.  2.  Irregular nodular thickening at the more inferior right lower lobe warrants follow-up.   CT ABDOMEN AND PELVIS  Findings:  There is no focal hepatic lesion.  The gallbladder, pancreas, spleen, adrenal glands, and kidneys are normal.  Stomach, small bowel, appendix, cecum are normal.  The [name] rectosigmoid [name] are normal.  Abdominal aorta normal caliber.  No retroperitoneal periportal lymphadenopathy.  No free fluid the pelvis.  No pelvic lymphadenopathy.  The prostate gland and bladder normal.  IMPRESSION:  1.  No evidence of metastasis in the abdomen or  pelvis.   Original Report Authenticated By: Genevive Bi, M.D.     ASSESSMENT: This is a very pleasant 56 years old white male with metastatic non-small cell lung cancer status post systemic chemotherapy and has been observation since March of 2007 was no significant evidence for disease progression except for a slightly enlarging right lower lobe nodule. The increase of the size of this nodule is in the range of 2 mm.    PLAN: I discussed the scan results with the patient. I recommended for him to continue on observation for now with repeat CT scan  of the chest without contrast in 3 months for evaluation of his disease. We'll contact Dr. Tawana Scale office to make sure the patient has followup appointment sooner and to be able to have refill of his medications.  The patient was advised to call me immediately if he has any concerning symptoms in the interval. All questions were answered. The patient knows to call the clinic with any problems, questions or concerns. We can certainly see the patient much sooner if necessary.

## 2011-12-24 NOTE — Telephone Encounter (Signed)
gv and printed appt schedule to pt for DEC.Marland Kitchen

## 2011-12-24 NOTE — Patient Instructions (Signed)
Your CT scan of the chest showed a slight enlargement of one of the pulmonary nodule. We'll continue with observation for now with repeat CT scan of the chest in 3 months

## 2011-12-24 NOTE — Progress Notes (Signed)
Per patient request, PCP called to request that he be seen ASAP.  He was r/s for an appt that he missed and has been unable to get refills for his rx's.  Called and left a message at Dr Tawana Scale office and requested they contact pt if they can seen him sooner.  SLJ

## 2012-02-20 ENCOUNTER — Other Ambulatory Visit (HOSPITAL_BASED_OUTPATIENT_CLINIC_OR_DEPARTMENT_OTHER): Payer: Medicare Other | Admitting: Lab

## 2012-02-20 ENCOUNTER — Ambulatory Visit (HOSPITAL_COMMUNITY)
Admission: RE | Admit: 2012-02-20 | Discharge: 2012-02-20 | Disposition: A | Discharge status: Home / Self Care | Payer: Medicare Other | Source: Ambulatory Visit | Attending: Internal Medicine | Admitting: Internal Medicine

## 2012-02-20 DIAGNOSIS — C3490 Malignant neoplasm of unspecified part of unspecified bronchus or lung: Secondary | ICD-10-CM

## 2012-02-20 DIAGNOSIS — C778 Secondary and unspecified malignant neoplasm of lymph nodes of multiple regions: Secondary | ICD-10-CM

## 2012-02-20 DIAGNOSIS — C3430 Malignant neoplasm of lower lobe, unspecified bronchus or lung: Secondary | ICD-10-CM

## 2012-02-20 DIAGNOSIS — J90 Pleural effusion, not elsewhere classified: Secondary | ICD-10-CM | POA: Insufficient documentation

## 2012-02-20 DIAGNOSIS — Z9221 Personal history of antineoplastic chemotherapy: Secondary | ICD-10-CM | POA: Insufficient documentation

## 2012-02-20 LAB — CBC WITH DIFFERENTIAL/PLATELET
Basophils Absolute: 0.1 10*3/uL (ref 0.0–0.1)
Eosinophils Absolute: 0.6 10*3/uL — ABNORMAL HIGH (ref 0.0–0.5)
HGB: 14.1 g/dL (ref 13.0–17.1)
MCV: 87.1 fL (ref 79.3–98.0)
MONO%: 6.8 % (ref 0.0–14.0)
NEUT#: 5.4 10*3/uL (ref 1.5–6.5)
Platelets: 214 10*3/uL (ref 140–400)
RDW: 15.5 % — ABNORMAL HIGH (ref 11.0–14.6)

## 2012-02-20 LAB — COMPREHENSIVE METABOLIC PANEL (CC13)
Alkaline Phosphatase: 88 U/L (ref 40–150)
BUN: 15 mg/dL (ref 7.0–26.0)
CO2: 27 mEq/L (ref 22–29)
Creatinine: 1.6 mg/dL — ABNORMAL HIGH (ref 0.7–1.3)
Glucose: 101 mg/dl — ABNORMAL HIGH (ref 70–99)
Total Bilirubin: 0.27 mg/dL (ref 0.20–1.20)
Total Protein: 6.8 g/dL (ref 6.4–8.3)

## 2012-02-25 ENCOUNTER — Ambulatory Visit (HOSPITAL_BASED_OUTPATIENT_CLINIC_OR_DEPARTMENT_OTHER): Payer: Medicare Other | Admitting: Internal Medicine

## 2012-02-25 ENCOUNTER — Telehealth: Payer: Self-pay | Admitting: Internal Medicine

## 2012-02-25 VITALS — BP 171/93 | HR 68 | Temp 97.8°F | Resp 20 | Ht 67.0 in | Wt 156.8 lb

## 2012-02-25 DIAGNOSIS — C3490 Malignant neoplasm of unspecified part of unspecified bronchus or lung: Secondary | ICD-10-CM

## 2012-02-25 DIAGNOSIS — C3430 Malignant neoplasm of lower lobe, unspecified bronchus or lung: Secondary | ICD-10-CM

## 2012-02-25 DIAGNOSIS — C778 Secondary and unspecified malignant neoplasm of lymph nodes of multiple regions: Secondary | ICD-10-CM

## 2012-02-25 NOTE — Telephone Encounter (Signed)
Gave pt appt for March 2014 lab and MD, pt will be contacted by Radiology for CT

## 2012-02-25 NOTE — Patient Instructions (Addendum)
Your scan today is stable and showed no evidence for disease progression. We will continuous observation and repeat CT scan of the chest in 3 months

## 2012-02-25 NOTE — Progress Notes (Signed)
[facility] Telephone:(336) [phone]   Fax:(336) [phone]  OFFICE PROGRESS NOTE  Pamelia Hoit, MD [address] [place] [address]  PRINCIPAL DIAGNOSIS:  1. Metastatic non-small cell lung cancer, adenocarcinoma diagnosed in June of 2009. 2. Renal insufficiency followed by Dr. Allena Katz. 3. History of seizure followed by Dr. Thad Ranger.  PRIOR THERAPY:  1. Status post 6 cycles of systemic chemotherapy with carboplatin, Alimta, and Avastin. Last dose was given December 25, 2007, and the patient had partial response to this treatment.  2. Status post maintenance chemotherapy with Alimta 500 mg per meter squared and Avastin 15 mg/kg given every 3 weeks, status post 23 cycles. Last dose was given May 23, 2009. The patient had stable disease but the treatment was discontinued secondary to toxicity and renal insufficiency.  CURRENT THERAPY: Observation.  INTERVAL HISTORY:  [name] [name] 56 y.o. male returns to the clinic today for routine three-month followup visit accompanied his wife. The patient is feeling fine today with no specific complaints. He denied having any significant weight loss or night sweats. He denied having any chest pain, shortness breath, cough or hemoptysis. He has repeat CT scan of the chest performed recently and he is here for evaluation and discussion of his scan results.  MEDICAL HISTORY: Past Medical History  Diagnosis Date  . Hypertension   . lung ca dx'd 05/2007    chemo comp 12/2009    ALLERGIES:   has no known allergies.  MEDICATIONS:  Current Outpatient Prescriptions  Medication Sig Dispense Refill  . ALPRAZolam (XANAX) 1 MG tablet Take 1 mg by mouth at bedtime as needed.      Marland Kitchen amLODipine (NORVASC) 5 MG tablet Take 5 mg by mouth daily.      Marland Kitchen co-enzyme Q-10 50 MG capsule Take 100 mg by mouth daily.      . folic acid (FOLVITE) 1 MG tablet Take 1 mg by mouth daily.      . metoprolol (LOPRESSOR) 100 MG tablet Take 100 mg by mouth  daily.      Marland Kitchen oxycodone (OXYCONTIN) 30 MG TB12 Take 30 mg by mouth every 8 (eight) hours.      Marland Kitchen oxyCODONE (ROXICODONE) 15 MG immediate release tablet Take 15 mg by mouth every 6 (six) hours as needed.      . vitamin B-12 (CYANOCOBALAMIN) 1000 MCG tablet Take 1,000 mcg by mouth daily.      . vitamin C (ASCORBIC ACID) 500 MG tablet Take 500 mg by mouth daily.      . vitamin E 100 UNIT capsule Take 100 Units by mouth daily.      Marland Kitchen zolpidem (AMBIEN) 10 MG tablet Take 10 mg by mouth at bedtime as needed.        REVIEW OF SYSTEMS:  A comprehensive review of systems was negative.   PHYSICAL EXAMINATION: General appearance: alert, cooperative and no distress Head: Normocephalic, without obvious abnormality, atraumatic Neck: no adenopathy Lymph nodes: Cervical, supraclavicular, and axillary nodes normal. Resp: clear to auscultation bilaterally Cardio: regular rate and rhythm, S1, S2 normal, no murmur, click, rub or gallop GI: soft, non-tender; bowel sounds normal; no masses,  no organomegaly Extremities: extremities normal, atraumatic, no cyanosis or edema  ECOG PERFORMANCE STATUS: 0 - Asymptomatic  Blood pressure 171/93, pulse 68, temperature 97.8 F (36.6 C), temperature source Oral, resp. rate 20, height 5\' 7"  (1.702 m), weight 156 lb 12.8 oz (71.124 kg).  LABORATORY DATA: Lab Results  Component Value Date  WBC 9.2 02/20/2012   HGB 14.1 02/20/2012   HCT 42.2 02/20/2012   MCV 87.1 02/20/2012   PLT 214 02/20/2012      Chemistry      Component Value Date/Time   NA 144 02/20/2012 1516   NA 143 12/21/2011 1429   NA 144 07/18/2011 1546   K 4.1 02/20/2012 1516   K 4.1 12/21/2011 1429   K 4.5 07/18/2011 1546   CL 110* 02/20/2012 1516   CL 109 12/21/2011 1429   CL 101 07/18/2011 1546   CO2 27 02/20/2012 1516   CO2 25 12/21/2011 1429   CO2 28 07/18/2011 1546   BUN 15.0 02/20/2012 1516   BUN 16 12/21/2011 1429   BUN 20 07/18/2011 1546   CREATININE 1.6* 02/20/2012 1516   CREATININE 1.70*  12/21/2011 1429   CREATININE 1.8* 07/18/2011 1546      Component Value Date/Time   CALCIUM 9.8 02/20/2012 1516   CALCIUM 9.2 12/21/2011 1429   CALCIUM 8.8 07/18/2011 1546   ALKPHOS 88 02/20/2012 1516   ALKPHOS 65 12/21/2011 1429   ALKPHOS 83 07/18/2011 1546   AST 13 02/20/2012 1516   AST 12 12/21/2011 1429   AST 19 07/18/2011 1546   ALT 7 02/20/2012 1516   ALT <8 12/21/2011 1429   BILITOT 0.27 02/20/2012 1516   BILITOT 0.3 12/21/2011 1429   BILITOT 0.50 07/18/2011 1546       RADIOGRAPHIC STUDIES: Ct Chest Wo Contrast  02/20/2012  *RADIOLOGY REPORT*  Clinical Data: Lung cancer, evaluate for recurrence, chemotherapy complete  CT CHEST WITHOUT CONTRAST  Technique:  Multidetector CT imaging of the chest was performed following the standard protocol without IV contrast.  Comparison: 12/21/2011 and 07/18/2011  Findings: 11 x 11 mm right lower lobe nodule (series 5/image 37), similar to the most recent study, although increased from 07/18/2011.  Additional mild nodularity at the lateral right lung base, measuring 17 x 6 mm (series 5/image 45) and 9 mm (series 5/image 46), grossly unchanged.  Stable subpleural opacity along the right middle lobe (series 5/image 40), favored to reflect partially loculated pleural fluid with overlying atelectasis.  Associated small effusion, similar. No pneumothorax.  Visualized thyroid is unremarkable.  The heart is normal in size.  No pericardial effusion.  Coronary atherosclerosis.  Atherosclerotic calcifications of the aortic arch.  No suspicious mediastinal or axillary lymphadenopathy.  Right chest port.  Visualized upper abdomen is notable for suspected layering small gallstones (series 2/image 89).  Mild degenerative changes of the visualized thoracolumbar spine.  IMPRESSION: 11 mm right lower lobe nodule, grossly unchanged from the most recent study, although increased from 07/2011.  This appearance remains suspicious for tumor recurrence.  Additional mild nodularity  along the lateral right lung base, grossly unchanged.  Small right pleural effusion, partially loculated, unchanged.  Continued attention on follow-up is suggested.  If a more aggressive approach is desired, consider PET CT.   Original Report Authenticated By: Charline Bills, M.D.     ASSESSMENT: This is a very pleasant 56 years old white male with metastatic non-small cell lung cancer status post systemic chemotherapy and currently on observation. The patient has no evidence for disease progression.  PLAN: I discussed the scan results with the patient and his wife. I recommended for him to continue on observation for now with repeat CT scan of the chest without contrast in 3 months.  He was advised to call me immediately if he has any concerning symptoms in the interval.  All questions were answered.  The patient knows to call the clinic with any problems, questions or concerns. We can certainly see the patient much sooner if necessary.

## 2012-05-23 ENCOUNTER — Other Ambulatory Visit: Payer: Self-pay | Admitting: *Deleted

## 2012-05-23 ENCOUNTER — Other Ambulatory Visit: Payer: Medicare Other | Admitting: Lab

## 2012-05-26 ENCOUNTER — Ambulatory Visit: Payer: Medicare Other | Admitting: Internal Medicine

## 2012-05-27 ENCOUNTER — Ambulatory Visit (HOSPITAL_COMMUNITY): Payer: Medicare Other

## 2012-05-29 ENCOUNTER — Telehealth: Payer: Self-pay | Admitting: Internal Medicine

## 2012-09-05 ENCOUNTER — Telehealth: Payer: Self-pay | Admitting: Internal Medicine

## 2012-09-05 ENCOUNTER — Other Ambulatory Visit: Payer: Self-pay | Admitting: Internal Medicine

## 2012-09-05 NOTE — Telephone Encounter (Signed)
returned pt call and r/s to 7.9 and 7.16....advised pt that cs will call to sched ct.Marland KitchenMarland Kitchen

## 2012-09-17 ENCOUNTER — Other Ambulatory Visit: Payer: Self-pay | Admitting: Medical Oncology

## 2012-09-17 ENCOUNTER — Other Ambulatory Visit: Payer: Medicare Other | Admitting: Lab

## 2012-09-24 ENCOUNTER — Ambulatory Visit: Payer: Medicare Other | Admitting: Internal Medicine

## 2012-09-29 ENCOUNTER — Telehealth: Payer: Self-pay | Admitting: Internal Medicine

## 2012-09-29 NOTE — Telephone Encounter (Signed)
pt called to r/s appts due to pt having dialysis on tue and thursday.Marland KitchenMarland KitchenDone

## 2012-09-30 ENCOUNTER — Ambulatory Visit (HOSPITAL_COMMUNITY): Payer: Medicare Other

## 2012-09-30 ENCOUNTER — Other Ambulatory Visit: Payer: Medicare Other | Admitting: Lab

## 2012-10-01 ENCOUNTER — Ambulatory Visit: Payer: Medicare Other | Admitting: Internal Medicine

## 2012-10-06 ENCOUNTER — Other Ambulatory Visit (HOSPITAL_BASED_OUTPATIENT_CLINIC_OR_DEPARTMENT_OTHER): Payer: Medicare Other | Admitting: Lab

## 2012-10-06 ENCOUNTER — Ambulatory Visit (HOSPITAL_COMMUNITY)
Admission: RE | Admit: 2012-10-06 | Discharge: 2012-10-06 | Disposition: A | Discharge status: Home / Self Care | Payer: Medicare Other | Source: Ambulatory Visit | Attending: Internal Medicine | Admitting: Internal Medicine

## 2012-10-06 DIAGNOSIS — C3490 Malignant neoplasm of unspecified part of unspecified bronchus or lung: Secondary | ICD-10-CM | POA: Insufficient documentation

## 2012-10-06 DIAGNOSIS — I2510 Atherosclerotic heart disease of native coronary artery without angina pectoris: Secondary | ICD-10-CM | POA: Insufficient documentation

## 2012-10-06 DIAGNOSIS — I7090 Unspecified atherosclerosis: Secondary | ICD-10-CM | POA: Insufficient documentation

## 2012-10-06 DIAGNOSIS — Z9221 Personal history of antineoplastic chemotherapy: Secondary | ICD-10-CM | POA: Insufficient documentation

## 2012-10-06 DIAGNOSIS — I700 Atherosclerosis of aorta: Secondary | ICD-10-CM | POA: Insufficient documentation

## 2012-10-06 DIAGNOSIS — R918 Other nonspecific abnormal finding of lung field: Secondary | ICD-10-CM | POA: Insufficient documentation

## 2012-10-06 DIAGNOSIS — J90 Pleural effusion, not elsewhere classified: Secondary | ICD-10-CM | POA: Insufficient documentation

## 2012-10-06 LAB — CBC WITH DIFFERENTIAL/PLATELET
BASO%: 0.3 % (ref 0.0–2.0)
Basophils Absolute: 0 10*3/uL (ref 0.0–0.1)
EOS%: 4.5 % (ref 0.0–7.0)
Eosinophils Absolute: 0.4 10*3/uL (ref 0.0–0.5)
HCT: 38.1 % — ABNORMAL LOW (ref 38.4–49.9)
HGB: 12.4 g/dL — ABNORMAL LOW (ref 13.0–17.1)
LYMPH%: 17.7 % (ref 14.0–49.0)
MCH: 28.4 pg (ref 27.2–33.4)
MCHC: 32.5 g/dL (ref 32.0–36.0)
MCV: 87.2 fL (ref 79.3–98.0)
MONO#: 0.7 10*3/uL (ref 0.1–0.9)
MONO%: 8.3 % (ref 0.0–14.0)
NEUT#: 6.1 10*3/uL (ref 1.5–6.5)
NEUT%: 69.2 % (ref 39.0–75.0)
Platelets: 182 10*3/uL (ref 140–400)
RBC: 4.37 10*6/uL (ref 4.20–5.82)
RDW: 13.5 % (ref 11.0–14.6)
WBC: 8.9 10*3/uL (ref 4.0–10.3)
lymph#: 1.6 10*3/uL (ref 0.9–3.3)
nRBC: 0 % (ref 0–0)

## 2012-10-06 LAB — COMPREHENSIVE METABOLIC PANEL (CC13)
CO2: 28 mEq/L (ref 22–29)
Glucose: 111 mg/dl (ref 70–140)
Sodium: 147 mEq/L — ABNORMAL HIGH (ref 136–145)
Total Bilirubin: 0.23 mg/dL (ref 0.20–1.20)
Total Protein: 6.5 g/dL (ref 6.4–8.3)

## 2012-10-13 ENCOUNTER — Telehealth: Payer: Self-pay | Admitting: Internal Medicine

## 2012-10-13 ENCOUNTER — Encounter: Payer: Self-pay | Admitting: Internal Medicine

## 2012-10-13 ENCOUNTER — Ambulatory Visit (HOSPITAL_BASED_OUTPATIENT_CLINIC_OR_DEPARTMENT_OTHER): Payer: Medicare Other | Admitting: Internal Medicine

## 2012-10-13 DIAGNOSIS — C3430 Malignant neoplasm of lower lobe, unspecified bronchus or lung: Secondary | ICD-10-CM

## 2012-10-13 DIAGNOSIS — C778 Secondary and unspecified malignant neoplasm of lymph nodes of multiple regions: Secondary | ICD-10-CM

## 2012-10-13 DIAGNOSIS — N289 Disorder of kidney and ureter, unspecified: Secondary | ICD-10-CM

## 2012-10-13 NOTE — Telephone Encounter (Signed)
Pt sched to see Dr Kathrynn Running on 8.25.14 @ 2:00pm.lvm for the patient

## 2012-10-13 NOTE — Telephone Encounter (Signed)
gv and printed appt sched andavs for pt...pt did not want our barium and will get from home facitily .Marland KitchenMarland KitchenLm with Annye Rusk in radiation for Clydie Braun was on the phone and will call back to sched appt.

## 2012-10-13 NOTE — Progress Notes (Signed)
[facility] Telephone:([phone]   Fax:(336) [phone]  OFFICE PROGRESS NOTE  Pamelia Hoit, MD [address] [place] [address]  PRINCIPAL DIAGNOSIS AND STAGE:  1. Metastatic non-small cell lung cancer, adenocarcinoma diagnosed in June of 2009. 2. Renal insufficiency followed by Dr. Allena Katz. 3. History of seizure followed by Dr. Thad Ranger.  PRIOR THERAPY:  1. Status post 6 cycles of systemic chemotherapy with carboplatin, Alimta, and Avastin. Last dose was given December 25, 2007, and the patient had partial response to this treatment.  2. Status post maintenance chemotherapy with Alimta 500 mg per meter squared and Avastin 15 mg/kg given every 3 weeks, status post 23 cycles. Last dose was given May 23, 2009. The patient had stable disease but the treatment was discontinued secondary to toxicity and renal insufficiency.  CURRENT THERAPY: Observation.  CHEMOTHERAPY INTENT: Palliative  CURRENT # OF CHEMOTHERAPY CYCLES: 0  CURRENT ANTIEMETICS: N/A  CURRENT SMOKING STATUS: Occasional smoker, strongley advised to quit smoking.  ORAL CHEMOTHERAPY AND CONSENT: None  CURRENT BISPHOSPHONATES USE: None  PAIN MANAGEMENT:1/10  NARCOTICS INDUCED CONSTIPATION: N/A  LIVING WILL AND CODE STATUS: Full code.   INTERVAL HISTORY: Jazir Newey 57 y.o. male returns to the clinic today for followup visit accompanied by his girlfriend. The patient is doing fine today with no specific complaints. He denied having any significant chest pain, shortness breath, cough or hemoptysis. He denied having any weight loss or night sweats. He had repeat CT scan of the chest, abdomen and pelvis performed without contrast recently and he is here for evaluation and discussion of his scan results.  MEDICAL HISTORY: Past Medical History  Diagnosis Date  . Hypertension   . lung ca dx'd 05/2007    chemo comp 12/2009    ALLERGIES:  has No Known Allergies.  MEDICATIONS:  Current  Outpatient Prescriptions  Medication Sig Dispense Refill  . ALPRAZolam (XANAX) 1 MG tablet Take 1 mg by mouth 3 (three) times daily as needed.       Marland Kitchen amLODipine (NORVASC) 5 MG tablet Take 5 mg by mouth daily.      Marland Kitchen co-enzyme Q-10 50 MG capsule Take 100 mg by mouth daily.      . folic acid (FOLVITE) 1 MG tablet Take 1 mg by mouth daily.      . metoprolol (LOPRESSOR) 100 MG tablet Take 100 mg by mouth daily.      Marland Kitchen oxycodone (OXYCONTIN) 30 MG TB12 Take 30 mg by mouth every 8 (eight) hours.      Marland Kitchen oxyCODONE (ROXICODONE) 15 MG immediate release tablet Take 15 mg by mouth every 6 (six) hours as needed.      . sertraline (ZOLOFT) 50 MG tablet Take 50 mg by mouth daily.      . vitamin B-12 (CYANOCOBALAMIN) 1000 MCG tablet Take 1,000 mcg by mouth daily.      . vitamin E 100 UNIT capsule Take 100 Units by mouth daily.      Marland Kitchen zolpidem (AMBIEN) 10 MG tablet Take 10 mg by mouth at bedtime as needed.      . vitamin C (ASCORBIC ACID) 500 MG tablet Take 500 mg by mouth daily.       No current facility-administered medications for this visit.    REVIEW OF SYSTEMS:  A comprehensive review of systems was negative.   PHYSICAL EXAMINATION: General appearance: alert, cooperative and no distress Head: Normocephalic, without obvious abnormality, atraumatic Neck: no adenopathy Lymph nodes: Cervical, supraclavicular, and  axillary nodes normal. Resp: clear to auscultation bilaterally Cardio: regular rate and rhythm, S1, S2 normal, no murmur, click, rub or gallop GI: soft, non-tender; bowel sounds normal; no masses,  no organomegaly Extremities: extremities normal, atraumatic, no cyanosis or edema  ECOG PERFORMANCE STATUS: 0 - Asymptomatic  Blood pressure 163/78, pulse 59, temperature 97.4 F (36.3 C), temperature source Oral, resp. rate 20, height 5\' 7"  (1.702 m), weight 160 lb 9.6 oz (72.848 kg).  LABORATORY DATA: Lab Results  Component Value Date   WBC 8.9 10/06/2012   HGB 12.4* 10/06/2012   HCT 38.1*  10/06/2012   MCV 87.2 10/06/2012   PLT 182 10/06/2012      Chemistry      Component Value Date/Time   NA 147* 10/06/2012 1010   NA 143 12/21/2011 1429   NA 144 07/18/2011 1546   K 3.9 10/06/2012 1010   K 4.1 12/21/2011 1429   K 4.5 07/18/2011 1546   CL 110* 02/20/2012 1516   CL 109 12/21/2011 1429   CL 101 07/18/2011 1546   CO2 28 10/06/2012 1010   CO2 25 12/21/2011 1429   CO2 28 07/18/2011 1546   BUN 18.5 10/06/2012 1010   BUN 16 12/21/2011 1429   BUN 20 07/18/2011 1546   CREATININE 1.6* 10/06/2012 1010   CREATININE 1.70* 12/21/2011 1429   CREATININE 1.8* 07/18/2011 1546      Component Value Date/Time   CALCIUM 9.1 10/06/2012 1010   CALCIUM 9.2 12/21/2011 1429   CALCIUM 8.8 07/18/2011 1546   ALKPHOS 71 10/06/2012 1010   ALKPHOS 65 12/21/2011 1429   ALKPHOS 83 07/18/2011 1546   AST 13 10/06/2012 1010   AST 12 12/21/2011 1429   AST 19 07/18/2011 1546   ALT 6 10/06/2012 1010   ALT <8 12/21/2011 1429   ALT 12 07/18/2011 1546   BILITOT 0.23 10/06/2012 1010   BILITOT 0.3 12/21/2011 1429   BILITOT 0.50 07/18/2011 1546       RADIOGRAPHIC STUDIES: Ct Chest Wo Contrast  10/06/2012   *RADIOLOGY REPORT*  Clinical Data:  History of lung cancer.  Chemotherapy complete. Restaging scan.  CT CHEST, ABDOMEN AND PELVIS WITHOUT CONTRAST  Technique:  Multidetector CT imaging of the chest, abdomen and pelvis was performed following the standard protocol without IV contrast.  Comparison:  CT of the chest 02/20/2012.  CT of the abdomen and pelvis 12/21/2011.    CT CHEST  Findings:  Mediastinum: Right internal jugular single lumen Port-A-Cath with tip terminating in the superior aspect of the right atrium. Calcifications of the aortic valve. Heart size is normal. There is no significant pericardial fluid, thickening or pericardial calcification. There is atherosclerosis of the thoracic aorta, the great vessels of the mediastinum and the coronary arteries, including calcified atherosclerotic plaque in the left main, left  anterior descending and right coronary arteries. No pathologically enlarged mediastinal or hilar lymph nodes. Please note that accurate exclusion of hilar adenopathy is limited on noncontrast CT scans.  Esophagus is unremarkable in appearance.  Lungs/Pleura: When compared to prior examinations there has been significant interval enlargement of the previously described right lower lobe nodule.  This nodule currently measures 2.0 x 1.9 cm and has macrolobulated and spiculated margins with some retraction of the overlying pleura (highly aggressive features).  There is a band of plate-like atelectasis along the posterior margin of this nodule on today's examination within the right lower lobe.  Additionally, the irregular shaped pleural based nodule in the periphery of the right lower lobe (image  47 of series 4) appears significantly larger than prior examinations as well, currently measuring up to 3.0 x 1.4 cm.  This is irregularly shaped with some surrounding spiculations and retraction of the adjacent pleura.  In the periphery of the lateral segment of the right middle lobe there is also a pleural based mass like opacity best demonstrated on image 43 of series 4, which currently measures approximately 4.1 x 1.3 cm and has some associated "pleural tails," which may suggest a region of evolving round atelectasis, however, the appearance remains nonspecific.  No acute consolidative airspace disease.  Fine centrilobular micronodularity is noted in the inferior aspect of the lingula, likely to represent some mucoid impaction within terminal bronchioles.  Small right-sided pleural effusion layers dependently, but has increased slightly in size compared to the prior study.  Musculoskeletal: There are no aggressive appearing lytic or blastic lesions noted in the visualized portions of the skeleton.  IMPRESSION:  1.  Interval enlargement of the two dominant pulmonary nodules in the right lower lobe, as detailed above, highly  concerning for progression of neoplasm. 2.  There has also been enlargement of a pleural-based mass like plaque in the periphery of the lateral segment of the right middle lobe.  The imaging characteristics of this lesion could be consistent with neoplasm, however, this may simply represent evolving round atelectasis. 3.  No evidence of mediastinal or hilar lymphadenopathy at this time. 4.  For further evaluation of all of these lesions and to better discriminate between neoplastic and benign processes, evaluation with PET CT may be warranted. 5. Atherosclerosis, including left main and two-vessel coronary artery disease.  Assessment for potential risk factor modification, dietary therapy or pharmacologic therapy may be warranted, if clinically indicated. 6.  Slight increase in small right-sided pleural effusion. 7.  Additional incidental findings, as above.    CT ABDOMEN AND PELVIS  Findings:  Abdomen/Pelvis: Small amount of high attenuation material layering dependently in the gallbladder likely reflects biliary sludge or tiny gallstones.  No evidence to suggest acute cholecystitis at this time.  The unenhanced appearance of the liver, pancreas, spleen and bilateral adrenal glands is unremarkable.  No focal renal lesions are noted on today's noncontrast CT examination, however, there is extensive bilateral perinephric stranding (a nonspecific finding which can be seen in the setting of renal insufficiency).  No hydroureteronephrosis to suggest urinary tract obstruction at this time.  No abnormal calcifications within the collecting system of either kidney, along the course of either ureter, or within the lumen of the urinary bladder. Atherosclerosis throughout the abdominal and pelvic vasculature.  No significant volume of ascites.  No pneumoperitoneum.  No pathologic distension of small bowel.  No definite pathologic lymphadenopathy identified within the abdomen or pelvis on today's noncontrast CT examination.   Normal appendix.  Multifocal calcifications within the prostate gland (nonspecific).  Urinary bladder is unremarkable in appearance.  Musculoskeletal: There are no aggressive appearing lytic or blastic lesions noted in the visualized portions of the skeleton.  IMPRESSION:  1.  No definitive findings to suggest metastatic disease to the abdomen or pelvis on today's examination. 2.  Extensive atherosclerosis. 3.  Small amount of biliary sludge or tiny gallstones layering dependently in the gallbladder.  No findings to suggest acute cholecystitis at this time. 4.  Additional incidental findings, as above.   Original Report Authenticated By: Trudie Reed, M.D.    ASSESSMENT AND PLAN: This is a very pleasant 57 years old white male with history of metastatic non-small cell lung  cancer, adenocarcinoma status post systemic chemotherapy was carboplatin, Alimta and Avastin followed by maintenance Alimta and Avastin but has been observation since March of 2011. His recent scan showed evidence for disease progression in 2 dominant nodules in the right lower lobe.  I discussed the scan results and showed the images to the patient and his girlfriend. I recommended for him to see Dr. Kathrynn Running for evaluation and consideration of stereotactic radiotherapy to the enlarging right lung nodules. I will see him back for followup visit in 6 months with repeat CT scan of the chest, abdomen and pelvis without contrast. He was advised to call immediately if he has any concerning symptoms in the interval.  The patient voices understanding of current disease status and treatment options and is in agreement with the current care plan.  All questions were answered. The patient knows to call the clinic with any problems, questions or concerns. We can certainly see the patient much sooner if necessary.  I spent 15 minutes counseling the patient face to face. The total time spent in the appointment was 25 minutes.

## 2012-10-13 NOTE — Patient Instructions (Signed)
CURRENT THERAPY: Observation.  CHEMOTHERAPY INTENT: Palliative  CURRENT # OF CHEMOTHERAPY CYCLES: 0  CURRENT ANTIEMETICS: N/A  CURRENT SMOKING STATUS: Occasional smoker, strongley advised to quit smoking.  ORAL CHEMOTHERAPY AND CONSENT: None  CURRENT BISPHOSPHONATES USE: None  PAIN MANAGEMENT:1/10  NARCOTICS INDUCED CONSTIPATION: N/A  LIVING WILL AND CODE STATUS: Full code.

## 2012-11-02 ENCOUNTER — Encounter: Payer: Self-pay | Admitting: Radiation Oncology

## 2012-11-02 NOTE — Progress Notes (Signed)
Radiation Oncology         [phone]) [phone] ________________________________  Initial outpatient Consultation  Name: [name] [name] MRN: [id number]  Date: 11/03/2012  DOB: 02-Apr-1955  IO:[id number],WUXL Sherilyn Cooter, MD  Si Gaul, MD   REFERRING PHYSICIAN: Si Gaul, MD  DIAGNOSIS: 57 year old stage IV adenocarcinoma of the lung  HISTORY OF PRESENT ILLNESS::[name] [name] is a 57 y.o. male well known to me as I took care of his brother, [name],  who was diagnosed in 2008 with head and neck cancer and Jancarlos was the caregiver for his brother.  When he was originially diagnosed, he noticed a lump in the right neck area.  He went to his primary care physician Dr. Ace Gins for routine evaluation and he showed her the lump.  She gave him a course of antibiotic for questionable tooth abscess, but there was no significant improvement then the patient had CT scan of the neck and chest performed on August 11, 2007.  The CT scan of the neck showed enlarged left supraclavicular lymph nodes concerning for metastatic disease.  CT of the chest performed on the same day showed an enlarged left supraclavicular, retropectoral and left axillary lymph nodes.  There was also left pectoral lymph nodes measuring 1.4 x 1.8 cm and left axillary lymph node measured 3.5 x 1.6 cm.  There were multiple enlarged mediastinal and right hilar lymph nodes as well as right paratracheal lymph nodes that measured 2.0 x 1.9 cm a precarinal lymph node measured 1.2 x 1.5 cm and a small right pleural effusion was present.  Within the right lower lobe, there was a pulmonary mass, which measured 2.6 x 1.7 cm in addition to innumerable tiny nodules throughout the right lung parenchyma.  There was also diffuse interlobular septal thickening throughout the right lung concerning for lymphangitic spread of the tumor in addition to tiny nodules identified within the left lung, which were suspicious for foci of metastases.  The patient was then referred to  Dr. Cornelius Moras and on August 19, 2007.  He underwent left supraclavicular lymph node biopsy.  The pathology case number [id number] showed that the biopsy of the left supraclavicular lymph node was consistent with metastatic non-small-cell carcinoma consistent with adenocarcinoma.  The immunohistochemical profile on the cancer cell showed that the tumor cells were positive for TTF-1 and strongly positive for cytokeratin 7; but negative for cytokeratin 20, CDX2, prostate acid phosphatase as well as prostate specific antigen.  The findings were consistent with metastases from a lung primary.  The patient also had a PET scan as well as MRI of the brain performed on August 20, 2007.  The PET scan showed peripheral right middle lobe pulmonary mass with intense hypermetabolic active likely representing primary bronchogenic carcinoma.  There was a scattered nodular density within the right upper lobe, right lower lobe that could represent metastases versus pneumonitis.  There was also potential pleural metastases in the right lower lobe and extensive nodal metastases including the bilateral deep neck regions, left supraclavicular nodal stations, extensive left axillary nodal metastases, left and right mediastinal metastases and superior and inferior right hilar metastases.  There was no clear evidence of metastases below diaphragm and there was a small retrocrural node and small periaortic node with low level FDG activity that was nonspecific.  MRI of the brain performed on the same day was negative for intracranial metastatic disease. He went on to receive 6 cycles of systemic chemotherapy with carboplatin, Alimta, and Avastin.  Last dose was given December 25, 2007, and  the patient had partial response to this treatment.  Then he received maintenance chemotherapy with Alimta 500 mg per meter squared and Avastin 15 mg/kg given every 3 weeks, status post 23 cycles.  Last dose was given May 23, 2009.  The patient had stable disease but  the treatment was discontinued secondary to toxicity and renal insufficiency.  During routine followup, the patient underwent CT scans of the chest abdomen and pelvis on 10/06/2012 . The patient was noted to have Interval enlargement of the two dominant pulmonary nodules in the right lower lobe, as detailed above, highly concerning for progression of neoplasm. Also, there has been enlargement of a pleural-based mass like plaque in the periphery of the lateral segment of the right middle lobe.   The patient has kindly been referred today to discuss possible stereotactic body radiotherapy in the setting of progressive oligometastatic disease with to enlarging lung nodules.  PREVIOUS RADIATION THERAPY: No  PAST MEDICAL HISTORY:  has a past medical history of Hypertension; lung ca (dx'd 05/2007); Lung cancer (dx 2009); and antineoplastic chemo (2011).    PAST SURGICAL HISTORY:History reviewed. No pertinent past surgical history.  FAMILY HISTORY: family history is negative for Cancer.  SOCIAL HISTORY:  reports that he has been smoking Cigarettes.  He has been smoking about 0.00 packs per day. He has never used smokeless tobacco. He reports that he does not drink alcohol or use illicit drugs.  ALLERGIES: Review of patient's allergies indicates no known allergies.  MEDICATIONS:  Current Outpatient Prescriptions  Medication Sig Dispense Refill  . ALPRAZolam (XANAX) 1 MG tablet Take 1 mg by mouth 3 (three) times daily as needed.       Marland Kitchen amLODipine (NORVASC) 5 MG tablet Take 5 mg by mouth daily.      Marland Kitchen co-enzyme Q-10 50 MG capsule Take 100 mg by mouth daily.      . folic acid (FOLVITE) 1 MG tablet Take 1 mg by mouth daily.      . metoprolol (LOPRESSOR) 100 MG tablet Take 100 mg by mouth daily.      Marland Kitchen oxycodone (OXYCONTIN) 30 MG TB12 Take 30 mg by mouth every 8 (eight) hours.      Marland Kitchen oxyCODONE (ROXICODONE) 15 MG immediate release tablet Take 15 mg by mouth every 6 (six) hours as needed.      .  sertraline (ZOLOFT) 50 MG tablet Take 50 mg by mouth daily.      . vitamin B-12 (CYANOCOBALAMIN) 1000 MCG tablet Take 1,000 mcg by mouth daily.      . vitamin C (ASCORBIC ACID) 500 MG tablet Take 500 mg by mouth daily.      . vitamin E 100 UNIT capsule Take 100 Units by mouth daily.      Marland Kitchen zolpidem (AMBIEN) 10 MG tablet Take 10 mg by mouth at bedtime as needed.       No current facility-administered medications for this encounter.    REVIEW OF SYSTEMS:  A 15 point review of systems is documented in the electronic medical record. This was obtained by the nursing staff. However, I reviewed this with the patient to discuss relevant findings and make appropriate changes.  Pertinent items are noted in HPI.   PHYSICAL EXAM:  height is 5\' 10"  (1.778 m) and weight is 161 lb (73.029 kg). His oral temperature is 98.3 F (36.8 C). His blood pressure is 112/87 and his pulse is 61. His respiration is 16 and oxygen saturation is 98%.   per med-onc  General appearance: alert, cooperative and no distress Head: Normocephalic, without obvious abnormality, atraumatic  Neck: no adenopathy Lymph nodes: Cervical, supraclavicular, and axillary nodes normal.  Resp: clear to auscultation bilaterallyCardio: regular rate and rhythm, S1, S2 normal, no murmur, click, rub or gallop  GI: soft, non-tender; bowel sounds normal; no masses, no organomegaly Extremities: extremities normal, atraumatic, no cyanosis or edema  KPS = 100  LABORATORY DATA:  Lab Results  Component Value Date   WBC 8.9 10/06/2012   HGB 12.4* 10/06/2012   HCT 38.1* 10/06/2012   MCV 87.2 10/06/2012   PLT 182 10/06/2012   Lab Results  Component Value Date   NA 147* 10/06/2012   K 3.9 10/06/2012   CL 110* 02/20/2012   CO2 28 10/06/2012   Lab Results  Component Value Date   ALT 6 10/06/2012   AST 13 10/06/2012   ALKPHOS 71 10/06/2012   BILITOT 0.23 10/06/2012     RADIOGRAPHY: Ct Abdomen Pelvis Wo Contrast  10/06/2012   *RADIOLOGY REPORT*  Clinical  Data:  History of lung cancer.  Chemotherapy complete. Restaging scan.  CT CHEST, ABDOMEN AND PELVIS WITHOUT CONTRAST  Technique:  Multidetector CT imaging of the chest, abdomen and pelvis was performed following the standard protocol without IV contrast.  Comparison:  CT of the chest 02/20/2012.  CT of the abdomen and pelvis 12/21/2011.  CT CHEST  Findings:  Mediastinum: Right internal jugular single lumen Port-A-Cath with tip terminating in the superior aspect of the right atrium. Calcifications of the aortic valve. Heart size is normal. There is no significant pericardial fluid, thickening or pericardial calcification. There is atherosclerosis of the thoracic aorta, the great vessels of the mediastinum and the coronary arteries, including calcified atherosclerotic plaque in the left main, left anterior descending and right coronary arteries. No pathologically enlarged mediastinal or hilar lymph nodes. Please note that accurate exclusion of hilar adenopathy is limited on noncontrast CT scans.  Esophagus is unremarkable in appearance.  Lungs/Pleura: When compared to prior examinations there has been significant interval enlargement of the previously described right lower lobe nodule.  This nodule currently measures 2.0 x 1.9 cm and has macrolobulated and spiculated margins with some retraction of the overlying pleura (highly aggressive features).  There is a band of plate-like atelectasis along the posterior margin of this nodule on today's examination within the right lower lobe.  Additionally, the irregular shaped pleural based nodule in the periphery of the right lower lobe (image 47 of series 4) appears significantly larger than prior examinations as well, currently measuring up to 3.0 x 1.4 cm.  This is irregularly shaped with some surrounding spiculations and retraction of the adjacent pleura.  In the periphery of the lateral segment of the right middle lobe there is also a pleural based mass like opacity  best demonstrated on image 43 of series 4, which currently measures approximately 4.1 x 1.3 cm and has some associated "pleural tails," which may suggest a region of evolving round atelectasis, however, the appearance remains nonspecific.  No acute consolidative airspace disease.  Fine centrilobular micronodularity is noted in the inferior aspect of the lingula, likely to represent some mucoid impaction within terminal bronchioles.  Small right-sided pleural effusion layers dependently, but has increased slightly in size compared to the prior study.  Musculoskeletal: There are no aggressive appearing lytic or blastic lesions noted in the visualized portions of the skeleton.  IMPRESSION:  1.  Interval enlargement of the two dominant pulmonary nodules in the right lower lobe, as  detailed above, highly concerning for progression of neoplasm. 2.  There has also been enlargement of a pleural-based mass like plaque in the periphery of the lateral segment of the right middle lobe.  The imaging characteristics of this lesion could be consistent with neoplasm, however, this may simply represent evolving round atelectasis. 3.  No evidence of mediastinal or hilar lymphadenopathy at this time. 4.  For further evaluation of all of these lesions and to better discriminate between neoplastic and benign processes, evaluation with PET CT may be warranted. 5. Atherosclerosis, including left main and two-vessel coronary artery disease.  Assessment for potential risk factor modification, dietary therapy or pharmacologic therapy may be warranted, if clinically indicated. 6.  Slight increase in small right-sided pleural effusion. 7.  Additional incidental findings, as above.  CT ABDOMEN AND PELVIS  Findings:  Abdomen/Pelvis: Small amount of high attenuation material layering dependently in the gallbladder likely reflects biliary sludge or tiny gallstones.  No evidence to suggest acute cholecystitis at this time.  The unenhanced  appearance of the liver, pancreas, spleen and bilateral adrenal glands is unremarkable.  No focal renal lesions are noted on today's noncontrast CT examination, however, there is extensive bilateral perinephric stranding (a nonspecific finding which can be seen in the setting of renal insufficiency).  No hydroureteronephrosis to suggest urinary tract obstruction at this time.  No abnormal calcifications within the collecting system of either kidney, along the course of either ureter, or within the lumen of the urinary bladder. Atherosclerosis throughout the abdominal and pelvic vasculature.  No significant volume of ascites.  No pneumoperitoneum.  No pathologic distension of small bowel.  No definite pathologic lymphadenopathy identified within the abdomen or pelvis on today's noncontrast CT examination.  Normal appendix.  Multifocal calcifications within the prostate gland (nonspecific).  Urinary bladder is unremarkable in appearance.  Musculoskeletal: There are no aggressive appearing lytic or blastic lesions noted in the visualized portions of the skeleton.  IMPRESSION:  1.  No definitive findings to suggest metastatic disease to the abdomen or pelvis on today's examination. 2.  Extensive atherosclerosis. 3.  Small amount of biliary sludge or tiny gallstones layering dependently in the gallbladder.  No findings to suggest acute cholecystitis at this time. 4.  Additional incidental findings, as above.   Original Report Authenticated By: Trudie Reed, M.D.   Ct Chest Wo Contrast  10/06/2012   *RADIOLOGY REPORT*  Clinical Data:  History of lung cancer.  Chemotherapy complete. Restaging scan.  CT CHEST, ABDOMEN AND PELVIS WITHOUT CONTRAST  Technique:  Multidetector CT imaging of the chest, abdomen and pelvis was performed following the standard protocol without IV contrast.  Comparison:  CT of the chest 02/20/2012.  CT of the abdomen and pelvis 12/21/2011.  CT CHEST  Findings:  Mediastinum: Right internal  jugular single lumen Port-A-Cath with tip terminating in the superior aspect of the right atrium. Calcifications of the aortic valve. Heart size is normal. There is no significant pericardial fluid, thickening or pericardial calcification. There is atherosclerosis of the thoracic aorta, the great vessels of the mediastinum and the coronary arteries, including calcified atherosclerotic plaque in the left main, left anterior descending and right coronary arteries. No pathologically enlarged mediastinal or hilar lymph nodes. Please note that accurate exclusion of hilar adenopathy is limited on noncontrast CT scans.  Esophagus is unremarkable in appearance.  Lungs/Pleura: When compared to prior examinations there has been significant interval enlargement of the previously described right lower lobe nodule.  This nodule currently measures 2.0 x 1.9  cm and has macrolobulated and spiculated margins with some retraction of the overlying pleura (highly aggressive features).  There is a band of plate-like atelectasis along the posterior margin of this nodule on today's examination within the right lower lobe.  Additionally, the irregular shaped pleural based nodule in the periphery of the right lower lobe (image 47 of series 4) appears significantly larger than prior examinations as well, currently measuring up to 3.0 x 1.4 cm.  This is irregularly shaped with some surrounding spiculations and retraction of the adjacent pleura.  In the periphery of the lateral segment of the right middle lobe there is also a pleural based mass like opacity best demonstrated on image 43 of series 4, which currently measures approximately 4.1 x 1.3 cm and has some associated "pleural tails," which may suggest a region of evolving round atelectasis, however, the appearance remains nonspecific.  No acute consolidative airspace disease.  Fine centrilobular micronodularity is noted in the inferior aspect of the lingula, likely to represent some  mucoid impaction within terminal bronchioles.  Small right-sided pleural effusion layers dependently, but has increased slightly in size compared to the prior study.  Musculoskeletal: There are no aggressive appearing lytic or blastic lesions noted in the visualized portions of the skeleton.  IMPRESSION:  1.  Interval enlargement of the two dominant pulmonary nodules in the right lower lobe, as detailed above, highly concerning for progression of neoplasm. 2.  There has also been enlargement of a pleural-based mass like plaque in the periphery of the lateral segment of the right middle lobe.  The imaging characteristics of this lesion could be consistent with neoplasm, however, this may simply represent evolving round atelectasis. 3.  No evidence of mediastinal or hilar lymphadenopathy at this time. 4.  For further evaluation of all of these lesions and to better discriminate between neoplastic and benign processes, evaluation with PET CT may be warranted. 5. Atherosclerosis, including left main and two-vessel coronary artery disease.  Assessment for potential risk factor modification, dietary therapy or pharmacologic therapy may be warranted, if clinically indicated. 6.  Slight increase in small right-sided pleural effusion. 7.  Additional incidental findings, as above.  CT ABDOMEN AND PELVIS  Findings:  Abdomen/Pelvis: Small amount of high attenuation material layering dependently in the gallbladder likely reflects biliary sludge or tiny gallstones.  No evidence to suggest acute cholecystitis at this time.  The unenhanced appearance of the liver, pancreas, spleen and bilateral adrenal glands is unremarkable.  No focal renal lesions are noted on today's noncontrast CT examination, however, there is extensive bilateral perinephric stranding (a nonspecific finding which can be seen in the setting of renal insufficiency).  No hydroureteronephrosis to suggest urinary tract obstruction at this time.  No abnormal  calcifications within the collecting system of either kidney, along the course of either ureter, or within the lumen of the urinary bladder. Atherosclerosis throughout the abdominal and pelvic vasculature.  No significant volume of ascites.  No pneumoperitoneum.  No pathologic distension of small bowel.  No definite pathologic lymphadenopathy identified within the abdomen or pelvis on today's noncontrast CT examination.  Normal appendix.  Multifocal calcifications within the prostate gland (nonspecific).  Urinary bladder is unremarkable in appearance.  Musculoskeletal: There are no aggressive appearing lytic or blastic lesions noted in the visualized portions of the skeleton.  IMPRESSION:  1.  No definitive findings to suggest metastatic disease to the abdomen or pelvis on today's examination. 2.  Extensive atherosclerosis. 3.  Small amount of biliary sludge or tiny  gallstones layering dependently in the gallbladder.  No findings to suggest acute cholecystitis at this time. 4.  Additional incidental findings, as above.   Original Report Authenticated By: Trudie Reed, M.D.      IMPRESSION: This patient is a very nice 57 year old gentleman with stage IV adenocarcinoma of the lung for longer than 5 years. He responded well to systemic chemotherapy and currently has no evidence of disease except 2 isolated progressive pulmonary nodules.   PLAN:  Today, I talked to the patient and his girlfriend about the findings and work-up thus far.  We discussed the natural history of oligo metastatic disease and general treatment, highlighting the role of ablative stereotactic radiotherapy in the management.  We discussed the available radiation techniques, and focused on the details of logistics and delivery.  We reviewed the anticipated acute and late sequelae associated with radiation in this setting.  The patient was encouraged to ask questions that I answered to the best of my ability.  I filled out a patient  counseling form during our discussion including treatment diagrams.  We retained a copy for our records.  The patient would like to proceed with radiation and will be scheduled for CT simulation.  I spent 60 minutes minutes face to face with the patient and more than 50% of that time was spent in counseling and/or coordination of care.  ------------------------------------------------  Artist Pais. Kathrynn Running, M.D.

## 2012-11-03 ENCOUNTER — Encounter: Payer: Self-pay | Admitting: Radiation Oncology

## 2012-11-03 ENCOUNTER — Ambulatory Visit
Admission: RE | Admit: 2012-11-03 | Discharge: 2012-11-03 | Disposition: A | Discharge status: Home / Self Care | Payer: Medicare Other | Source: Ambulatory Visit | Attending: Radiation Oncology | Admitting: Radiation Oncology

## 2012-11-03 DIAGNOSIS — F17200 Nicotine dependence, unspecified, uncomplicated: Secondary | ICD-10-CM | POA: Insufficient documentation

## 2012-11-03 DIAGNOSIS — I700 Atherosclerosis of aorta: Secondary | ICD-10-CM | POA: Insufficient documentation

## 2012-11-03 DIAGNOSIS — I2510 Atherosclerotic heart disease of native coronary artery without angina pectoris: Secondary | ICD-10-CM | POA: Insufficient documentation

## 2012-11-03 DIAGNOSIS — C3490 Malignant neoplasm of unspecified part of unspecified bronchus or lung: Secondary | ICD-10-CM | POA: Insufficient documentation

## 2012-11-03 DIAGNOSIS — I10 Essential (primary) hypertension: Secondary | ICD-10-CM | POA: Insufficient documentation

## 2012-11-03 DIAGNOSIS — Z9221 Personal history of antineoplastic chemotherapy: Secondary | ICD-10-CM | POA: Insufficient documentation

## 2012-11-03 NOTE — Progress Notes (Signed)
Histology and Location of Primary Cancer: Non small cell lung adenocarcinoma diagnosed in June 2009.   Location(s) of Symptomatic tumor(s): Denies any specific complaints; palliation  Past/Anticipated chemotherapy by medical oncology, if any: S/P 6 cycles of systemic chemotherapy with carbo, alimta and avastin. Last dose was given 12/25/2007 and patient had partial response to treatment. S/P maintenance chemo  With alimta and avastin. Last dose given 05/23/09. Chemo d/c secondary to toxicity and renal insufficiency.  Patient's main complaints related to symptomatic tumor(s) are: Denies significant chest pain, shortness of breath, cough or hemoptysis.   Pain on a scale of 0-10 is: Denies    If Spine Met(s), symptoms, if any, include:  Bowel/Bladder retention or incontinence (please describe): Denies  Numbness or weakness in extremities (please describe): Denies  Current Decadron regimen, if applicable: No  Ambulatory status? Walker? Wheelchair?: Ambulatory  SAFETY ISSUES:  Prior radiation? No  Pacemaker/ICD? No  Possible current pregnancy? No  Is the patient on methotrexate? No  Additional Complaints / other details:  57 year old male. Referred by Dr. Arbutus Ped because recent scan showed evidence for disease progression in 2 dominant nodules in the right lower lob. To see Dr. Kathrynn Running for evaluation and consideration of SRS to the enlarging right lung nodules.

## 2012-11-03 NOTE — Progress Notes (Signed)
See progress note under physician encounter. 

## 2012-11-03 NOTE — Progress Notes (Signed)
Denies pain at this time. 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 Reports periodic night sweats. Denies cough, hemoptysis, or shortness of breath. Denies pain or difficulty swallowing. Denies chest pain. Cared for his brother, Kyland No, during his treatment for throat cancer. Reports his brother was a former patient of Dr. Kathrynn Running. Reports he and his brother hardly speak now and this creates great sadness for him. Anxiety noted. Very talkative and needs frequent redirecting. Denies nausea, vomiting, headache or dizziness. Denies weight loss or decreased appetite.

## 2012-11-04 NOTE — Progress Notes (Signed)
Complete PATIENT MEASURE OF DISTRESS worksheet with a score of 7 submitted to social work.  

## 2012-11-04 NOTE — Addendum Note (Signed)
Encounter addended by: Agnes Lawrence, RN on: 11/04/2012 10:34 AM<BR>     Documentation filed: Notes Section, Inpatient Patient Education, Inpatient Document Flowsheet, Charges VN

## 2012-11-05 ENCOUNTER — Encounter: Payer: Self-pay | Admitting: Radiation Oncology

## 2012-11-05 NOTE — Progress Notes (Signed)
11/03/12 Doreatha Martin, RN phoned stating patient was here for consultation and had questions about whether he should re-apply for MCD.  Went to see patient in exam room and presented he and his wife with MCD application as well as [employer] information.  Advised them to go to local DSS office and discuss with caseworker to speed up process.  [place] Sp[place]iel they would proceed with obtaining MCD coverage.

## 2012-11-07 ENCOUNTER — Other Ambulatory Visit: Payer: Self-pay | Admitting: Radiation Oncology

## 2012-11-07 ENCOUNTER — Ambulatory Visit: Payer: Medicare Other | Admitting: Radiation Oncology

## 2012-11-14 ENCOUNTER — Encounter: Payer: Self-pay | Admitting: Radiation Oncology

## 2012-11-14 ENCOUNTER — Ambulatory Visit
Admission: RE | Admit: 2012-11-14 | Discharge: 2012-11-14 | Disposition: A | Discharge status: Home / Self Care | Payer: Medicare Other | Source: Ambulatory Visit | Attending: Radiation Oncology | Admitting: Radiation Oncology

## 2012-11-14 DIAGNOSIS — R079 Chest pain, unspecified: Secondary | ICD-10-CM | POA: Insufficient documentation

## 2012-11-14 DIAGNOSIS — Z510 Encounter for antineoplastic radiation therapy: Secondary | ICD-10-CM | POA: Insufficient documentation

## 2012-11-14 DIAGNOSIS — C3490 Malignant neoplasm of unspecified part of unspecified bronchus or lung: Secondary | ICD-10-CM | POA: Insufficient documentation

## 2012-11-14 NOTE — Progress Notes (Signed)
  Radiation Oncology         [phone] ________________________________  Name: [name] [name] MRN: [id number]  Date: 11/14/2012  DOB: [birth year]  STEREOTACTIC BODY RADIOTHERAPY SIMULATION AND TREATMENT PLANNING NOTE  DIAGNOSIS:  57 year old stage IV adenocarcinoma of the lung with two isolated oligometastatic depsosits in the right lower lung  NARRATIVE:  The patient was brought to the CT Simulation planning suite.  Identity was confirmed.  All relevant records and images related to the planned course of therapy were reviewed.  The patient freely provided informed written consent to proceed with treatment after reviewing the details related to the planned course of therapy. The consent form was witnessed and verified by the simulation staff.  Then, the patient was set-up in a stable reproducible  supine position for radiation therapy.  A BodyFix immobilization pillow was fabricated for reproducible positioning.  Then, the patient was transported to the flouroscopic simulator.  He was trained on how to use the respiratory active breathing control system with goggles to view his respiratory cycle.  Then under flouroscopy, free breathing images were captured and compared to two sets of deep inspiration breath hold images.  It was evident that the patient was effectively managing his breathing, and maintaining the deep inspiration breath hold precisely.  It was also evident that he could reduce his diaphragmatic excursion from 3 cm down to less than 2 mm.  Once this was established, the patient was transported back to the CT suite.  Two sets of CT images were obtained in deep inspiration breath hold.  The diaphragm and tumor positions were verified to be reproduced to within 2 mm on these scans.  Surface markings were placed.  The CT images were loaded into the planning software.  Then, two gross target volumes (GTV) in the right lower lung and composite planning target volume (PTV) were delinieated, and  avoidance structures were contoured.  Treatment planning then occurred.  The radiation prescription was entered and confirmed.  A total of two complex treatment devices were fabricated in the form of the BodyFix immobilization pillow and a neck accuform cushion.  I have requested : 3D Simulation  I have requested a DVH of the following structures: Heart, Lungs, Esophagus, Chest Wall, Brachial Plexus, Major Blood Vessels, and targets.  PLAN:  The patient will receive 54 Gy in 3 fractions.  ________________________________  Artist Pais Kathrynn Running, M.D.

## 2012-11-20 ENCOUNTER — Encounter: Payer: Self-pay | Admitting: Radiation Oncology

## 2012-12-01 ENCOUNTER — Ambulatory Visit: Payer: Medicare Other | Admitting: Radiation Oncology

## 2012-12-03 ENCOUNTER — Ambulatory Visit: Payer: Medicare Other | Admitting: Radiation Oncology

## 2012-12-08 ENCOUNTER — Ambulatory Visit: Payer: Medicare Other | Admitting: Radiation Oncology

## 2012-12-10 ENCOUNTER — Ambulatory Visit: Payer: Medicare Other | Admitting: Radiation Oncology

## 2012-12-10 ENCOUNTER — Ambulatory Visit
Admission: RE | Admit: 2012-12-10 | Discharge: 2012-12-10 | Disposition: A | Discharge status: Home / Self Care | Payer: Medicare Other | Source: Ambulatory Visit | Attending: Radiation Oncology | Admitting: Radiation Oncology

## 2012-12-10 ENCOUNTER — Encounter: Payer: Self-pay | Admitting: Radiation Oncology

## 2012-12-11 ENCOUNTER — Ambulatory Visit
Admission: RE | Admit: 2012-12-11 | Discharge: 2012-12-11 | Disposition: A | Discharge status: Home / Self Care | Payer: Medicare Other | Source: Ambulatory Visit | Attending: Radiation Oncology | Admitting: Radiation Oncology

## 2012-12-11 ENCOUNTER — Encounter: Payer: Self-pay | Admitting: Radiation Oncology

## 2012-12-11 NOTE — Progress Notes (Signed)
  Radiation Oncology         [phone] ________________________________  Name: [name] [name]  MRN: [id number]  Date: 12/11/2012  DOB: [birth year]  Stereotactic Body Radiotherapy Treatment Procedure Note  NARRATIVE:  [name] [name] was brought to the stereotactic radiation treatment machine and placed supine on the CT couch. The patient was set up for stereotactic body radiotherapy on the body fix pillow.  3D TREATMENT PLANNING AND DOSIMETRY:  The patient's radiation plan was reviewed and approved prior to starting treatment.  It showed 3-dimensional radiation distributions overlaid onto the planning CT.  The [facility] for the target structures as well as the organs at risk were reviewed. The documentation of this is filed in the radiation oncology EMR.  SIMULATION VERIFICATION:  The patient underwent CT imaging on the treatment unit.  These were carefully aligned to document that the ablative radiation dose would cover the target volume and maximally spare the nearby organs at risk according to the planned distribution.  SPECIAL TREATMENT PROCEDURE: [name] [name] received high dose ablative stereotactic body radiotherapy to the planned target volume without unforeseen complications. Treatment was delivered uneventfully. The high doses associated with stereotactic body radiotherapy and the significant potential risks require careful treatment set up and patient monitoring constituting a special treatment procedure   STEREOTACTIC TREATMENT MANAGEMENT:  Following delivery, the patient was evaluated clinically. The patient tolerated treatment without significant acute effects, and was discharged to home in stable condition.    PLAN: Continue treatment as planned.  ________________________________  Artist Pais. Kathrynn Running, M.D.

## 2012-12-12 ENCOUNTER — Ambulatory Visit: Payer: Medicare Other | Admitting: Radiation Oncology

## 2012-12-12 ENCOUNTER — Ambulatory Visit: Admission: RE | Admit: 2012-12-12 | Payer: Medicare Other | Source: Ambulatory Visit | Admitting: Radiation Oncology

## 2012-12-16 ENCOUNTER — Encounter: Payer: Self-pay | Admitting: Radiation Oncology

## 2012-12-16 ENCOUNTER — Ambulatory Visit
Admission: RE | Admit: 2012-12-16 | Discharge: 2012-12-16 | Disposition: A | Discharge status: Home / Self Care | Payer: Medicare Other | Source: Ambulatory Visit | Attending: Radiation Oncology | Admitting: Radiation Oncology

## 2012-12-16 NOTE — Progress Notes (Signed)
  Radiation Oncology         [phone] ________________________________  Name: Malikye Reppond MRN: [id number]  Date: 12/16/2012  DOB: 14-Nov-1955  Stereotactic Body Radiotherapy Treatment Procedure Note  NARRATIVE:  Jhamari Markowicz was brought to the stereotactic radiation treatment machine and placed supine on the CT couch. The patient was set up for stereotactic body radiotherapy on the body fix pillow.  3D TREATMENT PLANNING AND DOSIMETRY:  The patient's radiation plan was reviewed and approved prior to starting treatment.  It showed 3-dimensional radiation distributions overlaid onto the planning CT.  The [facility] for the target structures as well as the organs at risk were reviewed. The documentation of this is filed in the radiation oncology EMR.  SIMULATION VERIFICATION:  The patient underwent CT imaging on the treatment unit.  These were carefully aligned to document that the ablative radiation dose would cover the right lung target volume and maximally spare the nearby organs at risk according to the planned distribution.  SPECIAL TREATMENT PROCEDURE: Marchia Bond received high dose ablative stereotactic body radiotherapy to the planned target volume without unforeseen complications. Treatment was delivered uneventfully. Cumulative dose today is  3600 cGy of a prescribed 5400 cGy in 3 sessions. The high doses associated with stereotactic body radiotherapy and the significant potential risks require careful treatment set up and patient monitoring constituting a special treatment procedure   STEREOTACTIC TREATMENT MANAGEMENT:  Following delivery, the patient was evaluated clinically. The patient tolerated treatment without significant acute effects, and was discharged to home in stable condition.    PLAN: Continue treatment as planned.  ------------------------------------------------        Maryln Gottron, MD

## 2012-12-17 ENCOUNTER — Ambulatory Visit: Payer: Medicare Other | Admitting: Radiation Oncology

## 2012-12-18 ENCOUNTER — Encounter: Payer: Self-pay | Admitting: Radiation Oncology

## 2012-12-18 ENCOUNTER — Ambulatory Visit
Admission: RE | Admit: 2012-12-18 | Discharge: 2012-12-18 | Disposition: A | Discharge status: Home / Self Care | Payer: Medicare Other | Source: Ambulatory Visit | Attending: Radiation Oncology | Admitting: Radiation Oncology

## 2012-12-18 NOTE — Progress Notes (Signed)
Patient reports that he feels like someone has punched him in the ribs. Patient requesting a two day supply of hydrocodone for this pain. Reports periodic night sweats. Denies cough, hemoptysis, or shortness of breath. Denies pain or difficulty swallowing. Slow steady gait noted. Patient rambled despite being redirected often. Provided patient with appointment card to follow up in one month with Dr. Kathrynn Running.

## 2012-12-18 NOTE — Progress Notes (Signed)
  Radiation Oncology         [phone] ________________________________  Name: [name] [name] MRN: [id number]  Date: 12/18/2012  DOB: May 09, 1955  Stereotactic Radiation Therapy Management  Current Dose: 54 Gy     Planned Dose:  54 Gy  Narrative . . . . . . . . The patient presents for routine under treatment assessment.                                                      The patient has some right rib pain                                 Set-up films were reviewed.                                 The chart was checked. Physical Findings. . . . Weight essentially stable.  No significant changes. Impression . . . . . . . The patient is tolerating radiation. Plan . . . . . . . . . . . . Continue treatment as planned. Given hydrocodone/apap 5/325 #20 for rib pain.  ________________________________  Artist Pais. Kathrynn Running, M.D.

## 2012-12-22 NOTE — Progress Notes (Signed)
  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       Radiation treatment dates:  12/11/2012, 12/16/2012, 12/18/2012  Site/dose:   The two right lower lung metastases were treated to 54 Gy in 3 fractions.  Beams/energy:   3D conformal radiotherapy was delivered with volumetric arc techniques to deliver 6 MV X-rays in the flattening filter free mode.  [facility] beam CT was used prior to each fraction for positioning.  Breath hold was used to gate radiotherapy.  Narrative: The patient tolerated radiation treatment relatively well.   No acute complications were noted.  Plan: The patient has completed radiation treatment. The patient will return to radiation oncology clinic for routine followup in one month. I advised them to call or return sooner if they have any questions or concerns related to their recovery or treatment. ________________________________  Artist Pais. Kathrynn Running, M.D.

## 2013-01-05 NOTE — Progress Notes (Signed)
  Radiation Oncology         [phone] ________________________________  Name: Nino Amano MRN: [id number]  Date: 11/20/2012  DOB: [birth year]  RESPIRATORY MOTION MANAGEMENT SIMULATION  NARRATIVE:  In order to account for effect of respiratory motion on target structures and other organs in the planning and delivery of radiotherapy, this patient underwent respiratory motion management simulation.  To accomplish this, when the patient was brought to the CT simulation planning suite, flouroscopy and respiratoy motion management CT images were obtained.  Active breathing control was used.  The CT images were loaded into the planning software.  Then, the planning target volumes (PTV) were delineated.  Avoidance structures were contoured.  Treatment planning then occurred.  Dose volume histograms were generated and reviewed for each of the requested structure.  The resulting plan was carefully reviewed and approved today.  ------------------------------------------------  Artist Pais. Kathrynn Running, M.D.

## 2013-01-05 NOTE — Progress Notes (Signed)
  Radiation Oncology         [phone] ________________________________  Name: [name] [name]  MRN: [id number]  Date: 12/18/2012  DOB: [birth year]  Stereotactic Body Radiotherapy Treatment Procedure Note  NARRATIVE:  [name] [name] was brought to the stereotactic radiation treatment machine and placed supine on the CT couch. The patient was set up for stereotactic body radiotherapy on the body fix pillow.  3D TREATMENT PLANNING AND DOSIMETRY:  The patient's radiation plan was reviewed and approved prior to starting treatment.  It showed 3-dimensional radiation distributions overlaid onto the planning CT.  The [facility] for the target structures as well as the organs at risk were reviewed. The documentation of this is filed in the radiation oncology EMR.  SIMULATION VERIFICATION:  The patient underwent CT imaging on the treatment unit.  These were carefully aligned to document that the ablative radiation dose would cover the target volume and maximally spare the nearby organs at risk according to the planned distribution.  SPECIAL TREATMENT PROCEDURE: [name] [name] received high dose ablative stereotactic body radiotherapy to the planned target volume without unforeseen complications. Treatment was delivered uneventfully. The high doses associated with stereotactic body radiotherapy and the significant potential risks require careful treatment set up and patient monitoring constituting a special treatment procedure   STEREOTACTIC TREATMENT MANAGEMENT:  Following delivery, the patient was evaluated clinically. The patient tolerated treatment without significant acute effects, and was discharged to home in stable condition.    PLAN: Continue treatment as planned.  ________________________________  Artist Pais. Kathrynn Running, M.D.

## 2013-02-12 ENCOUNTER — Ambulatory Visit: Payer: Medicare Other | Admitting: Radiation Oncology

## 2013-02-19 ENCOUNTER — Encounter: Payer: Self-pay | Admitting: Radiation Oncology

## 2013-02-19 ENCOUNTER — Ambulatory Visit
Admission: RE | Admit: 2013-02-19 | Discharge: 2013-02-19 | Disposition: A | Discharge status: Home / Self Care | Payer: Medicare Other | Source: Ambulatory Visit | Attending: Radiation Oncology | Admitting: Radiation Oncology

## 2013-02-19 NOTE — Progress Notes (Signed)
Denies cough or difficulty swallowing. Reports shortness of breath only with great exertion. Weight stable. Remains very active putting up decorations. Denies pain. Anxious affect noted. Very talkative. Reports normal color and appearance of treatment field.

## 2013-02-22 ENCOUNTER — Encounter: Payer: Self-pay | Admitting: Radiation Oncology

## 2013-02-22 NOTE — Progress Notes (Signed)
  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  Denies cough or difficulty swallowing. Reports shortness of breath only with great exertion. Weight stable. Remains very active putting up decorations. Denies pain. Anxious affect noted. Very talkative. Reports normal color and appearance of treatment field                            ALLERGIES:  has No Known Allergies.  Meds: Current Outpatient Prescriptions  Medication Sig Dispense Refill  . ALPRAZolam (XANAX) 1 MG tablet Take 1 mg by mouth 3 (three) times daily as needed.       Marland Kitchen amLODipine (NORVASC) 5 MG tablet Take 5 mg by mouth daily.      Marland Kitchen co-enzyme Q-10 50 MG capsule Take 100 mg by mouth daily.      . fluticasone (FLONASE) 50 MCG/ACT nasal spray       . folic acid (FOLVITE) 1 MG tablet Take 1 mg by mouth daily.      Marland Kitchen HYDROcodone-acetaminophen (NORCO/VICODIN) 5-325 MG per tablet       . metoprolol (LOPRESSOR) 100 MG tablet Take 100 mg by mouth daily.      Marland Kitchen oxycodone (OXYCONTIN) 30 MG TB12 Take 30 mg by mouth every 8 (eight) hours.      Marland Kitchen oxyCODONE (ROXICODONE) 15 MG immediate release tablet Take 15 mg by mouth every 6 (six) hours as needed.      . promethazine (PHENERGAN) 25 MG suppository       . sertraline (ZOLOFT) 50 MG tablet Take 50 mg by mouth daily.      . vitamin B-12 (CYANOCOBALAMIN) 1000 MCG tablet Take 1,000 mcg by mouth daily.      . vitamin C (ASCORBIC ACID) 500 MG tablet Take 500 mg by mouth daily.      . vitamin E 100 UNIT capsule Take 100  Units by mouth daily.      Marland Kitchen zolpidem (AMBIEN) 10 MG tablet Take 10 mg by mouth at bedtime as needed.       No current facility-administered medications for this encounter.    Physical Findings: The patient is in no acute distress. Patient is alert and oriented.  weight is 161 lb (73.029 kg). His oral temperature is 97.8 F (36.6 C). His blood pressure is 169/93 and his pulse is 66. His respiration is 18 and oxygen saturation is 100%. .  No significant changes.  Impression:  The patient is recovering from the effects of radiation.  Plan:  He has follow-up chest CT and visit with Dr. Arbutus Ped on 04/13/12 and 04/15/12.  He will return to rad-onc on an as needed basis.  _____________________________________  Artist Pais Kathrynn Running, M.D.

## 2013-04-13 ENCOUNTER — Other Ambulatory Visit: Payer: Medicare Other

## 2013-04-13 ENCOUNTER — Ambulatory Visit (HOSPITAL_COMMUNITY): Admission: RE | Admit: 2013-04-13 | Payer: Medicare Other | Source: Ambulatory Visit

## 2013-04-15 ENCOUNTER — Ambulatory Visit: Payer: Medicare Other | Admitting: Internal Medicine

## 2013-05-19 ENCOUNTER — Other Ambulatory Visit: Payer: Self-pay | Admitting: *Deleted

## 2013-05-21 ENCOUNTER — Telehealth: Payer: Self-pay | Admitting: Internal Medicine

## 2013-05-21 NOTE — Telephone Encounter (Signed)
[facility]monvm for pt re appts for lb/ct  3/25 and MM 3/31. per 3/10 pof appts r/s from feb. schedule mailed

## 2013-06-03 ENCOUNTER — Other Ambulatory Visit (HOSPITAL_BASED_OUTPATIENT_CLINIC_OR_DEPARTMENT_OTHER): Payer: Medicare Other

## 2013-06-03 ENCOUNTER — Ambulatory Visit (HOSPITAL_COMMUNITY)
Admission: RE | Admit: 2013-06-03 | Discharge: 2013-06-03 | Disposition: A | Discharge status: Home / Self Care | Payer: Medicare Other | Source: Ambulatory Visit | Attending: Internal Medicine | Admitting: Internal Medicine

## 2013-06-03 ENCOUNTER — Encounter (HOSPITAL_COMMUNITY): Payer: Self-pay

## 2013-06-03 DIAGNOSIS — K8020 Calculus of gallbladder without cholecystitis without obstruction: Secondary | ICD-10-CM | POA: Insufficient documentation

## 2013-06-03 DIAGNOSIS — N289 Disorder of kidney and ureter, unspecified: Secondary | ICD-10-CM | POA: Insufficient documentation

## 2013-06-03 DIAGNOSIS — J90 Pleural effusion, not elsewhere classified: Secondary | ICD-10-CM | POA: Insufficient documentation

## 2013-06-03 DIAGNOSIS — C3430 Malignant neoplasm of lower lobe, unspecified bronchus or lung: Secondary | ICD-10-CM | POA: Insufficient documentation

## 2013-06-03 DIAGNOSIS — I2510 Atherosclerotic heart disease of native coronary artery without angina pectoris: Secondary | ICD-10-CM | POA: Insufficient documentation

## 2013-06-03 DIAGNOSIS — J9819 Other pulmonary collapse: Secondary | ICD-10-CM | POA: Insufficient documentation

## 2013-06-03 DIAGNOSIS — I2584 Coronary atherosclerosis due to calcified coronary lesion: Secondary | ICD-10-CM | POA: Insufficient documentation

## 2013-06-03 LAB — CBC WITH DIFFERENTIAL/PLATELET
BASO%: 0.7 % (ref 0.0–2.0)
BASOS ABS: 0.1 10*3/uL (ref 0.0–0.1)
EOS%: 5.3 % (ref 0.0–7.0)
Eosinophils Absolute: 0.6 10*3/uL — ABNORMAL HIGH (ref 0.0–0.5)
HEMATOCRIT: 42.5 % (ref 38.4–49.9)
HGB: 14.2 g/dL (ref 13.0–17.1)
LYMPH%: 13.2 % — AB (ref 14.0–49.0)
MCH: 29 pg (ref 27.2–33.4)
MCHC: 33.4 g/dL (ref 32.0–36.0)
MCV: 86.9 fL (ref 79.3–98.0)
MONO#: 1 10*3/uL — ABNORMAL HIGH (ref 0.1–0.9)
MONO%: 8.6 % (ref 0.0–14.0)
NEUT%: 72.2 % (ref 39.0–75.0)
NEUTROS ABS: 8.2 10*3/uL — AB (ref 1.5–6.5)
Platelets: 236 10*3/uL (ref 140–400)
RBC: 4.89 10*6/uL (ref 4.20–5.82)
RDW: 14.7 % — ABNORMAL HIGH (ref 11.0–14.6)
WBC: 11.4 10*3/uL — ABNORMAL HIGH (ref 4.0–10.3)
lymph#: 1.5 10*3/uL (ref 0.9–3.3)

## 2013-06-03 LAB — COMPREHENSIVE METABOLIC PANEL (CC13)
AST: 12 U/L (ref 5–34)
Albumin: 3.4 g/dL — ABNORMAL LOW (ref 3.5–5.0)
Alkaline Phosphatase: 74 U/L (ref 40–150)
Anion Gap: 10 mEq/L (ref 3–11)
BILIRUBIN TOTAL: 0.38 mg/dL (ref 0.20–1.20)
BUN: 19.9 mg/dL (ref 7.0–26.0)
CALCIUM: 9.5 mg/dL (ref 8.4–10.4)
CO2: 24 mEq/L (ref 22–29)
CREATININE: 1.7 mg/dL — AB (ref 0.7–1.3)
Chloride: 110 mEq/L — ABNORMAL HIGH (ref 98–109)
Glucose: 117 mg/dl (ref 70–140)
Potassium: 4.2 mEq/L (ref 3.5–5.1)
Sodium: 144 mEq/L (ref 136–145)
Total Protein: 6.5 g/dL (ref 6.4–8.3)

## 2013-06-03 MED ORDER — IOHEXOL 300 MG/ML  SOLN
100.0000 mL | Freq: Once | INTRAMUSCULAR | Status: AC | PRN
  Administered 2013-06-03: 100 mL via INTRAVENOUS

## 2013-06-09 ENCOUNTER — Telehealth: Payer: Self-pay | Admitting: Internal Medicine

## 2013-06-09 ENCOUNTER — Ambulatory Visit (HOSPITAL_COMMUNITY)
Admission: RE | Admit: 2013-06-09 | Discharge: 2013-06-09 | Disposition: A | Discharge status: Home / Self Care | Payer: Medicare Other | Source: Ambulatory Visit | Attending: Radiology | Admitting: Radiology

## 2013-06-09 ENCOUNTER — Encounter: Payer: Self-pay | Admitting: Internal Medicine

## 2013-06-09 ENCOUNTER — Other Ambulatory Visit: Payer: Self-pay | Admitting: *Deleted

## 2013-06-09 ENCOUNTER — Ambulatory Visit (HOSPITAL_BASED_OUTPATIENT_CLINIC_OR_DEPARTMENT_OTHER): Payer: Medicare Other | Admitting: Internal Medicine

## 2013-06-09 ENCOUNTER — Ambulatory Visit (HOSPITAL_COMMUNITY)
Admission: RE | Admit: 2013-06-09 | Discharge: 2013-06-09 | Disposition: A | Discharge status: Home / Self Care | Payer: Medicare Other | Source: Ambulatory Visit | Attending: Internal Medicine | Admitting: Internal Medicine

## 2013-06-09 VITALS — BP 180/91 | HR 58 | Temp 97.9°F | Resp 20 | Ht 70.0 in | Wt 156.5 lb

## 2013-06-09 VITALS — BP 151/66

## 2013-06-09 DIAGNOSIS — C3490 Malignant neoplasm of unspecified part of unspecified bronchus or lung: Secondary | ICD-10-CM

## 2013-06-09 DIAGNOSIS — C778 Secondary and unspecified malignant neoplasm of lymph nodes of multiple regions: Secondary | ICD-10-CM

## 2013-06-09 DIAGNOSIS — R0989 Other specified symptoms and signs involving the circulatory and respiratory systems: Secondary | ICD-10-CM | POA: Insufficient documentation

## 2013-06-09 DIAGNOSIS — C3430 Malignant neoplasm of lower lobe, unspecified bronchus or lung: Secondary | ICD-10-CM

## 2013-06-09 DIAGNOSIS — J910 Malignant pleural effusion: Secondary | ICD-10-CM | POA: Insufficient documentation

## 2013-06-09 DIAGNOSIS — R0609 Other forms of dyspnea: Secondary | ICD-10-CM | POA: Insufficient documentation

## 2013-06-09 DIAGNOSIS — J90 Pleural effusion, not elsewhere classified: Secondary | ICD-10-CM

## 2013-06-09 NOTE — Progress Notes (Signed)
[facility] [phone]   [phone]  OFFICE PROGRESS NOTE  Woody Seller, MD [address] [place] [address]  PRINCIPAL DIAGNOSIS AND STAGE:  1. Metastatic non-small cell lung cancer, adenocarcinoma diagnosed in June of 2009. 2. Renal insufficiency followed by Dr. Posey Pronto. 3. History of seizure followed by Dr. Doy Mince.  PRIOR THERAPY:  1. Status post 6 cycles of systemic chemotherapy with carboplatin, Alimta, and Avastin. Last dose was given December 25, 2007, and the patient had partial response to this treatment.  2. Status post maintenance chemotherapy with Alimta 500 mg per meter squared and Avastin 15 mg/kg given every 3 weeks, status post 23 cycles. Last dose was given May 23, 2009. The patient had stable disease but the treatment was discontinued secondary to toxicity and renal insufficiency.  CURRENT THERAPY: Observation.  CHEMOTHERAPY INTENT: Palliative  CURRENT # OF CHEMOTHERAPY CYCLES: 0  CURRENT ANTIEMETICS: N/A  CURRENT SMOKING STATUS: Occasional smoker, strongley advised to quit smoking.  ORAL CHEMOTHERAPY AND CONSENT: None  CURRENT BISPHOSPHONATES USE: None  PAIN MANAGEMENT:1/10  NARCOTICS INDUCED CONSTIPATION: N/A  LIVING WILL AND CODE STATUS: Full code.   INTERVAL HISTORY: [name] [name] 58 y.o. male returns to the clinic today for followup visit accompanied by his girlfriend. The patient has been doing fine for the last 6 years but recently start complaining of increasing pain in the right side of the chest with shortness breath with exertion. He denied having any significant cough or hemoptysis. He denied having any weight loss or night sweats. He has no fever or chills, no nausea or vomiting. He had repeat CT scan of the chest, abdomen and pelvis performed without contrast recently and he is here for evaluation and discussion of his scan results.  MEDICAL HISTORY: Past Medical History  Diagnosis Date  .  Hypertension   . lung ca dx'd 05/2007    chemo comp 12/2009  . 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 Hx antineoplastic chemo 2011    ALLERGIES:  has No Known Allergies.  MEDICATIONS:  Current Outpatient Prescriptions  Medication Sig Dispense Refill  . ALPRAZolam (XANAX) 1 MG tablet Take 1 mg by mouth 3 (three) times daily as needed.       Marland Kitchen amLODipine (NORVASC) 5 MG tablet Take 5 mg by mouth daily.      Marland Kitchen co-enzyme Q-10 50 MG capsule Take 100 mg by mouth daily.      . fluticasone (FLONASE) 50 MCG/ACT nasal spray       . folic acid (FOLVITE) 1 MG tablet Take 1 mg by mouth daily.      Marland Kitchen HYDROcodone-acetaminophen (NORCO/VICODIN) 5-325 MG per tablet       . metoprolol (LOPRESSOR) 100 MG tablet Take 100 mg by mouth daily.      Marland Kitchen oxycodone (OXYCONTIN) 30 MG TB12 Take 30 mg by mouth every 8 (eight) hours.      Marland Kitchen oxyCODONE (ROXICODONE) 15 MG immediate release tablet Take 15 mg by mouth every 6 (six) hours as needed.      . promethazine (PHENERGAN) 25 MG suppository       . sertraline (ZOLOFT) 50 MG tablet Take 50 mg by mouth daily.      . vitamin B-12 (CYANOCOBALAMIN) 1000 MCG tablet Take 1,000 mcg by mouth daily.      . vitamin C (ASCORBIC ACID) 500 MG tablet Take 500 mg by mouth daily.      Marland Kitchen  vitamin E 100 UNIT capsule Take 100 Units by mouth daily.      Marland Kitchen zolpidem (AMBIEN) 10 MG tablet Take 10 mg by mouth at bedtime as needed.       No current facility-administered medications for this visit.    REVIEW OF SYSTEMS:  Constitutional: positive for fatigue Eyes: negative Ears, nose, mouth, throat, and face: negative Respiratory: positive for dyspnea on exertion and pleurisy/chest pain Cardiovascular: negative Gastrointestinal: negative Genitourinary:negative Integument/breast: negative Hematologic/lymphatic: negative Musculoskeletal:negative Neurological: negative Behavioral/Psych: negative Endocrine: negative Allergic/Immunologic: negative   PHYSICAL  EXAMINATION: General appearance: alert, cooperative and no distress Head: Normocephalic, without obvious abnormality, atraumatic Neck: no adenopathy Lymph nodes: Cervical, supraclavicular, and axillary nodes normal. Resp: diminished breath sounds RLL and RML and dullness to percussion RLL and RML Back: symmetric, no curvature. ROM normal. No CVA tenderness. Cardio: regular rate and rhythm, S1, S2 normal, no murmur, click, rub or gallop GI: soft, non-tender; bowel sounds normal; no masses,  no organomegaly Extremities: extremities normal, atraumatic, no cyanosis or edema Neurologic: Alert and oriented X 3, normal strength and tone. Normal symmetric reflexes. Normal coordination and gait  ECOG PERFORMANCE STATUS: 1 - Symptomatic but completely ambulatory  Blood pressure 180/91, pulse 58, temperature 97.9 F (36.6 C), temperature source Oral, resp. rate 20, height 5\' 10"  (1.778 m), weight 156 lb 8 oz (70.988 kg), SpO2 98.00%.  LABORATORY DATA: Lab Results  Component Value Date   WBC 11.4* 06/03/2013   HGB 14.2 06/03/2013   HCT 42.5 06/03/2013   MCV 86.9 06/03/2013   PLT 236 06/03/2013      Chemistry      Component Value Date/Time   NA 144 06/03/2013 0947   NA 143 12/21/2011 1429   NA 144 07/18/2011 1546   K 4.2 06/03/2013 0947   K 4.1 12/21/2011 1429   K 4.5 07/18/2011 1546   CL 110* 02/20/2012 1516   CL 109 12/21/2011 1429   CL 101 07/18/2011 1546   CO2 24 06/03/2013 0947   CO2 25 12/21/2011 1429   CO2 28 07/18/2011 1546   BUN 19.9 06/03/2013 0947   BUN 16 12/21/2011 1429   BUN 20 07/18/2011 1546   CREATININE 1.7* 06/03/2013 0947   CREATININE 1.70* 12/21/2011 1429   CREATININE 1.8* 07/18/2011 1546      Component Value Date/Time   CALCIUM 9.5 06/03/2013 0947   CALCIUM 9.2 12/21/2011 1429   CALCIUM 8.8 07/18/2011 1546   ALKPHOS 74 06/03/2013 0947   ALKPHOS 65 12/21/2011 1429   ALKPHOS 83 07/18/2011 1546   AST 12 06/03/2013 0947   AST 12 12/21/2011 1429   AST 19 07/18/2011 1546   ALT <6  06/03/2013 0947   ALT <8 12/21/2011 1429   ALT 12 07/18/2011 1546   BILITOT 0.38 06/03/2013 0947   BILITOT 0.3 12/21/2011 1429   BILITOT 0.50 07/18/2011 1546       RADIOGRAPHIC STUDIES: Dg Chest 1 View  06/09/2013   CLINICAL DATA:  Post right thoracentesis  EXAM: CHEST - 1 VIEW  COMPARISON:  CT chest 06/03/2013  FINDINGS: Improvement in right pleural effusion. Moderate right effusion remains with right lower lobe consolidation. No pneumothorax.  Port-A-Cath tip in the SVC.  Left lung is clear.  IMPRESSION: No pneumothorax post right thoracentesis.   Electronically Signed   By: Franchot Gallo M.D.   On: 06/09/2013 11:12   Ct Chest W Contrast  06/03/2013   CLINICAL DATA:  Lung cancer.  EXAM: CT CHEST, ABDOMEN, AND PELVIS WITH CONTRAST  TECHNIQUE: Multidetector CT imaging of the chest, abdomen and pelvis was performed following the standard protocol during bolus administration of intravenous contrast.  CONTRAST:  139mL OMNIPAQUE IOHEXOL 300 MG/ML  SOLN  COMPARISON:  10/06/2012  FINDINGS:   CT CHEST FINDINGS  Right-sided Port-A-Cath tip projects at the junction of the SVC and RA.  The left subpectoral lymph node which was 14 x 10 mm on the previous study now measures 12 x 8 mm. Stable 6 mm AP window lymph node. No lymphadenopathy in the mediastinum or either hilum on today's study.  Heart size is normal. No pericardial effusion. Coronary artery calcification is noted.  The patient has a moderate to large right pleural effusion with areas of anterior loculation on today's study, substantially progressed in the interval.  Interval progression of right lower lobe collapse with areas of collapse/ consolidation also seen in the right middle lobe. The 2 mm nodule in the right lower lobe on the previous studies obscured by the collapse on today's study. The 1.3 x 4.1 cm irregular shaped pleural-based nodule in the periphery of the right lower lobe seen on the previous study is also obscured by the collapsed lung on  today's exam.  11 mm subpleural nodule in the left upper lobe on image 23 is new in the interval. This has sub solid features and may represent area of infectious or inflammatory alveolitis. 2 mm nodule in the lateral left lower lobe on image 41 is unchanged. 2 mm nodule in the posterior left lower lobe on image 41 appears slightly increased in size in the interval. 2- 3 mm nodule in the left lower lobe on image 37 has progressed in the interval. 1-2 mm nodule in the posterior left lower lobe on image 35 appears new in the interval.  Bone windows reveal no worrisome lytic or sclerotic osseous lesions.    CT ABDOMEN AND PELVIS FINDINGS  Small area focal fatty infiltration identified in the anterior left liver. Liver is otherwise unremarkable. No focal abnormality is seen in the spleen. The stomach is decompressed. The duodenum, pancreas, and adrenal glands are unremarkable. Layering tiny gallstones are seen in the gallbladder.  Tiny low-density lesion in the upper pole of the right kidney is likely a cyst. The left kidney is unremarkable.  Small retroperitoneal and hepatoduodenal ligament lymph nodes are not substantially changed in the interval.  Imaging through the pelvis shows no free intraperitoneal fluid. There is no pelvic sidewall lymphadenopathy. Bladder is unremarkable. Prostate gland is enlarged.  No evidence for colonic diverticulitis. The terminal ileum is normal. The appendix is not visualized, but there is no edema or inflammation in the region of the cecum.  Bone windows reveal no worrisome lytic or sclerotic osseous lesions.    IMPRESSION: Marked interval progression of right pleural effusion. Right lower lobe and middle lobe collapse obscures the right-sided lung lesions seen on the previous study.  Tiny left lower lobe pulmonary nodules appear slightly progressed in the interval and a new tiny left lower lobe pulmonary nodule is evident. Imaging features are concerning for metastatic disease and  close attention on followup imaging is recommended.  Cholelithiasis.  No evidence for metastatic disease in the abdomen or pelvis.   Electronically Signed   By: Misty Stanley M.D.   On: 06/03/2013 15:03   ASSESSMENT AND PLAN: This is a very pleasant 58 years old white male with history of metastatic non-small cell lung cancer, adenocarcinoma status post systemic chemotherapy with carboplatin, Alimta and Avastin followed by  maintenance Alimta and Avastin but has been observation since March of 2011, presented recently by stereotactic radiotherapy to enlarging right lung nodules under the care of Dr. Tammi Klippel. His recent scan showed market interval progression of the right pleural effusion with slight progression of the left lower lobe pulmonary nodules. I discussed the scan results and showed the images to the patient and his girlfriend. I recommended for him to proceed with ultrasound guided right diagnostic and therapeutic thoracentesis.  I would see him back for followup visit in 2 weeks for reevaluation and discussion of his treatment options based on the final cytology of the pleural fluid. I may consider the patient for systemic chemotherapy if the cytology is positive.  The patient voices understanding of current disease status and treatment options and is in agreement with the current care plan.  All questions were answered. The patient knows to call the clinic with any problems, questions or concerns. We can certainly see the patient much sooner if necessary.  I spent 15 minutes counseling the patient face to face. The total time spent in the appointment was 25 minutes.   Disclaimer: This note was dictated with voice recognition software. Similar sounding words can inadvertently be transcribed and may not be corrected upon review.

## 2013-06-09 NOTE — Procedures (Signed)
US guided diagnostic/therapeutic right thoracentesis performed yielding 1.8 liters blood-tinged fluid. A portion of the fluid was sent to the lab for cytology. F/u CXR pending. No immediate complications.

## 2013-06-09 NOTE — Patient Instructions (Signed)
Smoking Cessation, Tips for Success If you are ready to quit smoking, congratulations! You have chosen to help yourself be healthier. Cigarettes bring nicotine, tar, carbon monoxide, and other irritants into your body. Your lungs, heart, and blood vessels will be able to work better without these poisons. There are many different ways to quit smoking. Nicotine gum, nicotine patches, a nicotine inhaler, or nicotine nasal spray can help with physical craving. Hypnosis, support groups, and medicines help break the habit of smoking. WHAT THINGS CAN I DO TO MAKE QUITTING EASIER?  Here are some tips to help you quit for good:  Pick a date when you will quit smoking completely. Tell all of your friends and family about your plan to quit on that date.  Do not try to slowly cut down on the number of cigarettes you are smoking. Pick a quit date and quit smoking completely starting on that day.  Throw away all cigarettes.   Clean and remove all ashtrays from your home, work, and car.   On a card, write down your reasons for quitting. Carry the card with you and read it when you get the urge to smoke.   Cleanse your body of nicotine. Drink enough water and fluids to keep your urine clear or pale yellow. Do this after quitting to flush the nicotine from your body.   Learn to predict your moods. Do not let a bad situation be your excuse to have a cigarette. Some situations in your life might tempt you into wanting a cigarette.   Never have "just one" cigarette. It leads to wanting another and another. Remind yourself of your decision to quit.   Change habits associated with smoking. If you smoked while driving or when feeling stressed, try other activities to replace smoking. Stand up when drinking your coffee. Brush your teeth after eating. Sit in a different chair when you read the paper. Avoid alcohol while trying to quit, and try to drink fewer caffeinated beverages. Alcohol and caffeine may urge  you to smoke.   Avoid foods and drinks that can trigger a desire to smoke, such as sugary or spicy foods and alcohol.   Ask people who smoke not to smoke around you.   Have something planned to do right after eating or having a cup of coffee. For example, plan to take a walk or exercise.   Try a relaxation exercise to calm you down and decrease your stress. Remember, you may be tense and nervous for the first 2 weeks after you quit, but this will pass.   Find new activities to keep your hands busy. Play with a pen, coin, or rubber band. Doodle or draw things on paper.   Brush your teeth right after eating. This will help cut down on the craving for the taste of tobacco after meals. You can also try mouthwash.   Use oral substitutes in place of cigarettes. Try using lemon drops, carrots, cinnamon sticks, or chewing gum. Keep them handy so they are available when you have the urge to smoke.   When you have the urge to smoke, try deep breathing.   Designate your home as a nonsmoking area.   If you are a heavy smoker, ask your health care provider about a prescription for nicotine chewing gum. It can ease your withdrawal from nicotine.   Reward yourself. Set aside the cigarette money you save and buy yourself something nice.   Look for support from others. Join a support group or   smoking cessation program. Ask someone at home or at work to help you with your plan to quit smoking.   Always ask yourself, "Do I need this cigarette or is this just a reflex?" Tell yourself, "Today, I choose not to smoke," or "I do not want to smoke." You are reminding yourself of your decision to quit.  Do not replace cigarette smoking with electronic cigarettes (commonly called e-cigarettes). The safety of e-cigarettes is unknown, and some may contain harmful chemicals.  If you relapse, do not give up! Plan ahead and think about what you will do the next time you get the urge to smoke.  HOW WILL  I FEEL WHEN I QUIT SMOKING? You may have symptoms of withdrawal because your body is used to nicotine (the addictive substance in cigarettes). You may crave cigarettes, be irritable, feel very hungry, cough often, get headaches, or have difficulty concentrating. The withdrawal symptoms are only temporary. They are strongest when you first quit but will go away within 10 14 days. When withdrawal symptoms occur, stay in control. Think about your reasons for quitting. Remind yourself that these are signs that your body is healing and getting used to being without cigarettes. Remember that withdrawal symptoms are easier to treat than the major diseases that smoking can cause.  Even after the withdrawal is over, expect periodic urges to smoke. However, these cravings are generally short lived and will go away whether you smoke or not. Do not smoke!  WHAT RESOURCES ARE AVAILABLE TO HELP ME QUIT SMOKING? Your health care provider can direct you to community resources or hospitals for support, which may include:  Group support.  Education.  Hypnosis.  Therapy. Document Released: 11/25/2003 Document Revised: 12/17/2012 Document Reviewed: 08/14/2012 Exit[employer] Patient Information 2014 [employer].  

## 2013-06-09 NOTE — Telephone Encounter (Signed)
gv adn printed aptp sched adn avs for pt for April....sent pt to radiology

## 2013-06-17 ENCOUNTER — Telehealth: Payer: Self-pay | Admitting: Internal Medicine

## 2013-06-17 NOTE — Telephone Encounter (Signed)
FAXED PT [place]

## 2013-06-22 ENCOUNTER — Other Ambulatory Visit: Payer: Self-pay | Admitting: *Deleted

## 2013-06-24 ENCOUNTER — Other Ambulatory Visit: Payer: Self-pay | Admitting: *Deleted

## 2013-06-24 ENCOUNTER — Telehealth: Payer: Self-pay | Admitting: *Deleted

## 2013-06-24 DIAGNOSIS — C3490 Malignant neoplasm of unspecified part of unspecified bronchus or lung: Secondary | ICD-10-CM

## 2013-06-24 NOTE — Telephone Encounter (Signed)
Left vm message regarding appt with Dr. Roxan Hockey.  I gave date, time and address.

## 2013-06-26 ENCOUNTER — Encounter: Payer: Medicare Other | Admitting: Thoracic Surgery (Cardiothoracic Vascular Surgery)

## 2013-06-29 ENCOUNTER — Telehealth: Payer: Self-pay | Admitting: *Deleted

## 2013-06-29 ENCOUNTER — Encounter: Payer: Medicare Other | Admitting: Thoracic Surgery (Cardiothoracic Vascular Surgery)

## 2013-06-29 NOTE — Telephone Encounter (Signed)
Called left vm message to call me regarding appt with Dr. Roxan Hockey.  I left my name and phone number

## 2013-06-30 ENCOUNTER — Encounter: Payer: Self-pay | Admitting: Internal Medicine

## 2013-06-30 ENCOUNTER — Ambulatory Visit (HOSPITAL_BASED_OUTPATIENT_CLINIC_OR_DEPARTMENT_OTHER): Payer: Medicare Other | Admitting: Internal Medicine

## 2013-06-30 ENCOUNTER — Other Ambulatory Visit: Payer: Self-pay | Admitting: *Deleted

## 2013-06-30 ENCOUNTER — Telehealth: Payer: Self-pay | Admitting: Internal Medicine

## 2013-06-30 VITALS — BP 166/97 | HR 74 | Temp 98.6°F | Resp 20 | Ht 70.0 in | Wt 153.8 lb

## 2013-06-30 DIAGNOSIS — J90 Pleural effusion, not elsewhere classified: Secondary | ICD-10-CM

## 2013-06-30 DIAGNOSIS — C3430 Malignant neoplasm of lower lobe, unspecified bronchus or lung: Secondary | ICD-10-CM

## 2013-06-30 HISTORY — DX: Pleural effusion, not elsewhere classified: J90

## 2013-06-30 MED ORDER — ERLOTINIB HCL 150 MG PO TABS: 150.0000 mg | ORAL_TABLET | Freq: Every day | ORAL | Status: DC

## 2013-06-30 NOTE — Patient Instructions (Signed)
Smoking Cessation Quitting smoking is important to your health and has many advantages. However, it is not always easy to quit since nicotine is a very addictive drug. Often times, people try 3 times or more before being able to quit. This document explains the best ways for you to prepare to quit smoking. Quitting takes hard work and a lot of effort, but you can do it. ADVANTAGES OF QUITTING SMOKING  You will live longer, feel better, and live better.  Your body will feel the impact of quitting smoking almost immediately.  Within 20 minutes, blood pressure decreases. Your pulse returns to its normal level.  After 8 hours, carbon monoxide levels in the blood return to normal. Your oxygen level increases.  After 24 hours, the chance of having a heart attack starts to decrease. Your breath, hair, and body stop smelling like smoke.  After 48 hours, damaged nerve endings begin to recover. Your sense of taste and smell improve.  After 72 hours, the body is virtually free of nicotine. Your bronchial tubes relax and breathing becomes easier.  After 2 to 12 weeks, lungs can hold more air. Exercise becomes easier and circulation improves.  The risk of having a heart attack, stroke, cancer, or lung disease is greatly reduced.  After 1 year, the risk of coronary heart disease is cut in half.  After 5 years, the risk of stroke falls to the same as a nonsmoker.  After 10 years, the risk of lung cancer is cut in half and the risk of other cancers decreases significantly.  After 15 years, the risk of coronary heart disease drops, usually to the level of a nonsmoker.  If you are pregnant, quitting smoking will improve your chances of having a healthy baby.  The people you live with, especially any children, will be healthier.  You will have extra money to spend on things other than cigarettes. QUESTIONS TO THINK ABOUT BEFORE ATTEMPTING TO QUIT You may want to talk about your answers with your  caregiver.  Why do you want to quit?  If you tried to quit in the past, what helped and what did not?  What will be the most difficult situations for you after you quit? How will you plan to handle them?  Who can help you through the tough times? Your family? Friends? A caregiver?  What pleasures do you get from smoking? What ways can you still get pleasure if you quit? Here are some questions to ask your caregiver:  How can you help me to be successful at quitting?  What medicine do you think would be best for me and how should I take it?  What should I do if I need more help?  What is smoking withdrawal like? How can I get information on withdrawal? GET READY  Set a quit date.  Change your environment by getting rid of all cigarettes, ashtrays, matches, and lighters in your home, car, or work. Do not let people smoke in your home.  Review your past attempts to quit. Think about what worked and what did not. GET SUPPORT AND ENCOURAGEMENT You have a better chance of being successful if you have help. You can get support in many ways.  Tell your family, friends, and co-workers that you are going to quit and need their support. Ask them not to smoke around you.  Get individual, group, or telephone counseling and support. Programs are available at local hospitals and health centers. Call your local health department for   information about programs in your area.  Spiritual beliefs and practices may help some smokers quit.  Download a "quit meter" on your computer to keep track of quit statistics, such as how long you have gone without smoking, cigarettes not smoked, and money saved.  Get a self-help book about quitting smoking and staying off of tobacco. LEARN NEW SKILLS AND BEHAVIORS  Distract yourself from urges to smoke. Talk to someone, go for a walk, or occupy your time with a task.  Change your normal routine. Take a different route to work. Drink tea instead of coffee.  Eat breakfast in a different place.  Reduce your stress. Take a hot bath, exercise, or read a book.  Plan something enjoyable to do every day. Reward yourself for not smoking.  Explore interactive web-based programs that specialize in helping you quit. GET MEDICINE AND USE IT CORRECTLY Medicines can help you stop smoking and decrease the urge to smoke. Combining medicine with the above behavioral methods and support can greatly increase your chances of successfully quitting smoking.  Nicotine replacement therapy helps deliver nicotine to your body without the negative effects and risks of smoking. Nicotine replacement therapy includes nicotine gum, lozenges, inhalers, nasal sprays, and skin patches. Some may be available over-the-counter and others require a prescription.  Antidepressant medicine helps people abstain from smoking, but how this works is unknown. This medicine is available by prescription.  Nicotinic receptor partial agonist medicine simulates the effect of nicotine in your brain. This medicine is available by prescription. Ask your caregiver for advice about which medicines to use and how to use them based on your health history. Your caregiver will tell you what side effects to look out for if you choose to be on a medicine or therapy. Carefully read the information on the package. Do not use any other product containing nicotine while using a nicotine replacement product.  RELAPSE OR DIFFICULT SITUATIONS Most relapses occur within the first 3 months after quitting. Do not be discouraged if you start smoking again. Remember, most people try several times before finally quitting. You may have symptoms of withdrawal because your body is used to nicotine. You may crave cigarettes, be irritable, feel very hungry, cough often, get headaches, or have difficulty concentrating. The withdrawal symptoms are only temporary. They are strongest when you first quit, but they will go away within  10 14 days. To reduce the chances of relapse, try to:  Avoid drinking alcohol. Drinking lowers your chances of successfully quitting.  Reduce the amount of caffeine you consume. Once you quit smoking, the amount of caffeine in your body increases and can give you symptoms, such as a rapid heartbeat, sweating, and anxiety.  Avoid smokers because they can make you want to smoke.  Do not let weight gain distract you. Many smokers will gain weight when they quit, usually less than 10 pounds. Eat a healthy diet and stay active. You can always lose the weight gained after you quit.  Find ways to improve your mood other than smoking. FOR MORE INFORMATION  www.smokefree.gov  Document Released: 02/20/2001 Document Revised: 08/28/2011 Document Reviewed: 06/07/2011 [employer] Patient Information 2014 [employer].  

## 2013-06-30 NOTE — Progress Notes (Signed)
[place] [phone]   [phone]  OFFICE PROGRESS NOTE  Woody Seller, MD [address] [place] [address]  PRINCIPAL DIAGNOSIS AND STAGE:  1. Metastatic non-small cell lung cancer, adenocarcinoma diagnosed in June of 2009. 2. Renal insufficiency followed by Dr. Posey Pronto. 3. History of seizure followed by Dr. Doy Mince.  PRIOR THERAPY:  1. Status post 6 cycles of systemic chemotherapy with carboplatin, Alimta, and Avastin. Last dose was given December 25, 2007, and the patient had partial response to this treatment.  2. Status post maintenance chemotherapy with Alimta 500 mg per meter squared and Avastin 15 mg/kg given every 3 weeks, status post 23 cycles. Last dose was given May 23, 2009. The patient had stable disease but the treatment was discontinued secondary to toxicity and renal insufficiency.  CURRENT THERAPY: Tarceva 150 mg by mouth daily. Expected to start in the next few days  CHEMOTHERAPY INTENT: Palliative  CURRENT # OF CHEMOTHERAPY CYCLES: 0  CURRENT ANTIEMETICS: N/A  CURRENT SMOKING STATUS: Occasional smoker, strongley advised to quit smoking.  ORAL CHEMOTHERAPY AND CONSENT: None  CURRENT BISPHOSPHONATES USE: None  PAIN MANAGEMENT:1/10  NARCOTICS INDUCED CONSTIPATION: N/A  LIVING WILL AND CODE STATUS: Full code.   INTERVAL HISTORY: [name] [name] 58 y.o. male returns to the clinic today for followup visit accompanied by his girlfriend. The patient is feeling fine today except for right-sided chest pain and shortness of breath with exertion. He recently underwent a right-sided thoracentesis with drainage of 1.8 L of pleural fluids and the final cytology was consistent with metastatic adenocarcinoma. He was referred to Dr. Roxan Hockey for consideration of Pleurx catheter placement but the patient missed 2 appointments with Dr. Roxan Hockey. He denied having any significant cough or hemoptysis. The patient denied  having any fever or chills. He denied having any significant nausea or vomiting. He has no weight loss or night sweats. He is here today for evaluation and discussion of his treatment options.  MEDICAL HISTORY: Past Medical History  Diagnosis Date  . Hypertension   . lung ca dx'd 05/2007    chemo comp 12/2009  . 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 Hx antineoplastic chemo 2011    ALLERGIES:  has No Known Allergies.  MEDICATIONS:  Current Outpatient Prescriptions  Medication Sig Dispense Refill  . ALPRAZolam (XANAX) 1 MG tablet Take 1 mg by mouth 3 (three) times daily as needed.       Marland Kitchen amLODipine (NORVASC) 5 MG tablet Take 5 mg by mouth daily.      Marland Kitchen co-enzyme Q-10 50 MG capsule Take 100 mg by mouth daily.      . fluticasone (FLONASE) 50 MCG/ACT nasal spray       . folic acid (FOLVITE) 1 MG tablet Take 1 mg by mouth daily.      Marland Kitchen HYDROcodone-acetaminophen (NORCO/VICODIN) 5-325 MG per tablet       . metoprolol (LOPRESSOR) 100 MG tablet Take 100 mg by mouth daily.      Marland Kitchen oxycodone (OXYCONTIN) 30 MG TB12 Take 30 mg by mouth every 8 (eight) hours.      Marland Kitchen oxyCODONE (ROXICODONE) 15 MG immediate release tablet Take 15 mg by mouth every 6 (six) hours as needed.      . promethazine (PHENERGAN) 25 MG suppository       . sertraline (ZOLOFT) 50 MG tablet Take 50 mg by mouth daily.      Marland Kitchen  vitamin B-12 (CYANOCOBALAMIN) 1000 MCG tablet Take 1,000 mcg by mouth daily.      . vitamin C (ASCORBIC ACID) 500 MG tablet Take 500 mg by mouth daily.      . vitamin E 100 UNIT capsule Take 100 Units by mouth daily.      Marland Kitchen zolpidem (AMBIEN) 10 MG tablet Take 10 mg by mouth at bedtime as needed.       No current facility-administered medications for this visit.    REVIEW OF SYSTEMS:  Constitutional: positive for fatigue Eyes: negative Ears, nose, mouth, throat, and face: negative Respiratory: positive for dyspnea on exertion and pleurisy/chest pain Cardiovascular:  negative Gastrointestinal: negative Genitourinary:negative Integument/breast: negative Hematologic/lymphatic: negative Musculoskeletal:negative Neurological: negative Behavioral/Psych: negative Endocrine: negative Allergic/Immunologic: negative   PHYSICAL EXAMINATION: General appearance: alert, cooperative and no distress Head: Normocephalic, without obvious abnormality, atraumatic Neck: no adenopathy Lymph nodes: Cervical, supraclavicular, and axillary nodes normal. Resp: diminished breath sounds RLL and RML and dullness to percussion RLL and RML Back: symmetric, no curvature. ROM normal. No CVA tenderness. Cardio: regular rate and rhythm, S1, S2 normal, no murmur, click, rub or gallop GI: soft, non-tender; bowel sounds normal; no masses,  no organomegaly Extremities: extremities normal, atraumatic, no cyanosis or edema Neurologic: Alert and oriented X 3, normal strength and tone. Normal symmetric reflexes. Normal coordination and gait  ECOG PERFORMANCE STATUS: 1 - Symptomatic but completely ambulatory  Blood pressure 166/97, pulse 74, temperature 98.6 F (37 C), temperature source Oral, resp. rate 20, height _0  (1.778 m), weight 153 lb 12.8 oz (69.763 kg).  LABORATORY DATA: Lab Results  Component Value Date   WBC 11.4* 06/03/2013   HGB 14.2 06/03/2013   HCT 42.5 06/03/2013   MCV 86.9 06/03/2013   PLT 236 06/03/2013      Chemistry      Component Value Date/Time   NA 144 06/03/2013 0947   NA 143 12/21/2011 1429   NA 144 07/18/2011 1546   K 4.2 06/03/2013 0947   K 4.1 12/21/2011 1429   K 4.5 07/18/2011 1546   CL 110* 02/20/2012 1516   CL 109 12/21/2011 1429   CL 101 07/18/2011 1546   CO2 24 06/03/2013 0947   CO2 25 12/21/2011 1429   CO2 28 07/18/2011 1546   BUN 19.9 06/03/2013 0947   BUN 16 12/21/2011 1429   BUN 20 07/18/2011 1546   CREATININE 1.7* 06/03/2013 0947   CREATININE 1.70* 12/21/2011 1429   CREATININE 1.8* 07/18/2011 1546      Component Value Date/Time   CALCIUM 9.5  06/03/2013 0947   CALCIUM 9.2 12/21/2011 1429   CALCIUM 8.8 07/18/2011 1546   ALKPHOS 74 06/03/2013 0947   ALKPHOS 65 12/21/2011 1429   ALKPHOS 83 07/18/2011 1546   AST 12 06/03/2013 0947   AST 12 12/21/2011 1429   AST 19 07/18/2011 1546   ALT <6 06/03/2013 0947   ALT <8 12/21/2011 1429   ALT 12 07/18/2011 1546   BILITOT 0.38 06/03/2013 0947   BILITOT 0.3 12/21/2011 1429   BILITOT 0.50 07/18/2011 1546       RADIOGRAPHIC STUDIES: Dg Chest 1 View  06/09/2013   CLINICAL DATA:  Post right thoracentesis  EXAM: CHEST - 1 VIEW  COMPARISON:  CT chest 06/03/2013  FINDINGS: Improvement in right pleural effusion. Moderate right effusion remains with right lower lobe consolidation. No pneumothorax.  Port-A-Cath tip in the SVC.  Left lung is clear.  IMPRESSION: No pneumothorax post right thoracentesis.   Electronically Signed  By: Franchot Gallo M.D.   On: 06/09/2013 11:12   Ct Chest W Contrast  06/03/2013   CLINICAL DATA:  Lung cancer.  EXAM: CT CHEST, ABDOMEN, AND PELVIS WITH CONTRAST  TECHNIQUE: Multidetector CT imaging of the chest, abdomen and pelvis was performed following the standard protocol during bolus administration of intravenous contrast.  CONTRAST:  159m OMNIPAQUE IOHEXOL 300 MG/ML  SOLN  COMPARISON:  10/06/2012  FINDINGS: CT CHEST FINDINGS  Right-sided Port-A-Cath tip projects at the junction of the SVC and RA.  The left subpectoral lymph node which was 14 x 10 mm on the previous study now measures 12 x 8 mm. Stable 6 mm AP window lymph node. No lymphadenopathy in the mediastinum or either hilum on today's study.  Heart size is normal. No pericardial effusion. Coronary artery calcification is noted.  The patient has a moderate to large right pleural effusion with areas of anterior loculation on today's study, substantially progressed in the interval.  Interval progression of right lower lobe collapse with areas of collapse/ consolidation also seen in the right middle lobe. The 2 mm nodule in the right  lower lobe on the previous studies obscured by the collapse on today's study. The 1.3 x 4.1 cm irregular shaped pleural-based nodule in the periphery of the right lower lobe seen on the previous study is also obscured by the collapsed lung on today's exam.  11 mm subpleural nodule in the left upper lobe on image 23 is new in the interval. This has sub solid features and may represent area of infectious or inflammatory alveolitis. 2 mm nodule in the lateral left lower lobe on image 41 is unchanged. 2 mm nodule in the posterior left lower lobe on image 41 appears slightly increased in size in the interval. 2- 3 mm nodule in the left lower lobe on image 37 has progressed in the interval. 1-2 mm nodule in the posterior left lower lobe on image 35 appears new in the interval.  Bone windows reveal no worrisome lytic or sclerotic osseous lesions.  CT ABDOMEN AND PELVIS FINDINGS  Small area focal fatty infiltration identified in the anterior left liver. Liver is otherwise unremarkable. No focal abnormality is seen in the spleen. The stomach is decompressed. The duodenum, pancreas, and adrenal glands are unremarkable. Layering tiny gallstones are seen in the gallbladder.  Tiny low-density lesion in the upper pole of the right kidney is likely a cyst. The left kidney is unremarkable.  Small retroperitoneal and hepatoduodenal ligament lymph nodes are not substantially changed in the interval.  Imaging through the pelvis shows no free intraperitoneal fluid. There is no pelvic sidewall lymphadenopathy. Bladder is unremarkable. Prostate gland is enlarged.  No evidence for colonic diverticulitis. The terminal ileum is normal. The appendix is not visualized, but there is no edema or inflammation in the region of the cecum.  Bone windows reveal no worrisome lytic or sclerotic osseous lesions.  IMPRESSION: Marked interval progression of right pleural effusion. Right lower lobe and middle lobe collapse obscures the right-sided lung  lesions seen on the previous study.  Tiny left lower lobe pulmonary nodules appear slightly progressed in the interval and a new tiny left lower lobe pulmonary nodule is evident. Imaging features are concerning for metastatic disease and close attention on followup imaging is recommended.  Cholelithiasis.  No evidence for metastatic disease in the abdomen or pelvis.   Electronically Signed   By: EMisty StanleyM.D.   On: 06/03/2013 15:03     [place]oracentesis  Asp Pleural Space W/img Guide  06/09/2013   CLINICAL DATA:  Patient with history of metastatic non-small cell lung carcinoma, dyspnea, recurrent right pleural effusion. Request is made for diagnostic and therapeutic right thoracentesis.  EXAM: ULTRASOUND GUIDED DIAGNOSTIC AND THERAPEUTIC RIGHT THORACENTESIS  COMPARISON:  None.  PROCEDURE: An ultrasound guided thoracentesis was thoroughly discussed with the patient and questions answered. The benefits, risks, alternatives and complications were also discussed. The patient understands and wishes to proceed with the procedure. Written consent was obtained.  Ultrasound was performed to localize and mark an adequate pocket of fluid in the right chest. The area was then prepped and draped in the normal sterile fashion. 1% Lidocaine was used for local anesthesia. Under ultrasound guidance a 19 gauge Yueh catheter was introduced. Thoracentesis was performed. The catheter was removed and a dressing applied.  Complications:  none  FINDINGS: A total of approximately 1.8 liters of blood-tinged fluid was removed. A fluid sample wassent for laboratory analysis. Secondary to chest discomfort only the above amount of fluid was removed at this time.  IMPRESSION: Successful ultrasound guided diagnostic and therapeutic right thoracentesis yielding 1.8 liters of pleural fluid.  Read by: Rowe Robert ,P.A.-C.   Electronically Signed   By: Aletta Edouard M.D.   On: 06/09/2013 13:34   ASSESSMENT AND PLAN: This is a very  pleasant 58 years old white male with history of metastatic non-small cell lung cancer, adenocarcinoma status post systemic chemotherapy with carboplatin, Alimta and Avastin followed by maintenance Alimta and Avastin but has been observation since March of 2011, presented recently by stereotactic radiotherapy to enlarging right lung nodules under the care of Dr. Tammi Klippel. His recent scan showed market interval progression of the right pleural effusion with slight progression of the left lower lobe pulmonary nodules. Recently underwent right thoracentesis with drainage of 1.8 L of pleural fluid that was consistent with metastatic adenocarcinoma. I have a lengthy discussion with the patient and his girlfriend today about his current disease status and treatment options. I recommended for the patient to have for Pleurx catheter placed for drainage of the pleural fluid by interventional radiology as Dr. Roxan Hockey declined to see the patient in after he missed 2 appointments. I also discussed with the patient his treatment options including systemic chemotherapy with docetaxel plus Cyramza versus oral Tarceva 150 mg by mouth daily.  I discussed with the patient adverse effect of both options.  He is interested on treatment with oral Tarceva and I reminded him of the adverse effect of this treatment including but not limited to skin rash, diarrhea, interstitial lung disease, liver or renal dysfunction as well as dry skin. He was also given a handout and a starter kit for Tarceva. I will send his prescription to a stronger outpatient pharmacy for refill.  I expect the patient to start his treatment in the next few days if there is no issues with insurance coverage. He would come back for followup visit in 2 weeks for reevaluation and management any adverse effect of his treatment. He was advised to call immediately if he has any concerning symptoms in the interval. The patient voices understanding of current  disease status and treatment options and is in agreement with the current care plan.  All questions were answered. The patient knows to call the clinic with any problems, questions or concerns. We can certainly see the patient much sooner if necessary.  Disclaimer: This note was dictated with voice recognition software. Similar sounding words can inadvertently be transcribed  and may not be corrected upon review.

## 2013-06-30 NOTE — Telephone Encounter (Signed)
gv adn printed appt sched and avs for pt for May.Silvana Newness MD to add order for IR

## 2013-07-01 ENCOUNTER — Other Ambulatory Visit: Payer: Self-pay | Admitting: Radiology

## 2013-07-01 ENCOUNTER — Encounter: Payer: Self-pay | Admitting: Internal Medicine

## 2013-07-01 ENCOUNTER — Telehealth: Payer: Self-pay | Admitting: Internal Medicine

## 2013-07-01 NOTE — Telephone Encounter (Signed)
s.w. pt wife and advised on IR port placement arrive @ 10:30am on 4.24.15...Marland Kitchenok and aware

## 2013-07-01 NOTE — Progress Notes (Signed)
[facility], [phone], faxed pa form for tarceva; should have response within 24-72 hours

## 2013-07-02 ENCOUNTER — Encounter (HOSPITAL_COMMUNITY): Payer: Self-pay | Admitting: Pharmacy Technician

## 2013-07-02 ENCOUNTER — Encounter: Payer: Self-pay | Admitting: Internal Medicine

## 2013-07-02 NOTE — Progress Notes (Signed)
Tarceva is approved from 04/03/13-06/30/23 [id number]

## 2013-07-03 ENCOUNTER — Ambulatory Visit (HOSPITAL_COMMUNITY)
Admission: RE | Admit: 2013-07-03 | Discharge: 2013-07-03 | Disposition: A | Discharge status: Home / Self Care | Payer: Medicare Other | Source: Ambulatory Visit | Attending: Internal Medicine | Admitting: Internal Medicine

## 2013-07-03 ENCOUNTER — Encounter (HOSPITAL_COMMUNITY): Payer: Self-pay

## 2013-07-03 ENCOUNTER — Encounter: Payer: Self-pay | Admitting: Internal Medicine

## 2013-07-03 ENCOUNTER — Other Ambulatory Visit: Payer: Self-pay | Admitting: Internal Medicine

## 2013-07-03 ENCOUNTER — Telehealth: Payer: Self-pay | Admitting: *Deleted

## 2013-07-03 DIAGNOSIS — C3490 Malignant neoplasm of unspecified part of unspecified bronchus or lung: Secondary | ICD-10-CM | POA: Insufficient documentation

## 2013-07-03 DIAGNOSIS — F17200 Nicotine dependence, unspecified, uncomplicated: Secondary | ICD-10-CM | POA: Insufficient documentation

## 2013-07-03 DIAGNOSIS — J90 Pleural effusion, not elsewhere classified: Secondary | ICD-10-CM

## 2013-07-03 DIAGNOSIS — J910 Malignant pleural effusion: Secondary | ICD-10-CM | POA: Insufficient documentation

## 2013-07-03 DIAGNOSIS — I10 Essential (primary) hypertension: Secondary | ICD-10-CM | POA: Insufficient documentation

## 2013-07-03 DIAGNOSIS — Z79899 Other long term (current) drug therapy: Secondary | ICD-10-CM | POA: Insufficient documentation

## 2013-07-03 DIAGNOSIS — Z9221 Personal history of antineoplastic chemotherapy: Secondary | ICD-10-CM | POA: Insufficient documentation

## 2013-07-03 LAB — CBC WITH DIFFERENTIAL/PLATELET
Basophils Absolute: 0 10*3/uL (ref 0.0–0.1)
Basophils Relative: 1 % (ref 0–1)
EOS ABS: 0.4 10*3/uL (ref 0.0–0.7)
Eosinophils Relative: 5 % (ref 0–5)
HCT: 44.1 % (ref 39.0–52.0)
HEMOGLOBIN: 14.9 g/dL (ref 13.0–17.0)
LYMPHS ABS: 1.6 10*3/uL (ref 0.7–4.0)
LYMPHS PCT: 18 % (ref 12–46)
MCH: 28.8 pg (ref 26.0–34.0)
MCHC: 33.8 g/dL (ref 30.0–36.0)
MCV: 85.1 fL (ref 78.0–100.0)
MONOS PCT: 7 % (ref 3–12)
Monocytes Absolute: 0.6 10*3/uL (ref 0.1–1.0)
Neutro Abs: 6.1 10*3/uL (ref 1.7–7.7)
Neutrophils Relative %: 69 % (ref 43–77)
PLATELETS: 204 10*3/uL (ref 150–400)
RBC: 5.18 MIL/uL (ref 4.22–5.81)
RDW: 12.8 % (ref 11.5–15.5)
WBC: 8.7 10*3/uL (ref 4.0–10.5)

## 2013-07-03 LAB — PROTIME-INR
INR: 0.96 (ref 0.00–1.49)
PROTHROMBIN TIME: 12.6 s (ref 11.6–15.2)

## 2013-07-03 LAB — APTT: aPTT: 31 seconds (ref 24–37)

## 2013-07-03 MED ORDER — CEFAZOLIN SODIUM-DEXTROSE 2-3 GM-% IV SOLR
2.0000 g | Freq: Once | INTRAVENOUS | Status: AC
  Administered 2013-07-03: 2 g via INTRAVENOUS
  Filled 2013-07-03: qty 50

## 2013-07-03 MED ORDER — SODIUM CHLORIDE 0.9 % IV SOLN
INTRAVENOUS | Status: DC
  Administered 2013-07-03: 09:00:00 via INTRAVENOUS

## 2013-07-03 MED ORDER — HYDROCODONE-ACETAMINOPHEN 5-325 MG PO TABS
1.0000 | ORAL_TABLET | ORAL | Status: DC | PRN
  Administered 2013-07-03: 2 via ORAL
  Filled 2013-07-03: qty 2

## 2013-07-03 MED ORDER — MIDAZOLAM HCL 2 MG/2ML IJ SOLN
INTRAMUSCULAR | Status: AC | PRN
  Administered 2013-07-03 (×5): 1 mg via INTRAVENOUS

## 2013-07-03 MED ORDER — FENTANYL CITRATE 0.05 MG/ML IJ SOLN
INTRAMUSCULAR | Status: AC | PRN
  Administered 2013-07-03 (×5): 25 ug via INTRAVENOUS

## 2013-07-03 MED ORDER — LIDOCAINE-EPINEPHRINE (PF) 2 %-1:200000 IJ SOLN
INTRAMUSCULAR | Status: AC
  Filled 2013-07-03: qty 20

## 2013-07-03 MED ORDER — FENTANYL CITRATE 0.05 MG/ML IJ SOLN
INTRAMUSCULAR | Status: AC
  Filled 2013-07-03: qty 6

## 2013-07-03 MED ORDER — HYDROCODONE-ACETAMINOPHEN 5-325 MG PO TABS: 1.0000 | ORAL_TABLET | Freq: Four times a day (QID) | ORAL | Status: DC | PRN

## 2013-07-03 MED ORDER — MIDAZOLAM HCL 2 MG/2ML IJ SOLN
INTRAMUSCULAR | Status: AC
  Filled 2013-07-03: qty 6

## 2013-07-03 NOTE — Discharge Instructions (Signed)
Moderate Sedation, Adult Moderate sedation is given to help you relax or even sleep through a procedure. You may remain sleepy, be clumsy, or have poor balance for several hours following this procedure. Arrange for a responsible adult, family member, or friend to take you home. A responsible adult should stay with you for at least 24 hours or until the medicines have worn off.  Do not participate in any activities where you could become injured for the next 24 hours, or until you feel normal again. Do not:  Drive.  Swim.  Ride a bicycle.  Operate heavy machinery.  Cook.  Use power tools.  Climb ladders.  Work at [employer].  Do not make important decisions or sign legal documents until you are improved.  Vomiting may occur if you eat too soon. When you can drink without vomiting, try water, juice, or soup. Try solid foods if you feel little or no nausea.  Only take over-the-counter or prescription medications for pain, discomfort, or fever as directed by your caregiver.If pain medications have been prescribed for you, ask your caregiver how soon it is safe to take them.  Make sure you and your family fully understands everything about the medication given to you. Make sure you understand what side effects may occur.  You should not drink alcohol, take sleeping pills, or medications that cause drowsiness for at least 24 hours.  If you smoke, do not smoke alone.  If you are feeling better, you may resume normal activities 24 hours after receiving sedation.  Keep all appointments as scheduled. Follow all instructions.  Ask questions if you do not understand. SEEK MEDICAL CARE IF:   Your skin is pale or bluish in color.  You continue to feel sick to your stomach (nauseous) or throw up (vomit).  Your pain is getting worse and not helped by medication.  You have bleeding or swelling.  You are still sleepy or feeling clumsy after 24 hours. SEEK IMMEDIATE MEDICAL CARE IF:    You develop a rash.  You have difficulty breathing.  You develop any type of allergic problem.  You have a fever. Document Released: 11/21/2000 Document Revised: 05/21/2011 Document Reviewed: 11/03/2012 [facility] Patient Information 2014 [place]. Pleural Effusion The lining covering your lungs and the inside of your chest is called the pleura. Usually, the space between the 2 pleura contains no air and only a thin layer of fluid. A pleural effusion is an abnormal buildup of fluid in the pleural space. Fluid gathers when there is increased pressure in the lung vessels. This forces fluids out of the lungs and into the pleural space. Vessels may also leak fluids when there are infections, such as pneumonia, or other causes of soreness and redness (inflammation). Fluids leak into the lungs when protein in the blood is low or when certain vessels (lymphatics) are blocked. Finding a pleural effusion is important because it is usually caused by another disease. In order to treat a pleural effusion, your caregiver needs to find its cause. If left untreated, a large amount of fluid can build up and cause collapse of the lung. CAUSES   Heart failure.  Infections (pneumonia, tuberculosis), pulmonary embolism, pulmonary infarction.  Cancer (primary lung and metastatic), asbestosis.  Liver failure (cirrhosis).  Nephrotic syndrome, peritoneal dialysis, kidney problems (uremia).  Collagen vascular disease (systemic lupus erythematosis, rheumatoid arthritis).  Injury (trauma) to the chest or rupture of the digestive tube (esophagus).  Material in the chest or pleural space (hemothorax, chylothorax).  Pancreatitis.  Surgery.  Drug reactions. SYMPTOMS  A pleural effusion can decrease the amount of space available for breathing and make you short of breath. The fluid can become infected, which may cause pain and fever. Often, the pain is worse when taking a deep breath. The underlying  disease (heart failure, pneumonia, blood clot, tuberculosis, cancer) may also cause symptoms. DIAGNOSIS   Your caregiver can usually tell what is wrong by talking to you (taking a history), doing an exam, and taking a routine X-ray. If the X-ray shows fluid in your chest, often fluid is removed from your chest with a needle for testing (diagnostic thoracentesis).  Sometimes, more specialized X-rays may be needed.  Sometimes, a small piece of tissue is removed and examined by a specialist (biopsy). TREATMENT  Treatment varies based on what caused the pleural effusion. Treatments include:  Removing as much fluid as possible using a needle (thoracentesis) to improve the cough and shortness of breath. This is a simple procedure which can be done at bedside. The risks are bleeding, infection, collapse of a lung, or low blood pressure.  Placing a tube in the chest to drain the effusion (tube thoracostomy). This is often used when there is an infection in the fluid. This is a simple procedure which can often be done at bedside or in a clinic. The procedure may be painful. The risks are the same as using a needle to drain the fluid. The chest tube usually remains for a few days and is connected to suction to improve fluid drainage. The tube, after placement, usually does not cause much discomfort.  Surgical removal of fibrous debris in and around the pleural space (decortication). This may be done with a flexible telescope (thoracoscope) through a small or large cut (incision). This is helpful for patients who have fibrosis or scar tissue that prevents complete lung expansion. The risks are infection, blood loss, and side effects from general anesthesia.  Sometimes, a procedure called pleurodesis is done. A chest tube is placed and the fluid is drained. Next, an agent (tetracycline, talc powder) is added to the pleural space. This causes the lung and chest wall to stick together (adhesion). This leaves no  potential space for fluid to build up. The risks include infection, blood loss, and side effects from general anesthesia.  If the effusion is caused by infection, it may be treated with antibiotics and improve without draining. HOME CARE INSTRUCTIONS   Take any medicines exactly as prescribed.  Follow up with your caregiver as directed.  Monitor your exercise capacity (the amount of walking you can do before you get short of breath).  Do not smoke. Ask your caregiver for help quitting. SEEK MEDICAL CARE IF:   Your exercise capacity seems to get worse or does not improve with time.  You do not recover from your illness. SEEK IMMEDIATE MEDICAL CARE IF:   Shortness of breath or chest pain develops or gets worse.  You have an oral temperature above 102 F (38.9 C), not controlled by medicine.  You develop a new cough, especially if the mucus (phlegm) is discolored. MAKE SURE YOU:   Understand these instructions.  Will watch your condition.  Will get help right away if you are not doing well or get worse. Document Released: 02/26/2005 Document Revised: 12/17/2012 Document Reviewed: 10/18/2006 [facility] Patient Information 2014 [place].

## 2013-07-03 NOTE — Progress Notes (Signed)
[name] [name], P.A. Came in and explained pleurex catheter kit to pt and wife.  Pt given patient guide booklet regarding pleurex catheter kit as well.  Pt states he does not have any questions, the P.A. Explained everything.

## 2013-07-03 NOTE — Progress Notes (Signed)
Pt has portacath that he reports has not been accessed in 2 years; therefore, PIV initiated.

## 2013-07-03 NOTE — H&P (Signed)
Chief Complaint: "I'm here for a drain" Referring Physician:Mohamed HPI: [name] [name] is an 58 y.o. 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 He had US thoracentesis on 3/31 which proved to be malignant He is now scheduled for placement of tunneled pleural drain PMHx and meds reviewed.  Past Medical History:  Past Medical History  Diagnosis Date  . Hypertension   . lung ca dx'd 05/2007    chemo comp 12/2009  . 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 Hx antineoplastic chemo 2011    Past Surgical History: History reviewed. No pertinent past surgical history.  Family History:  Family History  Problem Relation Age of Onset  . Cancer Brother     Social History:  reports that he has been smoking Cigarettes.  He has been smoking about 0.00 packs per day. He has never used smokeless tobacco. He reports that he does not drink alcohol or use illicit drugs.  Allergies: No Known Allergies  Medications:   Medication List    ASK your doctor about these medications       ALPRAZolam 1 MG tablet  Commonly known as:  XANAX  Take 1 mg by mouth 3 (three) times daily as needed for anxiety.     amLODipine 10 MG tablet  Commonly known as:  NORVASC  Take 10 mg by mouth every morning.     erlotinib 150 MG tablet  Commonly known as:  TARCEVA  Take 1 tablet (150 mg total) by mouth daily. Take on an empty stomach 1 hour before meals or 2 hours after.     fluticasone 50 MCG/ACT nasal spray  Commonly known as:  FLONASE     folic acid 778 MCG tablet  Commonly known as:  FOLVITE  Take 400 mcg by mouth daily.     metoprolol succinate 50 MG 24 hr tablet  Commonly known as:  TOPROL-XL  Take 50 mg by mouth daily with lunch. Take with or immediately following a meal.     OXYCONTIN 40 mg T12a 12 hr tablet  Generic drug:  OxyCODONE  Take 40 mg by mouth 3 (three) times daily.     Oxycodone HCl 20 MG Tabs  Take 20 mg by mouth 3 (three) times daily  as needed (pain).     promethazine 25 MG suppository  Commonly known as:  PHENERGAN  Place 25 mg rectally every 6 (six) hours as needed for nausea.     sertraline 100 MG tablet  Commonly known as:  ZOLOFT  Take 100 mg by mouth daily with lunch.     zolpidem 10 MG tablet  Commonly known as:  AMBIEN  Take 10 mg by mouth at bedtime as needed for sleep.        Please HPI for pertinent positives, otherwise complete 10 system ROS negative.  Physical Exam: BP 170/84  Pulse 86  Temp(Src) 98.4 F (36.9 C) (Oral)  Resp 18  SpO2 97% There is no weight on file to calculate BMI.   General Appearance:  Alert, cooperative, no distress, appears stated age  Head:  Normocephalic, without obvious abnormality, atraumatic  ENT: Unremarkable  Neck: Supple, symmetrical, trachea midline  Lungs:   Diminished on right, clear on left  Chest Wall:  No tenderness or deformity  Heart:  Regular rate and rhythm, S1, S2 normal, no murmur, rub or gallop.  Abdomen:   Soft, non-tender, non distended.  Neurologic: Normal affect, no gross deficits.  Results for orders placed during the hospital encounter of 07/03/13 (from the past 48 hour(s))  APTT     Status: None   Collection Time    07/03/13  9:17 AM      Result Value Ref Range   aPTT 31  24 - 37 seconds  CBC WITH DIFFERENTIAL     Status: None   Collection Time    07/03/13  9:17 AM      Result Value Ref Range   WBC 8.7  4.0 - 10.5 K/uL   RBC 5.18  4.22 - 5.81 MIL/uL   Hemoglobin 14.9  13.0 - 17.0 g/dL   HCT 44.1  39.0 - 52.0 %   MCV 85.1  78.0 - 100.0 fL   MCH 28.8  26.0 - 34.0 pg   MCHC 33.8  30.0 - 36.0 g/dL   RDW 12.8  11.5 - 15.5 %   Platelets 204  150 - 400 K/uL   Neutrophils Relative % 69  43 - 77 %   Neutro Abs 6.1  1.7 - 7.7 K/uL   Lymphocytes Relative 18  12 - 46 %   Lymphs Abs 1.6  0.7 - 4.0 K/uL   Monocytes Relative 7  3 - 12 %   Monocytes Absolute 0.6  0.1 - 1.0 K/uL   Eosinophils Relative 5  0 - 5 %   Eosinophils Absolute  0.4  0.0 - 0.7 K/uL   Basophils Relative 1  0 - 1 %   Basophils Absolute 0.0  0.0 - 0.1 K/uL  PROTIME-INR     Status: None   Collection Time    07/03/13  9:17 AM      Result Value Ref Range   Prothrombin Time 12.6  11.6 - 15.2 seconds   INR 0.96  0.00 - 1.49   No results found.  Assessment/Plan Metastatic lung cancer with malignant right pleural effusion For tunneled pleural catheter placement. Discussed procedure, risks, complications, use of sedation. Labs reviewed, ok Consent signed in chart  Ascencion Dike PA-C 07/03/2013, 10:55 AM

## 2013-07-03 NOTE — Telephone Encounter (Signed)
[name] wife called stating that pt is worried about having pain after his pleur-x catheter placement.  He has short acting 20mg  oxycodone at home but only has 3 pills left.  Advised she call her PCP regarding a refill.  She states that the MD has left for the day and they require 72 hours for refill.  Consulted with Dr Benay Spice (Dr Julien Nordmann is off today).  Per Dr Benay Spice, okay to do 10 tabs of hydrocodone 1 tab q6h prn. SLJ

## 2013-07-03 NOTE — Procedures (Signed)
Successful placement of a left sided pleural drainage catheter. No immediate complications.

## 2013-07-03 NOTE — Progress Notes (Signed)
Received letter from [employer].  Pt is approved for Tarceva from 07/03/13 to 07/03/14 or when the benefit cap has been met.  Expenses can be submitted for reimbursement for dos 04/04/13 to 07/03/14.  Amount of grant is $7500.

## 2013-07-13 ENCOUNTER — Telehealth: Payer: Self-pay | Admitting: *Deleted

## 2013-07-13 NOTE — Telephone Encounter (Signed)
[name] wife called stating that pt will not be coming to his lab and f/u on 07/14/13.  He started having vomiting and diarrhea on 4/30 and stopped taking the Tarceva.  He is still not taking the Tarceva.  He has taken his phenergan rx for his vomiting but has not taken anything for his diarrhea.  Advised that he come to the clinic to be seen on 5/5.  Advised that he needs to call the clinic when he has any issues with vomiting or diarrhea so that we can determine how to help manage his symptoms.  Educated regarding taking imodium for diarrhea.  Stated that there are times when Dr Vista Mink does advise that the Tarceva be held for a few days but it needs to be doctor recommended.  Stated that if he does not come to the appointment tomorrow he needs to r/s ASAP so his symptoms can be managed safely.  If he does not come to f/u appts we would not be able to fill the Tarceva.  [name] wife states "we will not be coming to the appointment tomorrow, we will make a new appointment, and he will start taking the Tarceva again when he starts eating".  Will inform Dr Vista Mink.  SLJ

## 2013-07-14 ENCOUNTER — Other Ambulatory Visit: Payer: Medicare Other

## 2013-07-14 ENCOUNTER — Ambulatory Visit: Payer: Medicare Other | Admitting: Internal Medicine

## 2013-07-15 ENCOUNTER — Telehealth: Payer: Self-pay | Admitting: Internal Medicine

## 2013-07-15 NOTE — Telephone Encounter (Signed)
pt called to r/s done...pt awar of new d.t

## 2013-07-20 ENCOUNTER — Encounter: Payer: Self-pay | Admitting: Internal Medicine

## 2013-07-20 ENCOUNTER — Ambulatory Visit (HOSPITAL_BASED_OUTPATIENT_CLINIC_OR_DEPARTMENT_OTHER): Payer: Medicare Other | Admitting: Internal Medicine

## 2013-07-20 ENCOUNTER — Telehealth: Payer: Self-pay | Admitting: Internal Medicine

## 2013-07-20 ENCOUNTER — Other Ambulatory Visit (HOSPITAL_BASED_OUTPATIENT_CLINIC_OR_DEPARTMENT_OTHER): Payer: Medicare Other

## 2013-07-20 VITALS — BP 170/70 | HR 90 | Temp 98.2°F | Resp 18 | Ht 70.0 in | Wt 158.2 lb

## 2013-07-20 DIAGNOSIS — J90 Pleural effusion, not elsewhere classified: Secondary | ICD-10-CM

## 2013-07-20 DIAGNOSIS — C3430 Malignant neoplasm of lower lobe, unspecified bronchus or lung: Secondary | ICD-10-CM

## 2013-07-20 DIAGNOSIS — C3490 Malignant neoplasm of unspecified part of unspecified bronchus or lung: Secondary | ICD-10-CM

## 2013-07-20 LAB — CBC WITH DIFFERENTIAL/PLATELET
BASO%: 0.6 % (ref 0.0–2.0)
Basophils Absolute: 0.1 10*3/uL (ref 0.0–0.1)
EOS%: 2.6 % (ref 0.0–7.0)
Eosinophils Absolute: 0.3 10*3/uL (ref 0.0–0.5)
HCT: 39.5 % (ref 38.4–49.9)
HGB: 13.1 g/dL (ref 13.0–17.1)
LYMPH%: 7 % — AB (ref 14.0–49.0)
MCH: 28.5 pg (ref 27.2–33.4)
MCHC: 33.1 g/dL (ref 32.0–36.0)
MCV: 86.1 fL (ref 79.3–98.0)
MONO#: 0.9 10*3/uL (ref 0.1–0.9)
MONO%: 8.2 % (ref 0.0–14.0)
NEUT#: 8.7 10*3/uL — ABNORMAL HIGH (ref 1.5–6.5)
NEUT%: 81.6 % — ABNORMAL HIGH (ref 39.0–75.0)
PLATELETS: 231 10*3/uL (ref 140–400)
RBC: 4.58 10*6/uL (ref 4.20–5.82)
RDW: 14 % (ref 11.0–14.6)
WBC: 10.7 10*3/uL — ABNORMAL HIGH (ref 4.0–10.3)
lymph#: 0.7 10*3/uL — ABNORMAL LOW (ref 0.9–3.3)

## 2013-07-20 LAB — COMPREHENSIVE METABOLIC PANEL (CC13)
ALK PHOS: 61 U/L (ref 40–150)
ALT: 9 U/L (ref 0–55)
AST: 13 U/L (ref 5–34)
Albumin: 2.7 g/dL — ABNORMAL LOW (ref 3.5–5.0)
Anion Gap: 9 mEq/L (ref 3–11)
BILIRUBIN TOTAL: 0.39 mg/dL (ref 0.20–1.20)
BUN: 15.4 mg/dL (ref 7.0–26.0)
CO2: 26 mEq/L (ref 22–29)
Calcium: 9 mg/dL (ref 8.4–10.4)
Chloride: 109 mEq/L (ref 98–109)
Creatinine: 1.4 mg/dL — ABNORMAL HIGH (ref 0.7–1.3)
Glucose: 152 mg/dl — ABNORMAL HIGH (ref 70–140)
Potassium: 3.4 mEq/L — ABNORMAL LOW (ref 3.5–5.1)
Sodium: 144 mEq/L (ref 136–145)
TOTAL PROTEIN: 5.5 g/dL — AB (ref 6.4–8.3)

## 2013-07-20 NOTE — Telephone Encounter (Signed)
gv pt appt schedule for may.  °

## 2013-07-20 NOTE — Progress Notes (Signed)
[name] [name]:([phone]   Fax:([phone]) [phone]  OFFICE PROGRESS NOTE  [name] Seller, MD [address] [place] [address]  PRINCIPAL DIAGNOSIS AND STAGE:  1. Metastatic non-small cell lung cancer, adenocarcinoma diagnosed in June of 2009. 2. Renal insufficiency followed by Dr. Posey Pronto. 3. History of seizure followed by Dr. Doy Mince.  PRIOR THERAPY:  1. Status post 6 cycles of systemic chemotherapy with carboplatin, Alimta, and Avastin. Last dose was given December 25, 2007, and the patient had partial response to this treatment.  2. Status post maintenance chemotherapy with Alimta 500 mg per meter squared and Avastin 15 mg/kg given every 3 weeks, status post 23 cycles. Last dose was given May 23, 2009. The patient had stable disease but the treatment was discontinued secondary to toxicity and renal insufficiency. 3. Right Pleurx catheter placement by interventional radiology on 07/03/2013  CURRENT THERAPY: Tarceva 150 mg by mouth daily, started 07/04/2013.  CHEMOTHERAPY INTENT: Palliative  CURRENT # OF CHEMOTHERAPY CYCLES: 1  CURRENT ANTIEMETICS: N/A  CURRENT SMOKING STATUS: Occasional smoker, strongley advised to quit smoking.  ORAL CHEMOTHERAPY AND CONSENT: None  CURRENT BISPHOSPHONATES USE: None  PAIN MANAGEMENT:1/10  NARCOTICS INDUCED CONSTIPATION: N/A  LIVING WILL AND CODE STATUS: Full code.   INTERVAL HISTORY: [name] [name] 59 y.o. male returns to the clinic today for followup visit accompanied by his girlfriend. He was started on treatment with Tarceva 150 mg by mouth daily on 07/04/2013 but the patient is around 7 days of his treatment secondary to nausea and vomiting that most likely was related to his treatment. He resumed his treatment again on 07/17/2013 with no significant complaints since that time. He has a right Pleurx catheter placement by interventional radiology on 07/03/2013. It was drained only one time with 1000 mL of  pleural fluid. He denied having any significant cough or hemoptysis. The patient denied having any fever or chills. He denied having any significant nausea or vomiting. He has no weight loss or night sweats. He has very mild skin rash on the face.   MEDICAL HISTORY: Past Medical History  Diagnosis Date  . Hypertension   . lung ca dx'd 05/2007    chemo comp 12/2009  . 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 Hx antineoplastic chemo 2011    ALLERGIES:  has No Known Allergies.  MEDICATIONS:  Current Outpatient Prescriptions  Medication Sig Dispense Refill  . ALPRAZolam (XANAX) 1 MG tablet Take 1 mg by mouth 3 (three) times daily as needed for anxiety.       Marland Kitchen amLODipine (NORVASC) 10 MG tablet Take 10 mg by mouth every morning.      . erlotinib (TARCEVA) 150 MG tablet Take 1 tablet (150 mg total) by mouth daily. Take on an empty stomach 1 hour before meals or 2 hours after.  30 tablet  2  . fluticasone (FLONASE) 50 MCG/ACT nasal spray       . folic acid (FOLVITE) 330 MCG tablet Take 400 mcg by mouth daily.      . metoprolol succinate (TOPROL-XL) 50 MG 24 hr tablet Take 50 mg by mouth daily with lunch. Take with or immediately following a meal.      . OxyCODONE (OXYCONTIN) 40 mg T12A 12 hr tablet Take 40 mg by mouth 3 (three) times daily.      . Oxycodone HCl 20 MG TABS Take 20 mg by mouth 3 (three) times daily  as needed (pain).      Marland Kitchen sertraline (ZOLOFT) 100 MG tablet Take 100 mg by mouth daily with lunch.      . zolpidem (AMBIEN) 10 MG tablet Take 10 mg by mouth at bedtime as needed for sleep.       Marland Kitchen HYDROcodone-acetaminophen (NORCO/VICODIN) 5-325 MG per tablet Take 1 tablet by mouth every 6 (six) hours as needed for moderate pain.  10 tablet  0  . promethazine (PHENERGAN) 25 MG suppository Place 25 mg rectally every 6 (six) hours as needed for nausea.        No current facility-administered medications for this visit.    REVIEW OF SYSTEMS:  Constitutional:  positive for fatigue Eyes: negative Ears, nose, mouth, throat, and face: negative Respiratory: positive for dyspnea on exertion and pleurisy/chest pain Cardiovascular: negative Gastrointestinal: negative Genitourinary:negative Integument/breast: negative Hematologic/lymphatic: negative Musculoskeletal:negative Neurological: negative Behavioral/Psych: negative Endocrine: negative Allergic/Immunologic: negative   PHYSICAL EXAMINATION: General appearance: alert, cooperative and no distress Head: Normocephalic, without obvious abnormality, atraumatic Neck: no adenopathy Lymph nodes: Cervical, supraclavicular, and axillary nodes normal. Resp: clear to auscultation bilaterally Back: symmetric, no curvature. ROM normal. No CVA tenderness. Cardio: regular rate and rhythm, S1, S2 normal, no murmur, click, rub or gallop GI: soft, non-tender; bowel sounds normal; no masses,  no organomegaly Extremities: extremities normal, atraumatic, no cyanosis or edema Neurologic: Alert and oriented X 3, normal strength and tone. Normal symmetric reflexes. Normal coordination and gait  ECOG PERFORMANCE STATUS: 1 - Symptomatic but completely ambulatory  There were no vitals taken for this visit.  LABORATORY DATA: Lab Results  Component Value Date   WBC 8.7 07/03/2013   HGB 14.9 07/03/2013   HCT 44.1 07/03/2013   MCV 85.1 07/03/2013   PLT 204 07/03/2013      Chemistry      Component Value Date/Time   NA 144 06/03/2013 0947   NA 143 12/21/2011 1429   NA 144 07/18/2011 1546   K 4.2 06/03/2013 0947   K 4.1 12/21/2011 1429   K 4.5 07/18/2011 1546   CL 110* 02/20/2012 1516   CL 109 12/21/2011 1429   CL 101 07/18/2011 1546   CO2 24 06/03/2013 0947   CO2 25 12/21/2011 1429   CO2 28 07/18/2011 1546   BUN 19.9 06/03/2013 0947   BUN 16 12/21/2011 1429   BUN 20 07/18/2011 1546   CREATININE 1.7* 06/03/2013 0947   CREATININE 1.70* 12/21/2011 1429   CREATININE 1.8* 07/18/2011 1546      Component Value Date/Time    CALCIUM 9.5 06/03/2013 0947   CALCIUM 9.2 12/21/2011 1429   CALCIUM 8.8 07/18/2011 1546   ALKPHOS 74 06/03/2013 0947   ALKPHOS 65 12/21/2011 1429   ALKPHOS 83 07/18/2011 1546   AST 12 06/03/2013 0947   AST 12 12/21/2011 1429   AST 19 07/18/2011 1546   ALT <6 06/03/2013 0947   ALT <8 12/21/2011 1429   ALT 12 07/18/2011 1546   BILITOT 0.38 06/03/2013 0947   BILITOT 0.3 12/21/2011 1429   BILITOT 0.50 07/18/2011 1546       RADIOGRAPHIC STUDIES: US Guided Needle Placement  07/03/2013   CLINICAL DATA:  History of lung cancer, now with recurrent symptomatic right-sided pleural effusion. Please placed tunneled PleurX catheter for palliation purposes.  EXAM: INSERTION OF TUNNELED RIGHT SIDED PLEURAL DRAINAGE CATHETER  COMPARISON:  Chest radiograph - 05/20/2013; chest CT - 06/03/2013; ultrasound-guided right-sided thoracentesis - 06/09/2013  MEDICATIONS: Ancef 2 g IV; Antibiotic was administered in an appropriate time  interval for the procedure.  ANESTHESIA/SEDATION: Versed 5 mg IV; Fentanyl 125 mcg IV  Total Moderate Sedation Time  25 minutes.  FLUOROSCOPY TIME:  FLUOROSCOPY TIME 30 seconds  COMPLICATIONS: None immediate  PROCEDURE: The procedure, risks, benefits, and alternatives were explained to the patient, who wish to proceed with the placement of this permanent pleural catheter. The patient understand and consent to the procedure.  The right lateral chest and upper abdomen were prepped with Chlorhexidine in a sterile fashion, and a sterile drape was applied covering the operative field. A sterile gown and sterile gloves were used for the procedure. Initial ultrasound scanning and fluoroscopic imaging demonstrates a recurrent moderate to large pleural effusion.  Under direct ultrasound guidance, the right inferior lateral pleural space was accessed with a Yueh sheath needle after the overlying soft tissues were anesthetized with 1% lidocaine with epinephrine. An Amplatz super stiff wire was then advanced under  fluoroscopy into the pleural space.  A 16 French tunneled Bard Aspira catheter was tunneled from an incision within the right upper abdominal quadrant to the access site. The pleural access site was serially dilated under fluoroscopy, ultimately allowing placement of a peel-away sheath. The catheter was advanced through the peel-away sheath. The sheath was then removed. Final catheter positioning was confirmed with a fluoroscopic radiographic image.  The access incision was closed with subcutaneous subcuticular 4-0 Vicryl, Dermabond and Steri-Strips. A Prolene retention suture was applied at the catheter exit site. Large volume thoracentesis was performed through the new catheter utilizing provided bulb vacuum assisted drainage bag. The patient tolerated the above procedure well without immediate postprocedural complication.  FINDINGS: Preprocedural ultrasound scanning demonstrates a recurrent large sized anechoic right-sided pleural effusion.  After ultrasound and fluoroscopic guided placement, the catheter is directed towards the medial aspect of the right lung apex.  Following catheter placement, approximately 1.9 L of serious pleural fluid was removed.  IMPRESSION: Successful placement of permanent, tunneled right pleural drainage catheter via a lateral approach. Approximately 1.9 liters of serous pleural fluid was removed after catheter placement.   Electronically Signed   By: Sandi Mariscal M.D.   On: 07/03/2013 14:46    ASSESSMENT AND PLAN: This is a very pleasant 58 years old white male with history of metastatic non-small cell lung cancer, adenocarcinoma status post systemic chemotherapy with carboplatin, Alimta and Avastin followed by maintenance Alimta and Avastin but has been observation since March of 2011, presented recently by stereotactic radiotherapy to enlarging right lung nodules under the care of Dr. Tammi Klippel. His recent scan showed market interval progression of the right pleural effusion with  slight progression of the left lower lobe pulmonary nodules. The patient was started on treatment with Tarceva 150 mg by mouth daily. He took around 8 days total till now. He is tolerating his treatment fairly well with no significant adverse effects except for very mild skin rash but he also has few episodes of diarrhea that most likely was unrelated to his treatment. I recommended for him to continue his current treatment with Tarceva. I would see him back for followup visit in 2 weeks for reevaluation and management any adverse effect of his treatment. He was advised to do the drainage of the Pleurx catheter at least twice a week. He was advised to call immediately if he has any concerning symptoms in the interval. The patient voices understanding of current disease status and treatment options and is in agreement with the current care plan.  All questions were answered. The patient knows  to call the clinic with any problems, questions or concerns. We can certainly see the patient much sooner if necessary.  Disclaimer: This note was dictated with voice recognition software. Similar sounding words can inadvertently be transcribed and may not be corrected upon review.

## 2013-07-20 NOTE — Patient Instructions (Signed)
You Can Quit Smoking If you are ready to quit smoking or are thinking about it, congratulations! You have chosen to help yourself be healthier and live longer! There are lots of different ways to quit smoking. Nicotine gum, nicotine patches, a nicotine inhaler, or nicotine nasal spray can help with physical craving. Hypnosis, support groups, and medicines help break the habit of smoking. TIPS TO GET OFF AND STAY OFF CIGARETTES  Learn to predict your moods. Do not let a bad situation be your excuse to have a cigarette. Some situations in your life might tempt you to have a cigarette.  Ask friends and co-workers not to smoke around you.  Make your home smoke-free.  Never have "just one" cigarette. It leads to wanting another and another. Remind yourself of your decision to quit.  On a card, make a list of your reasons for not smoking. Read it at least the same number of times a day as you have a cigarette. Tell yourself everyday, "I do not want to smoke. I choose not to smoke."  Ask someone at home or work to help you with your plan to quit smoking.  Have something planned after you eat or have a cup of coffee. Take a walk or get other exercise to perk you up. This will help to keep you from overeating.  Try a relaxation exercise to calm you down and decrease your stress. Remember, you may be tense and nervous the first two weeks after you quit. This will pass.  Find new activities to keep your hands busy. Play with a pen, coin, or rubber band. Doodle or draw things on paper.  Brush your teeth right after eating. This will help cut down the craving for the taste of tobacco after meals. You can try mouthwash too.  Try gum, breath mints, or diet candy to keep something in your mouth. IF YOU SMOKE AND WANT TO QUIT:  Do not stock up on cigarettes. Never buy a carton. Wait until one pack is finished before you buy another.  Never carry cigarettes with you at work or at home.  Keep cigarettes  as far away from you as possible. Leave them with someone else.  Never carry matches or a lighter with you.  Ask yourself, "Do I need this cigarette or is this just a reflex?"  Bet with someone that you can quit. Put cigarette money in a piggy bank every morning. If you smoke, you give up the money. If you do not smoke, by the end of the week, you keep the money.  Keep trying. It takes 21 days to change a habit!  Talk to your doctor about using medicines to help you quit. These include nicotine replacement gum, lozenges, or skin patches. Document Released: 12/23/2008 Document Revised: 05/21/2011 Document Reviewed: 12/23/2008 [employer] Patient Information 2014 [employer].  

## 2013-08-06 ENCOUNTER — Other Ambulatory Visit (HOSPITAL_BASED_OUTPATIENT_CLINIC_OR_DEPARTMENT_OTHER): Payer: Medicare Other

## 2013-08-06 ENCOUNTER — Ambulatory Visit (HOSPITAL_BASED_OUTPATIENT_CLINIC_OR_DEPARTMENT_OTHER): Payer: Medicare Other | Admitting: Physician Assistant

## 2013-08-06 ENCOUNTER — Telehealth: Payer: Self-pay | Admitting: Internal Medicine

## 2013-08-06 ENCOUNTER — Encounter: Payer: Self-pay | Admitting: Physician Assistant

## 2013-08-06 VITALS — BP 151/80 | HR 87 | Temp 98.6°F | Resp 18 | Ht 70.0 in | Wt 150.4 lb

## 2013-08-06 DIAGNOSIS — C3490 Malignant neoplasm of unspecified part of unspecified bronchus or lung: Secondary | ICD-10-CM

## 2013-08-06 DIAGNOSIS — J90 Pleural effusion, not elsewhere classified: Secondary | ICD-10-CM

## 2013-08-06 LAB — CBC WITH DIFFERENTIAL/PLATELET
BASO%: 0.7 % (ref 0.0–2.0)
Basophils Absolute: 0.1 10*3/uL (ref 0.0–0.1)
EOS%: 4.2 % (ref 0.0–7.0)
Eosinophils Absolute: 0.4 10*3/uL (ref 0.0–0.5)
HEMATOCRIT: 40.5 % (ref 38.4–49.9)
HGB: 13.5 g/dL (ref 13.0–17.1)
LYMPH%: 11.6 % — AB (ref 14.0–49.0)
MCH: 28.5 pg (ref 27.2–33.4)
MCHC: 33.3 g/dL (ref 32.0–36.0)
MCV: 85.5 fL (ref 79.3–98.0)
MONO#: 0.9 10*3/uL (ref 0.1–0.9)
MONO%: 8.1 % (ref 0.0–14.0)
NEUT#: 7.9 10*3/uL — ABNORMAL HIGH (ref 1.5–6.5)
NEUT%: 75.4 % — AB (ref 39.0–75.0)
PLATELETS: 301 10*3/uL (ref 140–400)
RBC: 4.73 10*6/uL (ref 4.20–5.82)
RDW: 13.4 % (ref 11.0–14.6)
WBC: 10.5 10*3/uL — AB (ref 4.0–10.3)
lymph#: 1.2 10*3/uL (ref 0.9–3.3)

## 2013-08-06 LAB — COMPREHENSIVE METABOLIC PANEL (CC13)
ALT: 8 U/L (ref 0–55)
ANION GAP: 11 meq/L (ref 3–11)
AST: 15 U/L (ref 5–34)
Albumin: 2.7 g/dL — ABNORMAL LOW (ref 3.5–5.0)
Alkaline Phosphatase: 73 U/L (ref 40–150)
BILIRUBIN TOTAL: 0.28 mg/dL (ref 0.20–1.20)
BUN: 14.8 mg/dL (ref 7.0–26.0)
CO2: 24 mEq/L (ref 22–29)
CREATININE: 1.4 mg/dL — AB (ref 0.7–1.3)
Calcium: 9.1 mg/dL (ref 8.4–10.4)
Chloride: 106 mEq/L (ref 98–109)
GLUCOSE: 114 mg/dL (ref 70–140)
Potassium: 3.7 mEq/L (ref 3.5–5.1)
Sodium: 141 mEq/L (ref 136–145)
Total Protein: 5.9 g/dL — ABNORMAL LOW (ref 6.4–8.3)

## 2013-08-06 NOTE — Telephone Encounter (Signed)
gv and printed appt sched and avs for opt for June

## 2013-08-06 NOTE — Progress Notes (Addendum)
[facility] [phone]   [phone]  OFFICE PROGRESS NOTE  Woody Seller, MD [address] [place] [address]  PRINCIPAL DIAGNOSIS AND STAGE:  1. Metastatic non-small cell lung cancer, adenocarcinoma diagnosed in June of 2009. 2. Renal insufficiency followed by Dr. Posey Pronto. 3. History of seizure followed by Dr. Doy Mince.  PRIOR THERAPY:  1. Status post 6 cycles of systemic chemotherapy with carboplatin, Alimta, and Avastin. Last dose was given December 25, 2007, and the patient had partial response to this treatment.  2. Status post maintenance chemotherapy with Alimta 500 mg per meter squared and Avastin 15 mg/kg given every 3 weeks, status post 23 cycles. Last dose was given May 23, 2009. The patient had stable disease but the treatment was discontinued secondary to toxicity and renal insufficiency. 3. Right Pleurx catheter placement by interventional radiology on 07/03/2013  CURRENT THERAPY: Tarceva 150 mg by mouth daily, started 07/04/2013. Status post approximately 1 month of therapy  CHEMOTHERAPY INTENT: Palliative  CURRENT # OF CHEMOTHERAPY CYCLES: 1  CURRENT ANTIEMETICS: N/A  CURRENT SMOKING STATUS: Occasional smoker, strongley advised to quit smoking.  ORAL CHEMOTHERAPY AND CONSENT: None  CURRENT BISPHOSPHONATES USE: None  PAIN MANAGEMENT:1/10  NARCOTICS INDUCED CONSTIPATION: N/A  LIVING WILL AND CODE STATUS: Full code.   INTERVAL HISTORY: [name] [name] 58 y.o. male returns to the clinic today for followup visit accompanied by his girlfriend. He was started on treatment with Tarceva 150 mg by mouth daily on 07/04/2013 but the patient is around 7 days of his treatment secondary to nausea and vomiting that most likely was related to his treatment. He resumed his treatment again on 07/17/2013 with no significant complaints since that time. He reports tolerating the Tarceva better. He has had improvement in his skin rash  and Alimta reports occasional episodes of diarrhea. He has a right Pleurx catheter placement by interventional radiology on 07/03/2013. Since his last office visit he is careful to drain his Pleurx catheter on 4 different occasions. The first time yielded 1500 MLS, then 1000 mL than 650 ML's and most recently 46 ML's. There do to drain the Pleurx catheter again today. He finds the Pleurx catheter uncomfortable and annoying but denied any significant pain.He denied having any significant cough or hemoptysis. The patient denied having any fever or chills. He denied having any significant nausea or vomiting. He has no weight loss or night sweats.   MEDICAL HISTORY: Past Medical History  Diagnosis Date  . Hypertension   . lung ca dx'd 05/2007    chemo comp 12/2009  . 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 Hx antineoplastic chemo 2011    ALLERGIES:  has No Known Allergies.  MEDICATIONS:  Current Outpatient Prescriptions  Medication Sig Dispense Refill  . ALPRAZolam (XANAX) 1 MG tablet Take 1 mg by mouth 3 (three) times daily as needed for anxiety.       Marland Kitchen amLODipine (NORVASC) 10 MG tablet Take 10 mg by mouth every morning.      . erlotinib (TARCEVA) 150 MG tablet Take 1 tablet (150 mg total) by mouth daily. Take on an empty stomach 1 hour before meals or 2 hours after.  30 tablet  2  . fluticasone (FLONASE) 50 MCG/ACT nasal spray       . folic acid (FOLVITE) 672 MCG tablet Take 400 mcg by mouth daily.      . metoprolol succinate (TOPROL-XL) 50  MG 24 hr tablet Take 50 mg by mouth daily with lunch. Take with or immediately following a meal.      . OxyCODONE (OXYCONTIN) 40 mg T12A 12 hr tablet Take 40 mg by mouth 3 (three) times daily.      . Oxycodone HCl 20 MG TABS Take 20 mg by mouth 3 (three) times daily as needed (pain).      . promethazine (PHENERGAN) 25 MG suppository Place 25 mg rectally every 6 (six) hours as needed for nausea.       . sertraline (ZOLOFT) 100 MG  tablet Take 100 mg by mouth daily with lunch.      . zolpidem (AMBIEN) 10 MG tablet Take 10 mg by mouth at bedtime as needed for sleep.       Marland Kitchen HYDROcodone-acetaminophen (NORCO/VICODIN) 5-325 MG per tablet Take 1 tablet by mouth every 6 (six) hours as needed for moderate pain.  10 tablet  0   No current facility-administered medications for this visit.    REVIEW OF SYSTEMS:  Constitutional: positive for fatigue Eyes: negative Ears, nose, mouth, throat, and face: negative Respiratory: positive for dyspnea on exertion and pleurisy/chest pain Cardiovascular: negative Gastrointestinal: negative Genitourinary:negative Integument/breast: positive for rash Hematologic/lymphatic: negative Musculoskeletal:negative Neurological: negative Behavioral/Psych: negative Endocrine: negative Allergic/Immunologic: negative   PHYSICAL EXAMINATION: General appearance: alert, cooperative and no distress Head: Normocephalic, without obvious abnormality, atraumatic Neck: no adenopathy Lymph nodes: Cervical, supraclavicular, and axillary nodes normal. Resp: clear to auscultation bilaterally Back: symmetric, no curvature. ROM normal. No CVA tenderness. Cardio: regular rate and rhythm, S1, S2 normal, no murmur, click, rub or gallop GI: soft, non-tender; bowel sounds normal; no masses,  no organomegaly Extremities: extremities normal, atraumatic, no cyanosis or edema Neurologic: Alert and oriented X 3, normal strength and tone. Normal symmetric reflexes. Normal coordination and gait  ECOG PERFORMANCE STATUS: 1 - Symptomatic but completely ambulatory  Blood pressure 151/80, pulse 87, temperature 98.6 F (37 C), temperature source Oral, resp. rate 18, height 5\' 10"  (1.778 m), weight 150 lb 6.4 oz (68.221 kg), SpO2 97.00%.  LABORATORY DATA: Lab Results  Component Value Date   WBC 10.5* 08/06/2013   HGB 13.5 08/06/2013   HCT 40.5 08/06/2013   MCV 85.5 08/06/2013   PLT 301 08/06/2013      Chemistry       Component Value Date/Time   NA 141 08/06/2013 0844   NA 143 12/21/2011 1429   NA 144 07/18/2011 1546   K 3.7 08/06/2013 0844   K 4.1 12/21/2011 1429   K 4.5 07/18/2011 1546   CL 110* 02/20/2012 1516   CL 109 12/21/2011 1429   CL 101 07/18/2011 1546   CO2 24 08/06/2013 0844   CO2 25 12/21/2011 1429   CO2 28 07/18/2011 1546   BUN 14.8 08/06/2013 0844   BUN 16 12/21/2011 1429   BUN 20 07/18/2011 1546   CREATININE 1.4* 08/06/2013 0844   CREATININE 1.70* 12/21/2011 1429   CREATININE 1.8* 07/18/2011 1546      Component Value Date/Time   CALCIUM 9.1 08/06/2013 0844   CALCIUM 9.2 12/21/2011 1429   CALCIUM 8.8 07/18/2011 1546   ALKPHOS 73 08/06/2013 0844   ALKPHOS 65 12/21/2011 1429   ALKPHOS 83 07/18/2011 1546   AST 15 08/06/2013 0844   AST 12 12/21/2011 1429   AST 19 07/18/2011 1546   ALT 8 08/06/2013 0844   ALT <8 12/21/2011 1429   ALT 12 07/18/2011 1546   BILITOT 0.28 08/06/2013 0844   BILITOT  0.3 12/21/2011 1429   BILITOT 0.50 07/18/2011 1546       RADIOGRAPHIC STUDIES: US Guided Needle Placement  07/03/2013   CLINICAL DATA:  History of lung cancer, now with recurrent symptomatic right-sided pleural effusion. Please placed tunneled PleurX catheter for palliation purposes.  EXAM: INSERTION OF TUNNELED RIGHT SIDED PLEURAL DRAINAGE CATHETER  COMPARISON:  Chest radiograph - 05/20/2013; chest CT - 06/03/2013; ultrasound-guided right-sided thoracentesis - 06/09/2013  MEDICATIONS: Ancef 2 g IV; Antibiotic was administered in an appropriate time interval for the procedure.  ANESTHESIA/SEDATION: Versed 5 mg IV; Fentanyl 125 mcg IV  Total Moderate Sedation Time  25 minutes.  FLUOROSCOPY TIME:  FLUOROSCOPY TIME 30 seconds  COMPLICATIONS: None immediate  PROCEDURE: The procedure, risks, benefits, and alternatives were explained to the patient, who wish to proceed with the placement of this permanent pleural catheter. The patient understand and consent to the procedure.  The right lateral chest and upper abdomen were  prepped with Chlorhexidine in a sterile fashion, and a sterile drape was applied covering the operative field. A sterile gown and sterile gloves were used for the procedure. Initial ultrasound scanning and fluoroscopic imaging demonstrates a recurrent moderate to large pleural effusion.  Under direct ultrasound guidance, the right inferior lateral pleural space was accessed with a Yueh sheath needle after the overlying soft tissues were anesthetized with 1% lidocaine with epinephrine. An Amplatz super stiff wire was then advanced under fluoroscopy into the pleural space.  A 16 French tunneled Bard Aspira catheter was tunneled from an incision within the right upper abdominal quadrant to the access site. The pleural access site was serially dilated under fluoroscopy, ultimately allowing placement of a peel-away sheath. The catheter was advanced through the peel-away sheath. The sheath was then removed. Final catheter positioning was confirmed with a fluoroscopic radiographic image.  The access incision was closed with subcutaneous subcuticular 4-0 Vicryl, Dermabond and Steri-Strips. A Prolene retention suture was applied at the catheter exit site. Large volume thoracentesis was performed through the new catheter utilizing provided bulb vacuum assisted drainage bag. The patient tolerated the above procedure well without immediate postprocedural complication.  FINDINGS: Preprocedural ultrasound scanning demonstrates a recurrent large sized anechoic right-sided pleural effusion.  After ultrasound and fluoroscopic guided placement, the catheter is directed towards the medial aspect of the right lung apex.  Following catheter placement, approximately 1.9 L of serious pleural fluid was removed.  IMPRESSION: Successful placement of permanent, tunneled right pleural drainage catheter via a lateral approach. Approximately 1.9 liters of serous pleural fluid was removed after catheter placement.   Electronically Signed   By:  Sandi Mariscal M.D.   On: 07/03/2013 14:46    ASSESSMENT AND PLAN: This is a very pleasant 58 years old white male with history of metastatic non-small cell lung cancer, adenocarcinoma status post systemic chemotherapy with carboplatin, Alimta and Avastin followed by maintenance Alimta and Avastin but has been observation since March of 2011, presented recently by stereotactic radiotherapy to enlarging right lung nodules under the care of Dr. Tammi Klippel. His recent scan showed market interval progression of the right pleural effusion with slight progression of the left lower lobe pulmonary nodules. The patient was started on treatment with Tarceva 150 mg by mouth daily. He is now status post approximately 1 month of therapy. Patient was discussed with an also seen by Dr. Julien Nordmann. He'll continue on Tarceva 150 mg by mouth daily. He'll followup one month for another symptom management visit. He is encouraged to continue the drainage of the  Pleurx catheter as previously instructed. Should he continue to have low volume of fluid removal, he may need to be referred him back to interventional radiology to have the Pleurx catheter removed.   He was advised to call immediately if he has any concerning symptoms in the interval. The patient voices understanding of current disease status and treatment options and is in agreement with the current care plan.  All questions were answered. The patient knows to call the clinic with any problems, questions or concerns. We can certainly see the patient much sooner if necessary.  Carlton Adam, PA-C  ADDENDUM: Hematology/Oncology Attending:  I had a face to face encounter with the patient. I recommended his care plan. This is a very pleasant 58 years old white male with metastatic non-small cell lung cancer, adenocarcinoma currently undergoing treatment with oral Tarceva 150 mg by mouth daily and tolerating it well with very mild skin rash and occasional diarrhea. The  patient has a Pleurx catheter placed for drainage of right pleural effusion and he is currently being less than 50 cc every few days. He denied having any other significant complaints. I recommended for him to continue his treatment with Tarceva with the same dose. He would come back for follow up visit in one month for reevaluation. He was advised to call immediately if he has any concerning symptoms in the interval.  Disclaimer: This note was dictated with voice recognition software. Similar sounding words can inadvertently be transcribed and may not be corrected upon review. Curt Bears, MD 08/11/2013

## 2013-08-06 NOTE — Patient Instructions (Signed)
Continue taking Tarceva 150 mg by mouth daily Continue to drain your Pleurx catheter in place once twice a week depending on the amount of volume removed Followup in one month

## 2013-09-02 ENCOUNTER — Telehealth: Payer: Self-pay | Admitting: Internal Medicine

## 2013-09-02 ENCOUNTER — Other Ambulatory Visit (HOSPITAL_BASED_OUTPATIENT_CLINIC_OR_DEPARTMENT_OTHER): Payer: Medicare Other

## 2013-09-02 ENCOUNTER — Encounter: Payer: Self-pay | Admitting: Internal Medicine

## 2013-09-02 ENCOUNTER — Ambulatory Visit (HOSPITAL_BASED_OUTPATIENT_CLINIC_OR_DEPARTMENT_OTHER): Payer: Medicare Other | Admitting: Internal Medicine

## 2013-09-02 VITALS — BP 163/86 | HR 111 | Temp 98.1°F | Resp 18 | Ht 70.0 in | Wt 149.8 lb

## 2013-09-02 DIAGNOSIS — F17200 Nicotine dependence, unspecified, uncomplicated: Secondary | ICD-10-CM

## 2013-09-02 DIAGNOSIS — R21 Rash and other nonspecific skin eruption: Secondary | ICD-10-CM

## 2013-09-02 DIAGNOSIS — C3430 Malignant neoplasm of lower lobe, unspecified bronchus or lung: Secondary | ICD-10-CM

## 2013-09-02 DIAGNOSIS — C3490 Malignant neoplasm of unspecified part of unspecified bronchus or lung: Secondary | ICD-10-CM

## 2013-09-02 DIAGNOSIS — B370 Candidal stomatitis: Secondary | ICD-10-CM

## 2013-09-02 DIAGNOSIS — C3411 Malignant neoplasm of upper lobe, right bronchus or lung: Secondary | ICD-10-CM

## 2013-09-02 DIAGNOSIS — J90 Pleural effusion, not elsewhere classified: Secondary | ICD-10-CM

## 2013-09-02 LAB — COMPREHENSIVE METABOLIC PANEL (CC13)
ALT: 8 U/L (ref 0–55)
ANION GAP: 8 meq/L (ref 3–11)
AST: 17 U/L (ref 5–34)
Albumin: 2.8 g/dL — ABNORMAL LOW (ref 3.5–5.0)
Alkaline Phosphatase: 64 U/L (ref 40–150)
BUN: 16 mg/dL (ref 7.0–26.0)
CO2: 28 mEq/L (ref 22–29)
CREATININE: 1.5 mg/dL — AB (ref 0.7–1.3)
Calcium: 9 mg/dL (ref 8.4–10.4)
Chloride: 107 mEq/L (ref 98–109)
Glucose: 122 mg/dl (ref 70–140)
Potassium: 3.6 mEq/L (ref 3.5–5.1)
Sodium: 142 mEq/L (ref 136–145)
Total Bilirubin: 0.38 mg/dL (ref 0.20–1.20)
Total Protein: 5.8 g/dL — ABNORMAL LOW (ref 6.4–8.3)

## 2013-09-02 LAB — CBC WITH DIFFERENTIAL/PLATELET
BASO%: 0.4 % (ref 0.0–2.0)
BASOS ABS: 0 10*3/uL (ref 0.0–0.1)
EOS ABS: 0.4 10*3/uL (ref 0.0–0.5)
EOS%: 4.1 % (ref 0.0–7.0)
HCT: 42.7 % (ref 38.4–49.9)
HEMOGLOBIN: 13.8 g/dL (ref 13.0–17.1)
LYMPH#: 1 10*3/uL (ref 0.9–3.3)
LYMPH%: 11 % — ABNORMAL LOW (ref 14.0–49.0)
MCH: 27.8 pg (ref 27.2–33.4)
MCHC: 32.3 g/dL (ref 32.0–36.0)
MCV: 85.9 fL (ref 79.3–98.0)
MONO#: 0.8 10*3/uL (ref 0.1–0.9)
MONO%: 8.5 % (ref 0.0–14.0)
NEUT#: 7.1 10*3/uL — ABNORMAL HIGH (ref 1.5–6.5)
NEUT%: 76 % — AB (ref 39.0–75.0)
Platelets: 256 10*3/uL (ref 140–400)
RBC: 4.97 10*6/uL (ref 4.20–5.82)
RDW: 13.4 % (ref 11.0–14.6)
WBC: 9.4 10*3/uL (ref 4.0–10.3)

## 2013-09-02 MED ORDER — VALACYCLOVIR HCL 500 MG PO TABS: 500.0000 mg | ORAL_TABLET | Freq: Two times a day (BID) | ORAL | Status: DC

## 2013-09-02 MED ORDER — FLUCONAZOLE 100 MG PO TABS: 100.0000 mg | ORAL_TABLET | Freq: Every day | ORAL | Status: DC

## 2013-09-02 NOTE — Patient Instructions (Signed)
Smoking Cessation, Tips for Success If you are ready to quit smoking, congratulations! You have chosen to help yourself be healthier. Cigarettes bring nicotine, tar, carbon monoxide, and other irritants into your body. Your lungs, heart, and blood vessels will be able to work better without these poisons. There are many different ways to quit smoking. Nicotine gum, nicotine patches, a nicotine inhaler, or nicotine nasal spray can help with physical craving. Hypnosis, support groups, and medicines help break the habit of smoking. WHAT THINGS CAN I DO TO MAKE QUITTING EASIER?  Here are some tips to help you quit for good:  Pick a date when you will quit smoking completely. Tell all of your friends and family about your plan to quit on that date.  Do not try to slowly cut down on the number of cigarettes you are smoking. Pick a quit date and quit smoking completely starting on that day.  Throw away all cigarettes.   Clean and remove all ashtrays from your home, work, and car.   On a card, write down your reasons for quitting. Carry the card with you and read it when you get the urge to smoke.   Cleanse your body of nicotine. Drink enough water and fluids to keep your urine clear or pale yellow. Do this after quitting to flush the nicotine from your body.   Learn to predict your moods. Do not let a bad situation be your excuse to have a cigarette. Some situations in your life might tempt you into wanting a cigarette.   Never have "just one" cigarette. It leads to wanting another and another. Remind yourself of your decision to quit.   Change habits associated with smoking. If you smoked while driving or when feeling stressed, try other activities to replace smoking. Stand up when drinking your coffee. Brush your teeth after eating. Sit in a different chair when you read the paper. Avoid alcohol while trying to quit, and try to drink fewer caffeinated beverages. Alcohol and caffeine may urge  you to smoke.   Avoid foods and drinks that can trigger a desire to smoke, such as sugary or spicy foods and alcohol.   Ask people who smoke not to smoke around you.   Have something planned to do right after eating or having a cup of coffee. For example, plan to take a walk or exercise.   Try a relaxation exercise to calm you down and decrease your stress. Remember, you may be tense and nervous for the first 2 weeks after you quit, but this will pass.   Find new activities to keep your hands busy. Play with a pen, coin, or rubber band. Doodle or draw things on paper.   Brush your teeth right after eating. This will help cut down on the craving for the taste of tobacco after meals. You can also try mouthwash.   Use oral substitutes in place of cigarettes. Try using lemon drops, carrots, cinnamon sticks, or chewing gum. Keep them handy so they are available when you have the urge to smoke.   When you have the urge to smoke, try deep breathing.   Designate your home as a nonsmoking area.   If you are a heavy smoker, ask your health care provider about a prescription for nicotine chewing gum. It can ease your withdrawal from nicotine.   Reward yourself. Set aside the cigarette money you save and buy yourself something nice.   Look for support from others. Join a support group or   smoking cessation program. Ask someone at home or at work to help you with your plan to quit smoking.   Always ask yourself, "Do I need this cigarette or is this just a reflex?" Tell yourself, "Today, I choose not to smoke," or "I do not want to smoke." You are reminding yourself of your decision to quit.  Do not replace cigarette smoking with electronic cigarettes (commonly called e-cigarettes). The safety of e-cigarettes is unknown, and some may contain harmful chemicals.  If you relapse, do not give up! Plan ahead and think about what you will do the next time you get the urge to smoke.  HOW WILL  I FEEL WHEN I QUIT SMOKING? You may have symptoms of withdrawal because your body is used to nicotine (the addictive substance in cigarettes). You may crave cigarettes, be irritable, feel very hungry, cough often, get headaches, or have difficulty concentrating. The withdrawal symptoms are only temporary. They are strongest when you first quit but will go away within 10-14 days. When withdrawal symptoms occur, stay in control. Think about your reasons for quitting. Remind yourself that these are signs that your body is healing and getting used to being without cigarettes. Remember that withdrawal symptoms are easier to treat than the major diseases that smoking can cause.  Even after the withdrawal is over, expect periodic urges to smoke. However, these cravings are generally short lived and will go away whether you smoke or not. Do not smoke!  WHAT RESOURCES ARE AVAILABLE TO HELP ME QUIT SMOKING? Your health care provider can direct you to community resources or hospitals for support, which may include:  Group support.  Education.  Hypnosis.  Therapy. Document Released: 11/25/2003 Document Revised: 12/17/2012 Document Reviewed: 08/14/2012 [facility] Patient Information 2015 [place], [place]. This information is not intended to replace advice given to you by your health care provider. Make sure you discuss any questions you have with your health care provider.

## 2013-09-02 NOTE — Telephone Encounter (Signed)
gv and printed appt sched and avs for pt for July....sed gv barium

## 2013-09-02 NOTE — Telephone Encounter (Signed)
radiology will call pt with IR appt

## 2013-09-02 NOTE — Progress Notes (Signed)
[place] [phone]   [phone]  OFFICE PROGRESS NOTE  Woody Seller, MD [address] [place]  [address]  PRINCIPAL DIAGNOSIS AND STAGE:  1. Metastatic non-small cell lung cancer, adenocarcinoma diagnosed in June of 2009. 2. Renal insufficiency followed by Dr. Posey Pronto. 3. History of seizure followed by Dr. Doy Mince.  PRIOR THERAPY:  1. Status post 6 cycles of systemic chemotherapy with carboplatin, Alimta, and Avastin. Last dose was given December 25, 2007, and the patient had partial response to this treatment.  2. Status post maintenance chemotherapy with Alimta 500 mg per meter squared and Avastin 15 mg/kg given every 3 weeks, status post 23 cycles. Last dose was given May 23, 2009. The patient had stable disease but the treatment was discontinued secondary to toxicity and renal insufficiency. 3. Right Pleurx catheter placement by interventional radiology on 07/03/2013  CURRENT THERAPY: Tarceva 150 mg by mouth daily, started 07/04/2013.  CHEMOTHERAPY INTENT: Palliative  CURRENT # OF CHEMOTHERAPY CYCLES: 3  CURRENT ANTIEMETICS: N/A  CURRENT SMOKING STATUS: Occasional smoker, strongley advised to quit smoking.  ORAL CHEMOTHERAPY AND CONSENT: None  CURRENT BISPHOSPHONATES USE: None  PAIN MANAGEMENT:1/10  NARCOTICS INDUCED CONSTIPATION: N/A  LIVING WILL AND CODE STATUS: Full code.   INTERVAL HISTORY: [name] [name] 58 y.o. male returns to the clinic today for followup visit accompanied by his girlfriend. The patient is currently on treatment with Tarceva 150 mg by mouth daily for the last 2 months and tolerating his treatment fairly well except for mild skin rash on the face. He is complaining of pain on the right side of the chest at the right Pleurx catheter area and he developed a rash in that area suspicious for herpes zoster.  He is draining less than 100 ml of pleural fluid from the Pleuryx catheters twice a week. He  also complains of lack of taste and development of oral thrush. He denied having any significant cough or hemoptysis. The patient denied having any fever or chills. He denied having any significant nausea or vomiting. He has no weight loss or night sweats.   MEDICAL HISTORY: Past Medical History  Diagnosis Date  . Hypertension   . lung ca dx'd 05/2007    chemo comp 12/2009  . 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 Hx antineoplastic chemo 2011    ALLERGIES:  has No Known Allergies.  MEDICATIONS:  Current Outpatient Prescriptions  Medication Sig Dispense Refill  . ALPRAZolam (XANAX) 1 MG tablet Take 1 mg by mouth 3 (three) times daily as needed for anxiety.       Marland Kitchen amLODipine (NORVASC) 10 MG tablet Take 10 mg by mouth every morning.      . erlotinib (TARCEVA) 150 MG tablet Take 1 tablet (150 mg total) by mouth daily. Take on an empty stomach 1 hour before meals or 2 hours after.  30 tablet  2  . fluticasone (FLONASE) 50 MCG/ACT nasal spray       . folic acid (FOLVITE) 371 MCG tablet Take 400 mcg by mouth daily.      . metoprolol succinate (TOPROL-XL) 50 MG 24 hr tablet Take 50 mg by mouth daily with lunch. Take with or immediately following a meal.      . OxyCODONE (OXYCONTIN) 40 mg T12A 12 hr tablet Take 40 mg by mouth 3 (three) times daily.      . Oxycodone HCl 20 MG TABS Take  20 mg by mouth 3 (three) times daily as needed (pain).      . promethazine (PHENERGAN) 25 MG suppository Place 25 mg rectally every 6 (six) hours as needed for nausea.       . sertraline (ZOLOFT) 100 MG tablet Take 100 mg by mouth daily with lunch.      . zolpidem (AMBIEN) 10 MG tablet Take 10 mg by mouth at bedtime as needed for sleep.        No current facility-administered medications for this visit.    REVIEW OF SYSTEMS:  Constitutional: positive for fatigue Eyes: negative Ears, nose, mouth, throat, and face: negative Respiratory: positive for dyspnea on exertion and pleurisy/chest  pain Cardiovascular: negative Gastrointestinal: negative Genitourinary:negative Integument/breast: negative Hematologic/lymphatic: negative Musculoskeletal:negative Neurological: negative Behavioral/Psych: negative Endocrine: negative Allergic/Immunologic: negative   PHYSICAL EXAMINATION: General appearance: alert, cooperative and no distress Head: Normocephalic, without obvious abnormality, atraumatic Neck: no adenopathy Lymph nodes: Cervical, supraclavicular, and axillary nodes normal. Resp: clear to auscultation bilaterally Back: symmetric, no curvature. ROM normal. No CVA tenderness. Cardio: regular rate and rhythm, S1, S2 normal, no murmur, click, rub or gallop GI: soft, non-tender; bowel sounds normal; no masses,  no organomegaly Extremities: extremities normal, atraumatic, no cyanosis or edema Neurologic: Alert and oriented X 3, normal strength and tone. Normal symmetric reflexes. Normal coordination and gait Skin exam showed vesicular lesions at the right side of the chest dose to the Port-A-Cath insertion site suspicious for shingles.  ECOG PERFORMANCE STATUS: 1 - Symptomatic but completely ambulatory  Blood pressure 163/86, pulse 111, temperature 98.1 F (36.7 C), temperature source Oral, resp. rate 18, height 5\' 10"  (1.778 m), weight 149 lb 12.8 oz (67.949 kg), SpO2 93.00%.  LABORATORY DATA: Lab Results  Component Value Date   WBC 9.4 09/02/2013   HGB 13.8 09/02/2013   HCT 42.7 09/02/2013   MCV 85.9 09/02/2013   PLT 256 09/02/2013      Chemistry      Component Value Date/Time   NA 141 08/06/2013 0844   NA 143 12/21/2011 1429   NA 144 07/18/2011 1546   K 3.7 08/06/2013 0844   K 4.1 12/21/2011 1429   K 4.5 07/18/2011 1546   CL 110* 02/20/2012 1516   CL 109 12/21/2011 1429   CL 101 07/18/2011 1546   CO2 24 08/06/2013 0844   CO2 25 12/21/2011 1429   CO2 28 07/18/2011 1546   BUN 14.8 08/06/2013 0844   BUN 16 12/21/2011 1429   BUN 20 07/18/2011 1546   CREATININE 1.4*  08/06/2013 0844   CREATININE 1.70* 12/21/2011 1429   CREATININE 1.8* 07/18/2011 1546      Component Value Date/Time   CALCIUM 9.1 08/06/2013 0844   CALCIUM 9.2 12/21/2011 1429   CALCIUM 8.8 07/18/2011 1546   ALKPHOS 73 08/06/2013 0844   ALKPHOS 65 12/21/2011 1429   ALKPHOS 83 07/18/2011 1546   AST 15 08/06/2013 0844   AST 12 12/21/2011 1429   AST 19 07/18/2011 1546   ALT 8 08/06/2013 0844   ALT <8 12/21/2011 1429   ALT 12 07/18/2011 1546   BILITOT 0.28 08/06/2013 0844   BILITOT 0.3 12/21/2011 1429   BILITOT 0.50 07/18/2011 1546       RADIOGRAPHIC STUDIES:  ASSESSMENT AND PLAN:  1) metastatic non-small cell lung cancer, adenocarcinoma: This is a very pleasant 58 years old white male with history of metastatic non-small cell lung cancer, adenocarcinoma status post systemic chemotherapy with carboplatin, Alimta and Avastin followed by maintenance Alimta and  Avastin but has been observation since March of 2011, presented recently by stereotactic radiotherapy to enlarging right lung nodules under the care of Dr. Tammi Klippel. His recent scan showed market interval progression of the right pleural effusion with slight progression of the left lower lobe pulmonary nodules. The patient was started on treatment with Tarceva 150 mg by mouth daily, status post 2 months of treatment. He is tolerating his treatment well except for mild skin rash. I recommended for him to continue his current treatment with Tarceva.  I would see him back for followup visit in one month for reevaluation after repeating CT scan of the chest, abdomen and pelvis without contrast for restaging of his disease.  2) recurrent right pleural effusion: The patient has minimal drainage from the Pleuryx catheter and I recommended for him to see interventional radiology for removal of the Pleuryx cath. 3) oral thrush: I will start the patient on Diflucan 100 mg by mouth daily for the next 10 days. 4) herpes zoster of the right anterior chest: I  will start the patient on Valtrex 500 mg by mouth twice a day for 10 days.  He was advised to call immediately if he has any concerning symptoms in the interval. The patient voices understanding of current disease status and treatment options and is in agreement with the current care plan.  All questions were answered. The patient knows to call the clinic with any problems, questions or concerns. We can certainly see the patient much sooner if necessary.  Disclaimer: This note was dictated with voice recognition software. Similar sounding words can inadvertently be transcribed and may not be corrected upon review.

## 2013-09-04 ENCOUNTER — Other Ambulatory Visit: Payer: Self-pay | Admitting: Radiology

## 2013-09-07 ENCOUNTER — Other Ambulatory Visit: Payer: Self-pay | Admitting: Radiology

## 2013-09-08 ENCOUNTER — Ambulatory Visit (HOSPITAL_COMMUNITY)
Admission: RE | Admit: 2013-09-08 | Discharge: 2013-09-08 | Disposition: A | Discharge status: Home / Self Care | Payer: Medicare Other | Source: Ambulatory Visit | Attending: Internal Medicine | Admitting: Internal Medicine

## 2013-09-08 ENCOUNTER — Other Ambulatory Visit: Payer: Self-pay | Admitting: Internal Medicine

## 2013-09-08 ENCOUNTER — Encounter (HOSPITAL_COMMUNITY): Payer: Self-pay

## 2013-09-08 DIAGNOSIS — Z5309 Procedure and treatment not carried out because of other contraindication: Secondary | ICD-10-CM | POA: Insufficient documentation

## 2013-09-08 DIAGNOSIS — J90 Pleural effusion, not elsewhere classified: Secondary | ICD-10-CM | POA: Diagnosis not present

## 2013-09-08 DIAGNOSIS — B029 Zoster without complications: Secondary | ICD-10-CM | POA: Insufficient documentation

## 2013-09-08 DIAGNOSIS — I10 Essential (primary) hypertension: Secondary | ICD-10-CM | POA: Diagnosis not present

## 2013-09-08 DIAGNOSIS — Z79899 Other long term (current) drug therapy: Secondary | ICD-10-CM | POA: Insufficient documentation

## 2013-09-08 DIAGNOSIS — Z9221 Personal history of antineoplastic chemotherapy: Secondary | ICD-10-CM | POA: Insufficient documentation

## 2013-09-08 DIAGNOSIS — C3411 Malignant neoplasm of upper lobe, right bronchus or lung: Secondary | ICD-10-CM

## 2013-09-08 DIAGNOSIS — Z4682 Encounter for fitting and adjustment of non-vascular catheter: Secondary | ICD-10-CM | POA: Insufficient documentation

## 2013-09-08 DIAGNOSIS — C3490 Malignant neoplasm of unspecified part of unspecified bronchus or lung: Secondary | ICD-10-CM | POA: Insufficient documentation

## 2013-09-08 DIAGNOSIS — F17200 Nicotine dependence, unspecified, uncomplicated: Secondary | ICD-10-CM | POA: Insufficient documentation

## 2013-09-08 LAB — CBC
HEMATOCRIT: 45.1 % (ref 39.0–52.0)
Hemoglobin: 15 g/dL (ref 13.0–17.0)
MCH: 28.3 pg (ref 26.0–34.0)
MCHC: 33.3 g/dL (ref 30.0–36.0)
MCV: 85.1 fL (ref 78.0–100.0)
PLATELETS: 336 10*3/uL (ref 150–400)
RBC: 5.3 MIL/uL (ref 4.22–5.81)
RDW: 13.5 % (ref 11.5–15.5)
WBC: 11.2 10*3/uL — ABNORMAL HIGH (ref 4.0–10.5)

## 2013-09-08 LAB — PROTIME-INR
INR: 1.01 (ref 0.00–1.49)
Prothrombin Time: 13.3 seconds (ref 11.6–15.2)

## 2013-09-08 MED ORDER — FENTANYL CITRATE 0.05 MG/ML IJ SOLN
INTRAMUSCULAR | Status: AC | PRN
  Administered 2013-09-08: 100 ug via INTRAVENOUS

## 2013-09-08 MED ORDER — CEFAZOLIN SODIUM-DEXTROSE 2-3 GM-% IV SOLR
INTRAVENOUS | Status: AC
  Filled 2013-09-08: qty 50

## 2013-09-08 MED ORDER — SODIUM CHLORIDE 0.9 % IV SOLN
INTRAVENOUS | Status: DC
  Administered 2013-09-08: 12:00:00 via INTRAVENOUS

## 2013-09-08 MED ORDER — CEFAZOLIN SODIUM-DEXTROSE 2-3 GM-% IV SOLR
2.0000 g | Freq: Once | INTRAVENOUS | Status: AC
  Administered 2013-09-08: 2 g via INTRAVENOUS

## 2013-09-08 MED ORDER — MIDAZOLAM HCL 2 MG/2ML IJ SOLN
INTRAMUSCULAR | Status: AC
  Filled 2013-09-08: qty 4

## 2013-09-08 MED ORDER — FENTANYL CITRATE 0.05 MG/ML IJ SOLN
INTRAMUSCULAR | Status: AC
  Filled 2013-09-08: qty 4

## 2013-09-08 MED ORDER — MIDAZOLAM HCL 2 MG/2ML IJ SOLN
INTRAMUSCULAR | Status: AC | PRN
  Administered 2013-09-08: 1 mg via INTRAVENOUS
  Administered 2013-09-08: 2 mg via INTRAVENOUS

## 2013-09-08 MED ORDER — LIDOCAINE HCL 1 % IJ SOLN
INTRAMUSCULAR | Status: AC
  Filled 2013-09-08: qty 20

## 2013-09-08 NOTE — Discharge Instructions (Signed)
Conscious Sedation, Adult, Care After Refer to this sheet in the next few weeks. These instructions provide you with information on caring for yourself after your procedure. Your health care provider may also give you more specific instructions. Your treatment has been planned according to current medical practices, but problems sometimes occur. Call your health care provider if you have any problems or questions after your procedure. WHAT TO EXPECT AFTER THE PROCEDURE  After your procedure:  You may feel sleepy, clumsy, and have poor balance for several hours.  Vomiting may occur if you eat too soon after the procedure. HOME CARE INSTRUCTIONS  Do not participate in any activities where you could become injured for at least 24 hours. Do not:  Drive.  Swim.  Ride a bicycle.  Operate heavy machinery.  Cook.  Use power tools.  Climb ladders.  Work from a high place.  Do not make important decisions or sign legal documents until you are improved.  If you vomit, drink water, juice, or soup when you can drink without vomiting. Make sure you have little or no nausea before eating solid foods.  Only take over-the-counter or prescription medicines for pain, discomfort, or fever as directed by your health care provider.  Make sure you and your family fully understand everything about the medicines given to you, including what side effects may occur.  You should not drink alcohol, take sleeping pills, or take medicines that cause drowsiness for at least 24 hours.  If you smoke, do not smoke without supervision.  If you are feeling better, you may resume normal activities 24 hours after you were sedated.  Keep all appointments with your health care provider. SEEK MEDICAL CARE IF:  Your skin is pale or bluish in color.  You continue to feel nauseous or vomit.  Your pain is getting worse and is not helped by medicine.  You have bleeding or swelling.  You are still sleepy or  feeling clumsy after 24 hours. SEEK IMMEDIATE MEDICAL CARE IF:  You develop a rash.  You have difficulty breathing.  You develop any type of allergic problem.  You have a fever. MAKE SURE YOU:  Understand these instructions.  Will watch your condition.  Will get help right away if you are not doing well or get worse. Document Released: 12/17/2012 Document Reviewed: 12/17/2012 [facility] Patient Information 2015 [place], [place]. This information is not intended to replace advice given to you by your health care provider. Make sure you discuss any questions you have with your health care provider.

## 2013-09-08 NOTE — H&P (Signed)
[name] [name] is an 58 y.o. male.   Chief Complaint: "I'm getting my chest catheter out" HPI: Patient with history of metastatic [place] lung carcinoma and previously placed right pleurx catheter for recurrent effusion. He has had minimal drainage from catheter and also has appearance of herpes zoster along rt anterior chest region causing discomfort near catheter. He presents today for right pleurx removal.  Past Medical History  Diagnosis Date  . Hypertension   . lung ca dx'd 05/2007    chemo comp 12/2009  . 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 Hx antineoplastic chemo 2011    History reviewed. No pertinent past surgical history.  Family History  Problem Relation Age of Onset  . Cancer Brother    Social History:  reports that he has been smoking Cigarettes.  He has been smoking about 0.00 packs per day. He has never used smokeless tobacco. He reports that he does not drink alcohol or use illicit drugs.  Allergies: No Known Allergies  Current outpatient prescriptions:ALPRAZolam (XANAX) 1 MG tablet, Take 1 mg by mouth 3 (three) times daily as needed for anxiety. , Disp: , Rfl: ;  amLODipine (NORVASC) 10 MG tablet, Take 10 mg by mouth at bedtime. , Disp: , Rfl: ;  ENSURE (ENSURE), Take 237 mLs by mouth 2 (two) times daily between meals., Disp: , Rfl:  erlotinib (TARCEVA) 150 MG tablet, Take 1 tablet (150 mg total) by mouth daily. Take on an empty stomach 1 hour before meals or 2 hours after., Disp: 30 tablet, Rfl: 2;  fluconazole (DIFLUCAN) 100 MG tablet, Take 1 tablet (100 mg total) by mouth daily., Disp: 10 tablet, Rfl: 0;  fluticasone (FLONASE) 50 MCG/ACT nasal spray, Place 1 spray into both nostrils daily. , Disp: , Rfl:  folic acid (FOLVITE) 161 MCG tablet, Take 400 mcg by mouth daily., Disp: , Rfl: ;  gabapentin (NEURONTIN) 300 MG capsule, Take 300 mg by mouth 2 (two) times daily., Disp: , Rfl: ;  ibuprofen (ADVIL,MOTRIN) 200 MG tablet, Take 600 mg by mouth every 6 (six)  hours as needed for moderate pain., Disp: , Rfl: ;  metoprolol succinate (TOPROL-XL) 50 MG 24 hr tablet, Take 50 mg by mouth at bedtime. Take with or immediately following a meal., Disp: , Rfl:  Multiple Vitamin (MULTIVITAMIN WITH MINERALS) TABS tablet, Take 1 tablet by mouth daily., Disp: , Rfl: ;  OxyCODONE (OXYCONTIN) 40 mg T12A 12 hr tablet, Take 40 mg by mouth 3 (three) times daily., Disp: , Rfl: ;  Oxycodone HCl 20 MG TABS, Take 20 mg by mouth 3 (three) times daily as needed (pain)., Disp: , Rfl: ;  polyvinyl alcohol (LIQUIFILM TEARS) 1.4 % ophthalmic solution, Place 1 drop into both eyes as needed for dry eyes., Disp: , Rfl:  promethazine (PHENERGAN) 25 MG suppository, Place 25 mg rectally every 6 (six) hours as needed for nausea. , Disp: , Rfl: ;  sertraline (ZOLOFT) 100 MG tablet, Take 100 mg by mouth at bedtime. , Disp: , Rfl: ;  valACYclovir (VALTREX) 500 MG tablet, Take 1 tablet (500 mg total) by mouth 2 (two) times daily., Disp: 20 tablet, Rfl: 0;  zolpidem (AMBIEN) 10 MG tablet, Take 10 mg by mouth at bedtime as needed for sleep. , Disp: , Rfl:  Current facility-administered medications:0.9 %  sodium chloride infusion, , Intravenous, Continuous, Koreen Lorri Frederick, PA-C, Last Rate: 10 mL/hr at 09/08/13 1155;  ceFAZolin (ANCEF) IVPB 2 g/50 mL premix, 2  g, Intravenous, Once, Hedy Jacob, PA-C   Results for orders placed during the hospital encounter of 09/08/13 (from the past 48 hour(s))  CBC     Status: Abnormal   Collection Time    09/08/13 11:37 AM      Result Value Ref Range   WBC 11.2 (*) 4.0 - 10.5 K/uL   RBC 5.30  4.22 - 5.81 MIL/uL   Hemoglobin 15.0  13.0 - 17.0 g/dL   HCT 45.1  39.0 - 52.0 %   MCV 85.1  78.0 - 100.0 fL   MCH 28.3  26.0 - 34.0 pg   MCHC 33.3  30.0 - 36.0 g/dL   RDW 13.5  11.5 - 15.5 %   Platelets 336  150 - 400 K/uL  PROTIME-INR     Status: None   Collection Time    09/08/13 11:37 AM      Result Value Ref Range   Prothrombin Time 13.3  11.6 - 15.2 seconds    INR 1.01  0.00 - 1.49   No results found.  Review of Systems  Constitutional: Negative for fever.  Respiratory: Negative for hemoptysis.        Occ dyspnea and dyspnea  Cardiovascular:       Rt ant /lat chest discomfort secondary to presumed shingles  Gastrointestinal: Negative for nausea, vomiting and abdominal pain.  Musculoskeletal: Negative for back pain.  Neurological: Negative for headaches.  Endo/Heme/Allergies: Does not bruise/bleed easily.    Blood pressure 135/93, pulse 118, temperature 97.1 F (36.2 C), temperature source Oral, resp. rate 18, SpO2 94.00%. Physical Exam  Constitutional: He is oriented to person, place, and time.  Thin WM in NAD  Cardiovascular:  Tachy but regular rhythm  Respiratory: Effort normal.  Dim BS bases; rt chest wall PAC in place; rt lower chest pleurx intact; few small vesicles on rt ant/lat chest wall, painful touch and along dermatome  GI: Soft. Bowel sounds are normal.  Musculoskeletal: Normal range of motion. He exhibits no edema.  Neurological: He is alert and oriented to person, place, and time.     Assessment/Plan Patient with history of metastatic [place] lung carcinoma and previously placed right pleurx catheter for recurrent effusion. He has had minimal drainage from catheter and also has appearance of herpes zoster along rt anterior chest region causing discomfort near catheter. He presents today for right pleurx removal. Details/risks of procedure d/w pt with his understanding and consent.  ALLRED,D KEVIN 09/08/2013, 2:18 PM

## 2013-09-08 NOTE — Procedures (Signed)
Procedure:  Fluoro and aspiration of pleural drain Findings:  Right pleural drain yielded fibrinous material and 320 mL of pleural fluid.  Post evacuation fluoro shows exvacuo air in pleural space. Better not to remove drain at this time.  New retention suture and dressing applied.

## 2013-09-08 NOTE — H&P (Signed)
Agree.  For tunneled pleural drain removal today.

## 2013-09-22 ENCOUNTER — Telehealth: Payer: Self-pay | Admitting: Medical Oncology

## 2013-09-22 NOTE — Telephone Encounter (Signed)
Left message for wife to call back with information about needed supplies for pleurix drainage.

## 2013-09-23 ENCOUNTER — Encounter (HOSPITAL_COMMUNITY): Payer: Self-pay

## 2013-09-23 ENCOUNTER — Other Ambulatory Visit (HOSPITAL_BASED_OUTPATIENT_CLINIC_OR_DEPARTMENT_OTHER): Payer: Medicare Other

## 2013-09-23 ENCOUNTER — Other Ambulatory Visit: Payer: Medicare Other

## 2013-09-23 ENCOUNTER — Ambulatory Visit (HOSPITAL_COMMUNITY)
Admission: RE | Admit: 2013-09-23 | Discharge: 2013-09-23 | Disposition: A | Discharge status: Home / Self Care | Payer: Medicare Other | Source: Ambulatory Visit | Attending: Internal Medicine | Admitting: Internal Medicine

## 2013-09-23 DIAGNOSIS — I700 Atherosclerosis of aorta: Secondary | ICD-10-CM

## 2013-09-23 DIAGNOSIS — R091 Pleurisy: Secondary | ICD-10-CM | POA: Insufficient documentation

## 2013-09-23 DIAGNOSIS — K8020 Calculus of gallbladder without cholecystitis without obstruction: Secondary | ICD-10-CM

## 2013-09-23 DIAGNOSIS — R188 Other ascites: Secondary | ICD-10-CM

## 2013-09-23 DIAGNOSIS — J90 Pleural effusion, not elsewhere classified: Secondary | ICD-10-CM | POA: Insufficient documentation

## 2013-09-23 DIAGNOSIS — R911 Solitary pulmonary nodule: Secondary | ICD-10-CM

## 2013-09-23 DIAGNOSIS — R05 Cough: Secondary | ICD-10-CM | POA: Diagnosis not present

## 2013-09-23 DIAGNOSIS — N269 Renal sclerosis, unspecified: Secondary | ICD-10-CM

## 2013-09-23 DIAGNOSIS — R059 Cough, unspecified: Secondary | ICD-10-CM | POA: Diagnosis not present

## 2013-09-23 DIAGNOSIS — I129 Hypertensive chronic kidney disease with stage 1 through stage 4 chronic kidney disease, or unspecified chronic kidney disease: Secondary | ICD-10-CM | POA: Insufficient documentation

## 2013-09-23 DIAGNOSIS — J9819 Other pulmonary collapse: Secondary | ICD-10-CM | POA: Insufficient documentation

## 2013-09-23 DIAGNOSIS — I2510 Atherosclerotic heart disease of native coronary artery without angina pectoris: Secondary | ICD-10-CM

## 2013-09-23 DIAGNOSIS — I771 Stricture of artery: Secondary | ICD-10-CM | POA: Insufficient documentation

## 2013-09-23 DIAGNOSIS — J910 Malignant pleural effusion: Secondary | ICD-10-CM | POA: Diagnosis not present

## 2013-09-23 DIAGNOSIS — Z9221 Personal history of antineoplastic chemotherapy: Secondary | ICD-10-CM | POA: Insufficient documentation

## 2013-09-23 DIAGNOSIS — C3430 Malignant neoplasm of lower lobe, unspecified bronchus or lung: Secondary | ICD-10-CM

## 2013-09-23 DIAGNOSIS — C3410 Malignant neoplasm of upper lobe, unspecified bronchus or lung: Secondary | ICD-10-CM | POA: Insufficient documentation

## 2013-09-23 DIAGNOSIS — C3411 Malignant neoplasm of upper lobe, right bronchus or lung: Secondary | ICD-10-CM

## 2013-09-23 LAB — CBC WITH DIFFERENTIAL/PLATELET
BASO%: 0.8 % (ref 0.0–2.0)
Basophils Absolute: 0.1 10*3/uL (ref 0.0–0.1)
EOS%: 4.7 % (ref 0.0–7.0)
Eosinophils Absolute: 0.5 10*3/uL (ref 0.0–0.5)
HCT: 43.8 % (ref 38.4–49.9)
HGB: 13.8 g/dL (ref 13.0–17.1)
LYMPH#: 0.8 10*3/uL — AB (ref 0.9–3.3)
LYMPH%: 7.6 % — ABNORMAL LOW (ref 14.0–49.0)
MCH: 27.3 pg (ref 27.2–33.4)
MCHC: 31.5 g/dL — AB (ref 32.0–36.0)
MCV: 86.5 fL (ref 79.3–98.0)
MONO#: 0.9 10*3/uL (ref 0.1–0.9)
MONO%: 9 % (ref 0.0–14.0)
NEUT#: 7.9 10*3/uL — ABNORMAL HIGH (ref 1.5–6.5)
NEUT%: 77.9 % — ABNORMAL HIGH (ref 39.0–75.0)
Platelets: 337 10*3/uL (ref 140–400)
RBC: 5.06 10*6/uL (ref 4.20–5.82)
RDW: 14.9 % — ABNORMAL HIGH (ref 11.0–14.6)
WBC: 10.1 10*3/uL (ref 4.0–10.3)

## 2013-09-23 LAB — COMPREHENSIVE METABOLIC PANEL (CC13)
ALT: 10 U/L (ref 0–55)
ANION GAP: 9 meq/L (ref 3–11)
AST: 21 U/L (ref 5–34)
Albumin: 2.6 g/dL — ABNORMAL LOW (ref 3.5–5.0)
Alkaline Phosphatase: 67 U/L (ref 40–150)
BUN: 18.8 mg/dL (ref 7.0–26.0)
CALCIUM: 9.1 mg/dL (ref 8.4–10.4)
CHLORIDE: 103 meq/L (ref 98–109)
CO2: 31 mEq/L — ABNORMAL HIGH (ref 22–29)
Creatinine: 1.5 mg/dL — ABNORMAL HIGH (ref 0.7–1.3)
Glucose: 111 mg/dl (ref 70–140)
Potassium: 3.9 mEq/L (ref 3.5–5.1)
Sodium: 143 mEq/L (ref 136–145)
TOTAL PROTEIN: 5.7 g/dL — AB (ref 6.4–8.3)
Total Bilirubin: 0.26 mg/dL (ref 0.20–1.20)

## 2013-09-24 ENCOUNTER — Encounter (HOSPITAL_COMMUNITY): Payer: Self-pay | Admitting: Emergency Medicine

## 2013-09-24 ENCOUNTER — Inpatient Hospital Stay (HOSPITAL_COMMUNITY)
Admission: EM | Admit: 2013-09-24 | Discharge: 2013-10-06 | DRG: 180 | Disposition: A | Discharge status: Home / Self Care | Payer: Medicare Other | Attending: Internal Medicine | Admitting: Internal Medicine

## 2013-09-24 ENCOUNTER — Telehealth: Payer: Self-pay | Admitting: Medical Oncology

## 2013-09-24 ENCOUNTER — Other Ambulatory Visit: Payer: Self-pay

## 2013-09-24 ENCOUNTER — Emergency Department (HOSPITAL_COMMUNITY): Payer: Medicare Other

## 2013-09-24 DIAGNOSIS — C3480 Malignant neoplasm of overlapping sites of unspecified bronchus and lung: Secondary | ICD-10-CM

## 2013-09-24 DIAGNOSIS — J90 Pleural effusion, not elsewhere classified: Secondary | ICD-10-CM | POA: Diagnosis present

## 2013-09-24 DIAGNOSIS — R4182 Altered mental status, unspecified: Secondary | ICD-10-CM | POA: Diagnosis present

## 2013-09-24 DIAGNOSIS — E876 Hypokalemia: Secondary | ICD-10-CM | POA: Diagnosis present

## 2013-09-24 DIAGNOSIS — I472 Ventricular tachycardia, unspecified: Secondary | ICD-10-CM | POA: Diagnosis not present

## 2013-09-24 DIAGNOSIS — J9601 Acute respiratory failure with hypoxia: Secondary | ICD-10-CM

## 2013-09-24 DIAGNOSIS — J9621 Acute and chronic respiratory failure with hypoxia: Secondary | ICD-10-CM | POA: Diagnosis present

## 2013-09-24 DIAGNOSIS — R64 Cachexia: Secondary | ICD-10-CM | POA: Diagnosis present

## 2013-09-24 DIAGNOSIS — Z9221 Personal history of antineoplastic chemotherapy: Secondary | ICD-10-CM | POA: Diagnosis not present

## 2013-09-24 DIAGNOSIS — F17200 Nicotine dependence, unspecified, uncomplicated: Secondary | ICD-10-CM | POA: Diagnosis present

## 2013-09-24 DIAGNOSIS — M549 Dorsalgia, unspecified: Secondary | ICD-10-CM | POA: Diagnosis present

## 2013-09-24 DIAGNOSIS — G9340 Encephalopathy, unspecified: Secondary | ICD-10-CM | POA: Diagnosis present

## 2013-09-24 DIAGNOSIS — Z79899 Other long term (current) drug therapy: Secondary | ICD-10-CM | POA: Diagnosis not present

## 2013-09-24 DIAGNOSIS — IMO0002 Reserved for concepts with insufficient information to code with codable children: Secondary | ICD-10-CM

## 2013-09-24 DIAGNOSIS — J869 Pyothorax without fistula: Secondary | ICD-10-CM | POA: Diagnosis present

## 2013-09-24 DIAGNOSIS — Z791 Long term (current) use of non-steroidal anti-inflammatories (NSAID): Secondary | ICD-10-CM

## 2013-09-24 DIAGNOSIS — C786 Secondary malignant neoplasm of retroperitoneum and peritoneum: Secondary | ICD-10-CM | POA: Diagnosis present

## 2013-09-24 DIAGNOSIS — R059 Cough, unspecified: Secondary | ICD-10-CM | POA: Diagnosis present

## 2013-09-24 DIAGNOSIS — C3490 Malignant neoplasm of unspecified part of unspecified bronchus or lung: Secondary | ICD-10-CM

## 2013-09-24 DIAGNOSIS — I129 Hypertensive chronic kidney disease with stage 1 through stage 4 chronic kidney disease, or unspecified chronic kidney disease: Secondary | ICD-10-CM | POA: Diagnosis present

## 2013-09-24 DIAGNOSIS — J9600 Acute respiratory failure, unspecified whether with hypoxia or hypercapnia: Secondary | ICD-10-CM | POA: Diagnosis present

## 2013-09-24 DIAGNOSIS — J910 Malignant pleural effusion: Secondary | ICD-10-CM | POA: Diagnosis present

## 2013-09-24 DIAGNOSIS — J189 Pneumonia, unspecified organism: Secondary | ICD-10-CM

## 2013-09-24 DIAGNOSIS — R180 Malignant ascites: Secondary | ICD-10-CM | POA: Diagnosis present

## 2013-09-24 DIAGNOSIS — I4729 Other ventricular tachycardia: Secondary | ICD-10-CM | POA: Diagnosis not present

## 2013-09-24 DIAGNOSIS — E43 Unspecified severe protein-calorie malnutrition: Secondary | ICD-10-CM | POA: Diagnosis present

## 2013-09-24 DIAGNOSIS — N183 Chronic kidney disease, stage 3 unspecified: Secondary | ICD-10-CM | POA: Diagnosis present

## 2013-09-24 DIAGNOSIS — Z809 Family history of malignant neoplasm, unspecified: Secondary | ICD-10-CM

## 2013-09-24 DIAGNOSIS — G8929 Other chronic pain: Secondary | ICD-10-CM | POA: Diagnosis present

## 2013-09-24 DIAGNOSIS — R05 Cough: Secondary | ICD-10-CM | POA: Diagnosis present

## 2013-09-24 DIAGNOSIS — C3400 Malignant neoplasm of unspecified main bronchus: Secondary | ICD-10-CM

## 2013-09-24 LAB — BASIC METABOLIC PANEL
Anion gap: 10 (ref 5–15)
BUN: 18 mg/dL (ref 6–23)
CO2: 28 mEq/L (ref 19–32)
Calcium: 8.7 mg/dL (ref 8.4–10.5)
Chloride: 100 mEq/L (ref 96–112)
Creatinine, Ser: 1.49 mg/dL — ABNORMAL HIGH (ref 0.50–1.35)
GFR calc Af Amer: 58 mL/min — ABNORMAL LOW (ref >90)
GFR calc non Af Amer: 50 mL/min — ABNORMAL LOW (ref >90)
Glucose, Bld: 158 mg/dL — ABNORMAL HIGH (ref 70–99)
Potassium: 3.9 mEq/L (ref 3.7–5.3)
Sodium: 138 mEq/L (ref 137–147)

## 2013-09-24 LAB — URINALYSIS, ROUTINE W REFLEX MICROSCOPIC
GLUCOSE, UA: NEGATIVE mg/dL
Hgb urine dipstick: NEGATIVE
KETONES UR: NEGATIVE mg/dL
Leukocytes, UA: NEGATIVE
Nitrite: NEGATIVE
Protein, ur: NEGATIVE mg/dL
Specific Gravity, Urine: 1.021 (ref 1.005–1.030)
Urobilinogen, UA: 0.2 mg/dL (ref 0.0–1.0)
pH: 5.5 (ref 5.0–8.0)

## 2013-09-24 LAB — CBC
HCT: 42.1 % (ref 39.0–52.0)
Hemoglobin: 13.1 g/dL (ref 13.0–17.0)
MCH: 27.3 pg (ref 26.0–34.0)
MCHC: 31.1 g/dL (ref 30.0–36.0)
MCV: 87.7 fL (ref 78.0–100.0)
Platelets: 273 10*3/uL (ref 150–400)
RBC: 4.8 MIL/uL (ref 4.22–5.81)
RDW: 14.2 % (ref 11.5–15.5)
WBC: 16.1 10*3/uL — ABNORMAL HIGH (ref 4.0–10.5)

## 2013-09-24 LAB — I-STAT CG4 LACTIC ACID, ED: Lactic Acid, Venous: 1.6 mmol/L (ref 0.5–2.2)

## 2013-09-24 LAB — I-STAT TROPONIN, ED: Troponin i, poc: 0.01 ng/mL (ref 0.00–0.08)

## 2013-09-24 LAB — PRO B NATRIURETIC PEPTIDE: PRO B NATRI PEPTIDE: 221.7 pg/mL — AB (ref 0–125)

## 2013-09-24 MED ORDER — PIPERACILLIN-TAZOBACTAM 3.375 G IVPB
3.3750 g | Freq: Three times a day (TID) | INTRAVENOUS | Status: DC
  Administered 2013-09-25 – 2013-10-06 (×34): 3.375 g via INTRAVENOUS
  Filled 2013-09-24 (×36): qty 50

## 2013-09-24 MED ORDER — ACETAMINOPHEN 325 MG PO TABS
650.0000 mg | ORAL_TABLET | Freq: Four times a day (QID) | ORAL | Status: DC | PRN
  Administered 2013-09-26: 650 mg via ORAL
  Filled 2013-09-24: qty 2

## 2013-09-24 MED ORDER — ENSURE COMPLETE PO LIQD
237.0000 mL | Freq: Two times a day (BID) | ORAL | Status: DC
  Administered 2013-09-25: 237 mL via ORAL

## 2013-09-24 MED ORDER — ENOXAPARIN SODIUM 40 MG/0.4ML ~~LOC~~ SOLN
40.0000 mg | SUBCUTANEOUS | Status: DC
  Administered 2013-09-24 – 2013-10-05 (×12): 40 mg via SUBCUTANEOUS
  Filled 2013-09-24 (×13): qty 0.4

## 2013-09-24 MED ORDER — GUAIFENESIN ER 600 MG PO TB12
1200.0000 mg | ORAL_TABLET | Freq: Two times a day (BID) | ORAL | Status: DC
  Administered 2013-09-24 – 2013-10-06 (×24): 1200 mg via ORAL
  Filled 2013-09-24 (×26): qty 2

## 2013-09-24 MED ORDER — AMLODIPINE BESYLATE 10 MG PO TABS
10.0000 mg | ORAL_TABLET | Freq: Every day | ORAL | Status: DC
  Administered 2013-09-24 – 2013-09-29 (×6): 10 mg via ORAL
  Filled 2013-09-24 (×13): qty 1

## 2013-09-24 MED ORDER — METOPROLOL SUCCINATE ER 50 MG PO TB24
50.0000 mg | ORAL_TABLET | Freq: Every day | ORAL | Status: DC
  Administered 2013-09-24 – 2013-09-30 (×7): 50 mg via ORAL
  Filled 2013-09-24 (×13): qty 1

## 2013-09-24 MED ORDER — ACETAMINOPHEN 650 MG RE SUPP: 650.0000 mg | Freq: Four times a day (QID) | RECTAL | Status: DC | PRN

## 2013-09-24 MED ORDER — FLUTICASONE PROPIONATE 50 MCG/ACT NA SUSP
1.0000 | Freq: Every day | NASAL | Status: DC | PRN
  Filled 2013-09-24: qty 16

## 2013-09-24 MED ORDER — VANCOMYCIN HCL 500 MG IV SOLR
500.0000 mg | Freq: Two times a day (BID) | INTRAVENOUS | Status: DC
  Administered 2013-09-25 – 2013-10-03 (×17): 500 mg via INTRAVENOUS
  Filled 2013-09-24 (×18): qty 500

## 2013-09-24 MED ORDER — ONDANSETRON HCL 4 MG PO TABS: 4.0000 mg | ORAL_TABLET | Freq: Four times a day (QID) | ORAL | Status: DC | PRN

## 2013-09-24 MED ORDER — GABAPENTIN 300 MG PO CAPS
300.0000 mg | ORAL_CAPSULE | Freq: Two times a day (BID) | ORAL | Status: DC
  Administered 2013-09-24 – 2013-09-26 (×5): 300 mg via ORAL
  Filled 2013-09-24 (×7): qty 1

## 2013-09-24 MED ORDER — OXYCODONE HCL ER 20 MG PO T12A
40.0000 mg | EXTENDED_RELEASE_TABLET | Freq: Three times a day (TID) | ORAL | Status: DC
  Administered 2013-09-24 – 2013-10-06 (×35): 40 mg via ORAL
  Filled 2013-09-24 (×36): qty 2

## 2013-09-24 MED ORDER — SODIUM CHLORIDE 0.9 % IJ SOLN
3.0000 mL | Freq: Two times a day (BID) | INTRAMUSCULAR | Status: DC
  Administered 2013-09-26 – 2013-09-28 (×6): 3 mL via INTRAVENOUS

## 2013-09-24 MED ORDER — OXYCODONE HCL 5 MG PO TABS
20.0000 mg | ORAL_TABLET | Freq: Three times a day (TID) | ORAL | Status: DC | PRN
  Administered 2013-10-02 – 2013-10-05 (×4): 20 mg via ORAL
  Filled 2013-09-24 (×5): qty 4

## 2013-09-24 MED ORDER — ADULT MULTIVITAMIN W/MINERALS CH
1.0000 | ORAL_TABLET | Freq: Every day | ORAL | Status: DC
  Administered 2013-09-24 – 2013-10-03 (×7): 1 via ORAL
  Filled 2013-09-24 (×16): qty 1

## 2013-09-24 MED ORDER — ERLOTINIB HCL 150 MG PO TABS
150.0000 mg | ORAL_TABLET | Freq: Every day | ORAL | Status: DC
  Administered 2013-09-24 – 2013-09-27 (×4): 150 mg via ORAL
  Filled 2013-09-24 (×7): qty 1

## 2013-09-24 MED ORDER — ALPRAZOLAM 1 MG PO TABS
1.0000 mg | ORAL_TABLET | Freq: Three times a day (TID) | ORAL | Status: DC | PRN
  Administered 2013-09-27 – 2013-10-05 (×11): 1 mg via ORAL
  Filled 2013-09-24 (×11): qty 1

## 2013-09-24 MED ORDER — GUAIFENESIN-DM 100-10 MG/5ML PO SYRP: 5.0000 mL | ORAL_SOLUTION | ORAL | Status: DC | PRN

## 2013-09-24 MED ORDER — SERTRALINE HCL 100 MG PO TABS
100.0000 mg | ORAL_TABLET | Freq: Every day | ORAL | Status: DC
  Administered 2013-09-24 – 2013-10-05 (×12): 100 mg via ORAL
  Filled 2013-09-24 (×13): qty 1

## 2013-09-24 MED ORDER — POLYVINYL ALCOHOL 1.4 % OP SOLN
1.0000 [drp] | OPHTHALMIC | Status: DC | PRN
  Filled 2013-09-24: qty 15

## 2013-09-24 MED ORDER — FOLIC ACID 400 MCG PO TABS: 400.0000 ug | ORAL_TABLET | Freq: Every day | ORAL | Status: DC

## 2013-09-24 MED ORDER — VANCOMYCIN HCL IN DEXTROSE 1-5 GM/200ML-% IV SOLN
1000.0000 mg | Freq: Once | INTRAVENOUS | Status: AC
  Administered 2013-09-24: 1000 mg via INTRAVENOUS
  Filled 2013-09-24: qty 200

## 2013-09-24 MED ORDER — FOLIC ACID 0.5 MG HALF TAB
0.5000 mg | ORAL_TABLET | Freq: Every day | ORAL | Status: DC
  Administered 2013-09-24 – 2013-10-06 (×12): 0.5 mg via ORAL
  Filled 2013-09-24 (×14): qty 1

## 2013-09-24 MED ORDER — ZOLPIDEM TARTRATE 5 MG PO TABS
5.0000 mg | ORAL_TABLET | Freq: Every evening | ORAL | Status: DC | PRN
  Administered 2013-09-24 – 2013-10-04 (×2): 5 mg via ORAL
  Filled 2013-09-24 (×2): qty 1

## 2013-09-24 MED ORDER — ALBUTEROL SULFATE (2.5 MG/3ML) 0.083% IN NEBU: 2.5000 mg | INHALATION_SOLUTION | RESPIRATORY_TRACT | Status: DC | PRN

## 2013-09-24 MED ORDER — PIPERACILLIN-TAZOBACTAM 3.375 G IVPB
3.3750 g | Freq: Once | INTRAVENOUS | Status: AC
  Administered 2013-09-24: 3.375 g via INTRAVENOUS
  Filled 2013-09-24: qty 50

## 2013-09-24 MED ORDER — ONDANSETRON HCL 4 MG/2ML IJ SOLN
4.0000 mg | Freq: Four times a day (QID) | INTRAMUSCULAR | Status: DC | PRN
  Administered 2013-09-30: 4 mg via INTRAVENOUS
  Filled 2013-09-24: qty 2

## 2013-09-24 MED ORDER — IBUPROFEN 600 MG PO TABS
600.0000 mg | ORAL_TABLET | Freq: Four times a day (QID) | ORAL | Status: DC | PRN
  Filled 2013-09-24: qty 1

## 2013-09-24 NOTE — ED Provider Notes (Signed)
CSN: [id number]     Arrival date & time 09/24/13  1546 History   First MD Initiated Contact with Patient 09/24/13 1637     Chief Complaint  Patient presents with  . Shortness of Breath     (Consider location/radiation/quality/duration/timing/severity/associated sxs/prior Treatment) Patient is a 58 y.o. male presenting with shortness of breath. The history is provided by the patient and the spouse.  Shortness of Breath  patient here complaining of worsening cough and congestion x3 days. Fevers today up to 100.4 with associated pulse ox at home of 85%. He does not use home oxygen but does have a history of lung cancer. Cough productive of green yellow sputum. No vomiting or diarrhea. Had CAT scan of his chest yesterday which I reviewed which show possibly early infection. Symptoms becoming worse and nothing makes them better. Does the more short of breath with exertion. Denies any anginal type chest pain. No treatment used prior to arrival.  Past Medical History  Diagnosis Date  . Hypertension   . lung ca dx'd 05/2007    chemo comp 12/2009  . 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 Hx antineoplastic chemo 2011   History reviewed. No pertinent past surgical history. Family History  Problem Relation Age of Onset  . Cancer Brother    History  Substance Use Topics  . Smoking status: Light Tobacco Smoker    Types: Cigarettes  . Smokeless tobacco: Never Used  . Alcohol Use: No    Review of Systems  Respiratory: Positive for shortness of breath.   All other systems reviewed and are negative.     Allergies  Review of patient's allergies indicates no known allergies.  Home Medications   Prior to Admission medications   Medication Sig Start Date End Date Taking? Authorizing Provider  ALPRAZolam Duanne Moron) 1 MG tablet Take 1 mg by mouth 3 (three) times daily as needed for anxiety.     Historical Provider, MD  amLODipine (NORVASC) 10 MG tablet Take 10 mg by  mouth at bedtime.     Historical Provider, MD  ENSURE (ENSURE) Take 237 mLs by mouth 2 (two) times daily between meals.    Historical Provider, MD  erlotinib (TARCEVA) 150 MG tablet Take 1 tablet (150 mg total) by mouth daily. Take on an empty stomach 1 hour before meals or 2 hours after. 06/30/13   Curt Bears, MD  fluconazole (DIFLUCAN) 100 MG tablet Take 1 tablet (100 mg total) by mouth daily. 09/02/13   Curt Bears, MD  fluticasone (FLONASE) 50 MCG/ACT nasal spray Place 1 spray into both nostrils daily.  11/27/12   Historical Provider, MD  folic acid (FOLVITE) 834 MCG tablet Take 400 mcg by mouth daily.    Historical Provider, MD  gabapentin (NEURONTIN) 300 MG capsule Take 300 mg by mouth 2 (two) times daily.    Historical Provider, MD  ibuprofen (ADVIL,MOTRIN) 200 MG tablet Take 600 mg by mouth every 6 (six) hours as needed for moderate pain.    Historical Provider, MD  metoprolol succinate (TOPROL-XL) 50 MG 24 hr tablet Take 50 mg by mouth at bedtime. Take with or immediately following a meal.    Historical Provider, MD  Multiple Vitamin (MULTIVITAMIN WITH MINERALS) TABS tablet Take 1 tablet by mouth daily.    Historical Provider, MD  OxyCODONE (OXYCONTIN) 40 mg T12A 12 hr tablet Take 40 mg by mouth 3 (three) times daily.    Historical Provider, MD  Oxycodone  HCl 20 MG TABS Take 20 mg by mouth 3 (three) times daily as needed (pain).    Historical Provider, MD  polyvinyl alcohol (LIQUIFILM TEARS) 1.4 % ophthalmic solution Place 1 drop into both eyes as needed for dry eyes.    Historical Provider, MD  promethazine (PHENERGAN) 25 MG suppository Place 25 mg rectally every 6 (six) hours as needed for nausea.  02/09/13   Historical Provider, MD  sertraline (ZOLOFT) 100 MG tablet Take 100 mg by mouth at bedtime.     Historical Provider, MD  valACYclovir (VALTREX) 500 MG tablet Take 1 tablet (500 mg total) by mouth 2 (two) times daily. 09/02/13   Curt Bears, MD  zolpidem (AMBIEN) 10 MG tablet  Take 10 mg by mouth at bedtime as needed for sleep.     Historical Provider, MD   BP 119/82  Pulse 98  Temp(Src) 98.7 F (37.1 C) (Oral)  Resp 20  SpO2 95% Physical Exam  Nursing note and vitals reviewed. Constitutional: He is oriented to person, place, and time. He appears cachectic.  Non-toxic appearance. No distress.  HENT:  Head: Normocephalic and atraumatic.  Eyes: Conjunctivae, EOM and lids are normal. Pupils are equal, round, and reactive to light.  Neck: Normal range of motion. Neck supple. No tracheal deviation present. No mass present.  Cardiovascular: Normal rate, regular rhythm and normal heart sounds.  Exam reveals no gallop.   No murmur heard. Pulmonary/Chest: Effort normal. No stridor. No respiratory distress. He has no decreased breath sounds. He has wheezes. He has no rhonchi. He has no rales.  Abdominal: Soft. Normal appearance and bowel sounds are normal. He exhibits no distension. There is no tenderness. There is no rebound and no CVA tenderness.  Musculoskeletal: Normal range of motion. He exhibits no edema and no tenderness.  Neurological: He is alert and oriented to person, place, and time. He has normal strength. No cranial nerve deficit or sensory deficit. GCS eye subscore is 4. GCS verbal subscore is 5. GCS motor subscore is 6.  Skin: Skin is warm and dry. No abrasion and no rash noted.  Psychiatric: He has a normal mood and affect. His speech is normal and behavior is normal.    ED Course  Procedures (including critical care time) Labs Review Labs Reviewed  URINE CULTURE  BASIC METABOLIC PANEL  CBC  URINALYSIS, ROUTINE W REFLEX MICROSCOPIC  PRO B NATRIURETIC PEPTIDE  I-STAT Ackerman, ED  I-STAT CG4 LACTIC ACID, ED    Imaging Review Ct Abdomen Pelvis Wo Contrast  09/23/2013   CLINICAL DATA:  Lung cancer diagnosed 3/9. Chemotherapy complete. No current complaints. Malignant neoplasm of right upper lobe. Hypertension.  EXAM: CT CHEST, ABDOMEN AND  PELVIS WITHOUT CONTRAST  TECHNIQUE: Multidetector CT imaging of the chest, abdomen and pelvis was performed following the standard protocol without IV contrast.  COMPARISON:  06/03/2013  FINDINGS: CT CHEST FINDINGS  Lungs/Pleura: Secretions within the trachea. Patent airways, with progressive bronchial wall thickening to the lower lobes. Greater on the right.  Improved right lung aeration with decreased volume loss and compressive atelectasis. Persistent right lower lobe airspace disease and volume loss. Development of patchy right upper lobe airspace disease with septal thickening. More mild patchy airspace disease and septal thickening is seen within the left upper and less so left lower lobes.  Mild motion degradation throughout.  New or enlarged left lower lobe lung nodule at 5 mm on image 34/series 4. A 3 mm left lower lobe nodule on image 49 is also  new or enlarged.  Small to moderate left-sided pleural effusion which is new since the prior CT. Circumferential right-sided pleural thickening with decreased right-sided pleural fluid. There is a right-sided small bore pleural drain which terminates over the right lung apex. Right pleural air which is likely secondary.  Heart/Mediastinum: Tortuous thoracic aorta. Normal heart size without pericardial effusion. Multivessel coronary artery atherosclerosis. Right-sided Port-A-Cath which terminates at the superior caval/ atrial junction.  No mediastinal or definite hilar adenopathy, given limitations of unenhanced CT.  CT ABDOMEN AND PELVIS FINDINGS  Abdomen/Pelvis: New small volume abdominal pelvic ascites. Normal spleen, stomach, pancreas. Cholelithiasis. Normal adrenal glands. Mild bilateral renal cortical atrophy.  Aortic and branch vessel atherosclerosis. No retroperitoneal or retrocrural adenopathy. Normal colon and terminal ileum. Normal caliber of small bowel, without obstruction.  Development of omental thickening throughout. No well-defined omental mass.  No  pelvic adenopathy. Decompressed urinary bladder. Normal prostate. Right inguinal nodes are not pathologic by size criteria. Slightly enlarged since the prior exam on image 107. Nonspecific.  Bones/Musculoskeletal: No acute osseous abnormality. Disc bulge at the lumbosacral junction.  IMPRESSION: CT CHEST IMPRESSION  1. New versus enlarged left-sided pulmonary nodules, most consistent with metastatic disease. 2. Mildly motion degraded evaluation the chest. 3. Decrease right pleural effusion since 06/03/2013. Right chest tube in place with presumed secondary pleural air. 4. Decreased right sided collapse with development of right greater than left patchy airspace disease and septal thickening. Morphology is suspicious for lymphangitic tumor spread. Infection or drug toxicity could look similar. 5. Small to moderate left pleural effusion is new.  CT ABDOMEN AND PELVIS IMPRESSION  1. Development of small volume abdominal pelvic ascites and extensive omental thickening, most consistent with peritoneal/omental metastasis. 2. Cholelithiasis.   Electronically Signed   By: Abigail Miyamoto M.D.   On: 09/23/2013 16:02   Ct Chest Wo Contrast  09/23/2013   CLINICAL DATA:  Lung cancer diagnosed 3/9. Chemotherapy complete. No current complaints. Malignant neoplasm of right upper lobe. Hypertension.  EXAM: CT CHEST, ABDOMEN AND PELVIS WITHOUT CONTRAST  TECHNIQUE: Multidetector CT imaging of the chest, abdomen and pelvis was performed following the standard protocol without IV contrast.  COMPARISON:  06/03/2013  FINDINGS: CT CHEST FINDINGS  Lungs/Pleura: Secretions within the trachea. Patent airways, with progressive bronchial wall thickening to the lower lobes. Greater on the right.  Improved right lung aeration with decreased volume loss and compressive atelectasis. Persistent right lower lobe airspace disease and volume loss. Development of patchy right upper lobe airspace disease with septal thickening. More mild patchy airspace  disease and septal thickening is seen within the left upper and less so left lower lobes.  Mild motion degradation throughout.  New or enlarged left lower lobe lung nodule at 5 mm on image 34/series 4. A 3 mm left lower lobe nodule on image 49 is also new or enlarged.  Small to moderate left-sided pleural effusion which is new since the prior CT. Circumferential right-sided pleural thickening with decreased right-sided pleural fluid. There is a right-sided small bore pleural drain which terminates over the right lung apex. Right pleural air which is likely secondary.  Heart/Mediastinum: Tortuous thoracic aorta. Normal heart size without pericardial effusion. Multivessel coronary artery atherosclerosis. Right-sided Port-A-Cath which terminates at the superior caval/ atrial junction.  No mediastinal or definite hilar adenopathy, given limitations of unenhanced CT.  CT ABDOMEN AND PELVIS FINDINGS  Abdomen/Pelvis: New small volume abdominal pelvic ascites. Normal spleen, stomach, pancreas. Cholelithiasis. Normal adrenal glands. Mild bilateral renal cortical atrophy.  Aortic and  branch vessel atherosclerosis. No retroperitoneal or retrocrural adenopathy. Normal colon and terminal ileum. Normal caliber of small bowel, without obstruction.  Development of omental thickening throughout. No well-defined omental mass.  No pelvic adenopathy. Decompressed urinary bladder. Normal prostate. Right inguinal nodes are not pathologic by size criteria. Slightly enlarged since the prior exam on image 107. Nonspecific.  Bones/Musculoskeletal: No acute osseous abnormality. Disc bulge at the lumbosacral junction.  IMPRESSION: CT CHEST IMPRESSION  1. New versus enlarged left-sided pulmonary nodules, most consistent with metastatic disease. 2. Mildly motion degraded evaluation the chest. 3. Decrease right pleural effusion since 06/03/2013. Right chest tube in place with presumed secondary pleural air. 4. Decreased right sided collapse with  development of right greater than left patchy airspace disease and septal thickening. Morphology is suspicious for lymphangitic tumor spread. Infection or drug toxicity could look similar. 5. Small to moderate left pleural effusion is new.  CT ABDOMEN AND PELVIS IMPRESSION  1. Development of small volume abdominal pelvic ascites and extensive omental thickening, most consistent with peritoneal/omental metastasis. 2. Cholelithiasis.   Electronically Signed   By: Abigail Miyamoto M.D.   On: 09/23/2013 16:02     EKG Interpretation None      MDM   Final diagnoses:  None    Patient started on IV antibiotics for Hcap . Patient to be admitted to medicine service    Leota Jacobsen, MD 09/24/13 [phone]

## 2013-09-24 NOTE — ED Notes (Signed)
Patient transported to X-ray 

## 2013-09-24 NOTE — Telephone Encounter (Signed)
Silva Bandy, RN ( girlfriend)  concerned about pts condition and symptoms -has temp 100.4 f and he spit thermometer out of his mouth before she finished taking his temp, oxygen sat 79% room air and pt hallucinating . Pleurix catheter not draining, Per Dr Julien Nordmann I instructed Silva Bandy to get pt to [facility] ED .SHe is going to call EMS.

## 2013-09-24 NOTE — ED Notes (Signed)
Per pt, states history of lung cancer-woke up this am with O2 in the 70s-had CT scan done yesterday and it showed PNA

## 2013-09-24 NOTE — H&P (Signed)
History and Physical  [name] [name] HGD:[id number] DOB: 1955/04/11 DOA: 09/24/2013  Referring physician: Dr. Lacretia Leigh PCP: Woody Seller, MD  Outpatient Specialists:  1. Oncology: Dr. Curt Bears  Chief Complaint: Fever, cough.  HPI: [name] [name] is a 58 y.o. male with history of lung cancer on chemotherapy, right pleural effusion status post percutaneous drain, hypertension, former smoker, stage III chronic kidney disease, presented to the ED with the above complaints. Patient and significant other who is an Therapist, sports, at bedside provided history. We gives 2-3 days history of cough productive of intermittent yellow sputum, nasal congestion and runny nose. He was seen yesterday at outpatient oncology with CT chest, abdomen and pelvis which showed new versus worsening pulmonary metastatic disease, decreased right pleural effusion, decreased right-sided collapse with development of right greater than left patchy airspace disease and septal thickening suspicious for lymphangitic tumor spread versus infection versus drug toxicity and small to moderate left pleural effusion. CT abdomen showed small volume abdominal pelvic ascites and extensive omental thickening consistent with peritoneal/omental metastasis. He was otherwise in his usual state of health until this morning when he was found to have a fever of 100.4, oxygen saturation in the 70s and transiently confused. Patient's right-sided chest tube is drained twice a week on Mondays and Thursdays and during last drainage on Monday yielded 350 mL fluid. Oncologist was contacted patient was advised to come to the ED. In the ED, creatinine was 1.492 WBC 16.1 which was normal yesterday, BNP 221.7 and chest x-ray suggested diffuse pulmonary infiltrates right greater than left favoring pneumonia. Patient was started on vancomycin and Zosyn and hospitalist admission was requested.   Review of Systems: All systems reviewed and apart from history of presenting  illness, are negative.  Past Medical History  Diagnosis Date  . Hypertension   . lung ca dx'd 05/2007    chemo comp 12/2009  . 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 Hx antineoplastic chemo 2011   History reviewed. No pertinent past surgical history. Social History:  reports that he has been smoking Cigarettes.  He has been smoking about 0.00 packs per day. He has never used smokeless tobacco. He reports that he does not drink alcohol or use illicit drugs. Independent of activities of daily living.  No Known Allergies  Family History  Problem Relation Age of Onset  . Cancer Brother     Prior to Admission medications   Medication Sig Start Date End Date Taking? Authorizing Provider  ALPRAZolam Duanne Moron) 1 MG tablet Take 1 mg by mouth 3 (three) times daily as needed for anxiety.    Yes Historical Provider, MD  amLODipine (NORVASC) 10 MG tablet Take 10 mg by mouth at bedtime.    Yes Historical Provider, MD  ENSURE (ENSURE) Take 237 mLs by mouth 2 (two) times daily between meals.   Yes Historical Provider, MD  erlotinib (TARCEVA) 150 MG tablet Take 1 tablet (150 mg total) by mouth daily. Take on an empty stomach 1 hour before meals or 2 hours after. 06/30/13  Yes Curt Bears, MD  fluticasone (FLONASE) 50 MCG/ACT nasal spray Place 1 spray into both nostrils daily as needed for allergies.  11/27/12  Yes Historical Provider, MD  folic acid (FOLVITE) 962 MCG tablet Take 400 mcg by mouth daily.   Yes Historical Provider, MD  gabapentin (NEURONTIN) 300 MG capsule Take 300 mg by mouth 2 (two) times daily.   Yes Historical Provider, MD  ibuprofen (ADVIL,MOTRIN) 200 MG tablet Take 600 mg by mouth every 6 (six) hours as needed for moderate pain.   Yes Historical Provider, MD  metoprolol succinate (TOPROL-XL) 50 MG 24 hr tablet Take 50 mg by mouth at bedtime. Take with or immediately following a meal.   Yes Historical Provider, MD  Multiple Vitamin (MULTIVITAMIN WITH MINERALS)  TABS tablet Take 1 tablet by mouth daily.   Yes Historical Provider, MD  OxyCODONE (OXYCONTIN) 40 mg T12A 12 hr tablet Take 40 mg by mouth 3 (three) times daily.   Yes Historical Provider, MD  Oxycodone HCl 20 MG TABS Take 20 mg by mouth 3 (three) times daily as needed (pain).   Yes Historical Provider, MD  polyvinyl alcohol (LIQUIFILM TEARS) 1.4 % ophthalmic solution Place 1 drop into both eyes as needed for dry eyes.   Yes Historical Provider, MD  promethazine (PHENERGAN) 25 MG suppository Place 25 mg rectally every 6 (six) hours as needed for nausea.  02/09/13  Yes Historical Provider, MD  sertraline (ZOLOFT) 100 MG tablet Take 100 mg by mouth at bedtime.    Yes Historical Provider, MD  zolpidem (AMBIEN) 10 MG tablet Take 10 mg by mouth at bedtime as needed for sleep.    Yes Historical Provider, MD   Physical Exam: Filed Vitals:   09/24/13 1610 09/24/13 1638 09/24/13 1750  BP: 119/82  147/65  Pulse: 98  94  Temp: 98.7 F (37.1 C)  98.9 F (37.2 C)  TempSrc: Oral  Oral  Resp: 20  16  SpO2: 85% 95% 95%     General exam: Moderately built and thinly nourished male patient, lying comfortably supine on the gurney in no obvious distress.  Head, eyes and ENT: Nontraumatic and normocephalic. Pupils equally reacting to light and accommodation. Oral mucosa moist.  Neck: Supple. No JVD, carotid bruit or thyromegaly.  Lymphatics: No lymphadenopathy.  Respiratory system: Reduced breath sounds bibasally right greater than left with crackles in the right base. Rest of the lung fields are clear to auscultation. Right-sided percutaneous chest tube which has been clamped. No increased work of breathing.  Cardiovascular system: S1 and S2 heard, RRR. No JVD, murmurs, gallops, clicks or pedal edema.  Gastrointestinal system: Abdomen is nondistended, soft and nontender. Normal bowel sounds heard. No organomegaly or masses appreciated.  Central nervous system: Alert and oriented. No focal neurological  deficits.  Extremities: Symmetric 5 x 5 power. Peripheral pulses symmetrically felt.   Skin: No rashes or acute findings.  Musculoskeletal system: Negative exam.  Psychiatry: Pleasant and cooperative.   Labs on Admission:  Basic Metabolic Panel:  Recent Labs Lab 09/23/13 1312 09/24/13 1708  NA 143 138  K 3.9 3.9  CL  --  100  CO2 31* 28  GLUCOSE 111 158*  BUN 18.8 18  CREATININE 1.5* 1.49*  CALCIUM 9.1 8.7   Liver Function Tests:  Recent Labs Lab 09/23/13 1312  AST 21  ALT 10  ALKPHOS 67  BILITOT 0.26  PROT 5.7*  ALBUMIN 2.6*   No results found for this basename: LIPASE, AMYLASE,  in the last 168 hours No results found for this basename: AMMONIA,  in the last 168 hours CBC:  Recent Labs Lab 09/23/13 1312 09/24/13 1708  WBC 10.1 16.1*  NEUTROABS 7.9*  --   HGB 13.8 13.1  HCT 43.8 42.1  MCV 86.5 87.7  PLT 337 273   Cardiac Enzymes: No results found for this basename: CKTOTAL, CKMB, CKMBINDEX, TROPONINI,  in the last 168 hours  BNP (last 3 results)  Recent Labs  09/24/13 1637  PROBNP 221.7*   CBG: No results found for this basename: GLUCAP,  in the last 168 hours  Radiological Exams on Admission: Ct Abdomen Pelvis Wo Contrast  09/23/2013   CLINICAL DATA:  Lung cancer diagnosed 3/9. Chemotherapy complete. No current complaints. Malignant neoplasm of right upper lobe. Hypertension.  EXAM: CT CHEST, ABDOMEN AND PELVIS WITHOUT CONTRAST  TECHNIQUE: Multidetector CT imaging of the chest, abdomen and pelvis was performed following the standard protocol without IV contrast.  COMPARISON:  06/03/2013  FINDINGS: CT CHEST FINDINGS  Lungs/Pleura: Secretions within the trachea. Patent airways, with progressive bronchial wall thickening to the lower lobes. Greater on the right.  Improved right lung aeration with decreased volume loss and compressive atelectasis. Persistent right lower lobe airspace disease and volume loss. Development of patchy right upper lobe  airspace disease with septal thickening. More mild patchy airspace disease and septal thickening is seen within the left upper and less so left lower lobes.  Mild motion degradation throughout.  New or enlarged left lower lobe lung nodule at 5 mm on image 34/series 4. A 3 mm left lower lobe nodule on image 49 is also new or enlarged.  Small to moderate left-sided pleural effusion which is new since the prior CT. Circumferential right-sided pleural thickening with decreased right-sided pleural fluid. There is a right-sided small bore pleural drain which terminates over the right lung apex. Right pleural air which is likely secondary.  Heart/Mediastinum: Tortuous thoracic aorta. Normal heart size without pericardial effusion. Multivessel coronary artery atherosclerosis. Right-sided Port-A-Cath which terminates at the superior caval/ atrial junction.  No mediastinal or definite hilar adenopathy, given limitations of unenhanced CT.  CT ABDOMEN AND PELVIS FINDINGS  Abdomen/Pelvis: New small volume abdominal pelvic ascites. Normal spleen, stomach, pancreas. Cholelithiasis. Normal adrenal glands. Mild bilateral renal cortical atrophy.  Aortic and branch vessel atherosclerosis. No retroperitoneal or retrocrural adenopathy. Normal colon and terminal ileum. Normal caliber of small bowel, without obstruction.  Development of omental thickening throughout. No well-defined omental mass.  No pelvic adenopathy. Decompressed urinary bladder. Normal prostate. Right inguinal nodes are not pathologic by size criteria. Slightly enlarged since the prior exam on image 107. Nonspecific.  Bones/Musculoskeletal: No acute osseous abnormality. Disc bulge at the lumbosacral junction.  IMPRESSION: CT CHEST IMPRESSION  1. New versus enlarged left-sided pulmonary nodules, most consistent with metastatic disease. 2. Mildly motion degraded evaluation the chest. 3. Decrease right pleural effusion since 06/03/2013. Right chest tube in place with  presumed secondary pleural air. 4. Decreased right sided collapse with development of right greater than left patchy airspace disease and septal thickening. Morphology is suspicious for lymphangitic tumor spread. Infection or drug toxicity could look similar. 5. Small to moderate left pleural effusion is new.  CT ABDOMEN AND PELVIS IMPRESSION  1. Development of small volume abdominal pelvic ascites and extensive omental thickening, most consistent with peritoneal/omental metastasis. 2. Cholelithiasis.   Electronically Signed   By: Abigail Miyamoto M.D.   On: 09/23/2013 16:02   Dg Chest 2 View (if Patient Has Fever And/or Copd)  09/24/2013   CLINICAL DATA:  Shortness of breath today, or recent diagnosis of pneumonia, has a chest tube, history hypertension, smoking, lung cancer  EXAM: CHEST  2 VIEW  COMPARISON:  04/10/2013 ; correlation CT chest 09/23/2013  FINDINGS: RIGHT subclavian Port-A-Cath stable with tip projecting over SVC.  RIGHT thoracostomy tubes with tips at apex and RIGHT base.  Enlargement of cardiac silhouette.  Pulmonary vascular  congestion.  BILATERAL pleural effusions much greater on RIGHT.  Small loculated pneumothorax at inferior RIGHT hemi thorax.  Diffuse infiltrates throughout both lungs greater in RIGHT upper lobe, favor pneumonia.  No pneumothorax.  Pulmonary metastases identified on preceding chest CT are less well visualized.  Bones demineralized.  IMPRESSION: Diffuse pulmonary infiltrates RIGHT greater than LEFT favor pneumonia.  Pulmonary metastases seen on recent CT are less well visualized radiographically.  Persistent RIGHT hydropneumothorax despite 2 thoracostomy tubes.   Electronically Signed   By: Lavonia Dana M.D.   On: 09/24/2013 17:28   Ct Chest Wo Contrast  09/23/2013   CLINICAL DATA:  Lung cancer diagnosed 3/9. Chemotherapy complete. No current complaints. Malignant neoplasm of right upper lobe. Hypertension.  EXAM: CT CHEST, ABDOMEN AND PELVIS WITHOUT CONTRAST  TECHNIQUE:  Multidetector CT imaging of the chest, abdomen and pelvis was performed following the standard protocol without IV contrast.  COMPARISON:  06/03/2013  FINDINGS: CT CHEST FINDINGS  Lungs/Pleura: Secretions within the trachea. Patent airways, with progressive bronchial wall thickening to the lower lobes. Greater on the right.  Improved right lung aeration with decreased volume loss and compressive atelectasis. Persistent right lower lobe airspace disease and volume loss. Development of patchy right upper lobe airspace disease with septal thickening. More mild patchy airspace disease and septal thickening is seen within the left upper and less so left lower lobes.  Mild motion degradation throughout.  New or enlarged left lower lobe lung nodule at 5 mm on image 34/series 4. A 3 mm left lower lobe nodule on image 49 is also new or enlarged.  Small to moderate left-sided pleural effusion which is new since the prior CT. Circumferential right-sided pleural thickening with decreased right-sided pleural fluid. There is a right-sided small bore pleural drain which terminates over the right lung apex. Right pleural air which is likely secondary.  Heart/Mediastinum: Tortuous thoracic aorta. Normal heart size without pericardial effusion. Multivessel coronary artery atherosclerosis. Right-sided Port-A-Cath which terminates at the superior caval/ atrial junction.  No mediastinal or definite hilar adenopathy, given limitations of unenhanced CT.  CT ABDOMEN AND PELVIS FINDINGS  Abdomen/Pelvis: New small volume abdominal pelvic ascites. Normal spleen, stomach, pancreas. Cholelithiasis. Normal adrenal glands. Mild bilateral renal cortical atrophy.  Aortic and branch vessel atherosclerosis. No retroperitoneal or retrocrural adenopathy. Normal colon and terminal ileum. Normal caliber of small bowel, without obstruction.  Development of omental thickening throughout. No well-defined omental mass.  No pelvic adenopathy. Decompressed  urinary bladder. Normal prostate. Right inguinal nodes are not pathologic by size criteria. Slightly enlarged since the prior exam on image 107. Nonspecific.  Bones/Musculoskeletal: No acute osseous abnormality. Disc bulge at the lumbosacral junction.  IMPRESSION: CT CHEST IMPRESSION  1. New versus enlarged left-sided pulmonary nodules, most consistent with metastatic disease. 2. Mildly motion degraded evaluation the chest. 3. Decrease right pleural effusion since 06/03/2013. Right chest tube in place with presumed secondary pleural air. 4. Decreased right sided collapse with development of right greater than left patchy airspace disease and septal thickening. Morphology is suspicious for lymphangitic tumor spread. Infection or drug toxicity could look similar. 5. Small to moderate left pleural effusion is new.  CT ABDOMEN AND PELVIS IMPRESSION  1. Development of small volume abdominal pelvic ascites and extensive omental thickening, most consistent with peritoneal/omental metastasis. 2. Cholelithiasis.   Electronically Signed   By: Abigail Miyamoto M.D.   On: 09/23/2013 16:02    EKG: Independently reviewed. Sinus rhythm low voltage and no acute findings.  Assessment/Plan Principal Problem:  HCAP (healthcare-associated pneumonia) Active Problems:   Lung cancer   Recurrent right pleural effusion   Acute respiratory failure with hypoxia   Stage III chronic kidney disease   1. Possible healthcare associated pneumonia: Admit to telemetry and continue empiric IV Zosyn and vancomycin pending culture results. Treat supportively with Mucinex, cough medicines, bronchodilators and oxygen. 2. Acute hypoxic respiratory failure: Secondary to pneumonia versus progressive lung cancer and pleural effusions. Treat pneumonia as above. Consult interventional radiology in a.m. to followup on right-sided chest drain. Oxygen supplements and when necessary bronchodilators. 3. Stage III chronic kidney disease: Creatinine  seems to be at baseline. 4. Hypertension: Controlled. Continue home medications. 5. Chronic pain: Based on patient's home medication regimen. Continue same. 6. Transient altered mental status: Likely secondary to infectious process and hypoxemia. Resolved.      Code Status: Full  Family Communication:  discussed with significant other at bedside.   Disposition Plan:  home when medically stable.   Time spent:  60 minutes.   Vernell Leep, MD, FACP, FHM. [facility] Pager [phone]  If 7PM-7AM, please contact night-coverage [email or web address] Password [facility] 09/24/2013, 6:58 PM

## 2013-09-24 NOTE — Progress Notes (Signed)
ANTIBIOTIC CONSULT NOTE - INITIAL  Pharmacy Consult for Vancomycin/Zosyn Indication: HCAP  No Known Allergies  Patient Measurements: Height: 5\' 10"  (177.8 cm) Weight: 151 lb 4.8 oz (68.629 kg) IBW/kg (Calculated) : 73 Adjusted Body Weight:  Vital Signs: Temp: 98.1 F (36.7 C) (07/16 1943) Temp src: Oral (07/16 1943) BP: 142/63 mmHg (07/16 1943) Pulse Rate: 94 (07/16 1943) Intake/Output from previous day:   Intake/Output from this shift:    Labs:  Recent Labs  09/23/13 1312 09/23/13 1312 09/24/13 1708  WBC 10.1  --  16.1*  HGB 13.8  --  13.1  PLT 337  --  273  CREATININE  --  1.5* 1.49*   Estimated Creatinine Clearance: 51.8 ml/min (by C-G formula based on Cr of 1.49). No results found for this basename: VANCOTROUGH, VANCOPEAK, VANCORANDOM, GENTTROUGH, GENTPEAK, GENTRANDOM, TOBRATROUGH, TOBRAPEAK, TOBRARND, AMIKACINPEAK, AMIKACINTROU, AMIKACIN,  in the last 72 hours   Microbiology: No results found for this or any previous visit (from the past 720 hour(s)).  Medical History: Past Medical History  Diagnosis Date  . Hypertension   . lung ca dx'd 05/2007    chemo comp 12/2009  . 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 Hx antineoplastic chemo 2011    Medications:  Scheduled:  . amLODipine  10 mg Oral QHS  . enoxaparin (LOVENOX) injection  40 mg Subcutaneous Q24H  . erlotinib  150 mg Oral Daily  . [START ON 09/25/2013] feeding supplement (ENSURE COMPLETE)  237 mL Oral BID BM  . folic acid  0.5 mg Oral Daily  . gabapentin  300 mg Oral BID  . guaiFENesin  1,200 mg Oral BID  . metoprolol succinate  50 mg Oral QHS  . multivitamin with minerals  1 tablet Oral Daily  . OxyCODONE  40 mg Oral TID  . piperacillin-tazobactam (ZOSYN)  IV  3.375 g Intravenous Once  . sertraline  100 mg Oral QHS  . sodium chloride  3 mL Intravenous Q12H  . vancomycin  1,000 mg Intravenous Once   Infusions:   PRN: acetaminophen, acetaminophen, albuterol,  ALPRAZolam, fluticasone, guaiFENesin-dextromethorphan, ibuprofen, ondansetron (ZOFRAN) IV, ondansetron, oxyCODONE, polyvinyl alcohol, zolpidem  Assessment: 18 yom with h/o lung CA on chemo, R pleural effusion s/p perc drain, HTN, former smoker, stage III CKD. Presented to ED with fever, productive cough, yellow sputum, nasal congestion/runny nose. Vanc and Zosyn ordered in ED x1 for HCAP  7/16 >>zosyn  >> 7/16 >>vanco  >>    Tmax: afebrile WBCs: increasing 16.1 Renal: SCr 1.49 CrCl ~66ml/min  7/16 blood: sent 7/16 urine: sent   Goal of Therapy:  Vancomycin trough level 15-20 mcg/ml Appropriate antibiotic dosing for renal function; eradication of infection   Plan:  Start Zosyn 3.375g IV Q8H extended infusion and Vancomycin 500mg  IV Q12H Measure antibiotic drug levels at steady state Follow up culture results  Kizzie Furnish, PharmD Pager: [phone] 09/24/2013 8:00 PM

## 2013-09-24 NOTE — ED Notes (Signed)
In addition to below note, pt thinks his chest tube might be clogged again.

## 2013-09-25 ENCOUNTER — Inpatient Hospital Stay (HOSPITAL_COMMUNITY): Payer: Medicare Other

## 2013-09-25 LAB — CBC
HCT: 43.6 % (ref 39.0–52.0)
Hemoglobin: 13.4 g/dL (ref 13.0–17.0)
MCH: 27.2 pg (ref 26.0–34.0)
MCHC: 30.7 g/dL (ref 30.0–36.0)
MCV: 88.4 fL (ref 78.0–100.0)
PLATELETS: 278 10*3/uL (ref 150–400)
RBC: 4.93 MIL/uL (ref 4.22–5.81)
RDW: 14.2 % (ref 11.5–15.5)
WBC: 21.1 10*3/uL — ABNORMAL HIGH (ref 4.0–10.5)

## 2013-09-25 LAB — URINE CULTURE
CULTURE: NO GROWTH
Colony Count: NO GROWTH

## 2013-09-25 MED ORDER — FUROSEMIDE 10 MG/ML IJ SOLN
20.0000 mg | Freq: Once | INTRAMUSCULAR | Status: AC
  Administered 2013-09-25: 20 mg via INTRAVENOUS
  Filled 2013-09-25: qty 2

## 2013-09-25 MED ORDER — ENSURE COMPLETE PO LIQD
237.0000 mL | Freq: Three times a day (TID) | ORAL | Status: DC
  Administered 2013-09-25 – 2013-09-26 (×4): 237 mL via ORAL
  Administered 2013-09-26: 15:00:00 via ORAL
  Administered 2013-09-27 – 2013-09-30 (×11): 237 mL via ORAL

## 2013-09-25 MED ORDER — BIOTENE DRY MOUTH MT LIQD
15.0000 mL | Freq: Two times a day (BID) | OROMUCOSAL | Status: DC
  Administered 2013-09-25 – 2013-10-06 (×22): 15 mL via OROMUCOSAL

## 2013-09-25 NOTE — Progress Notes (Signed)
Pt continues to have o2 stats between 82-88%.  Turn, cough, deep breathing performed with no improvements.  Pt educated and used IS able to get up to 300.  After using IS o2 stat is 93% and patient states that he can tell a difference.  Education provided to perform IS q1hour 10 times while awake.  Pt verbalized understanding. Sicilia Killough A

## 2013-09-25 NOTE — Progress Notes (Signed)
Was called by 4th floor RN to help with drainage of pleurx catheter.  Per RN, Patient reported that he drains "Pleurx catheter" at home by himself and wife helps. Sterile technique was used to drain patient's catheter.  250 cc's were drained.  Noticed upon application of new cap that it did not fit.  Drain was actually an aspira drain rather than a pleurx drain specified by patient.  4th floor RN aware.  Interventional radiology not available now but will be notified in am to assess catheter and make sure it is intact.  Will follow up with Ria Comment, 4th floor RN. Azzie Glatter [place]

## 2013-09-25 NOTE — Progress Notes (Signed)
Spoke with Tsosie Billing, PA with Interventional Radiology about chest drainage tube.  Informed her about the need to possibly change the cap at the end of the tube.  Informed her that patient is stable and comfortable.  Koreen verbalized understanding and stated she would assess patient today.  Iantha Fallen RN 8:09 AM 09/25/2013

## 2013-09-25 NOTE — Progress Notes (Signed)
Afterwards when Ailene Ravel, RN placed pleurex drain cap on end of thoracostomy tube, the port's seal did not appear to be completely intact due to hearing possible small air leak. Quickly placed previous cap at the end of thoracostomy tube, using sterile technique.  No air leak was heard afterwards.  Placed clamp provided in pleurex drain kit onto thoracostomy tube per protocol for continued protection against air leak.  Redressed thoracostomy tube site using sterile technique.  Placed patient on continuous pulse oximetry to monitor oxygen levels.  Pt sleeping comfortably.  Will notify interventional radiology in AM to possibly replace the cap at the end of the chest tube.  Notified Dr. Conley Canal of this incident.  She ordered chest xray. Oxygen saturation for pt is 90% on 5L.  Dr. Conley Canal stated that as long is pt is not short of breath, then okay to maintain 5L of 02 and to not increase.  Pt resting comfortably at this time.  Will continue to monitor.  Iantha Fallen RN 3:49 AM 09/25/2013

## 2013-09-25 NOTE — Progress Notes (Signed)
IR was asked to evaluate patient's right sided aspira chest tube. A pleurx connector was used to attach to the aspira connector which then punctured the valve, the chest tube clamped and CXR performed without evidence of PTX. IR evaluated and replaced valve connector with new aspira connector and unclamped tube. Chest tube now ready to use for drainage. Per RN 250 cc removed yesterday.  Tsosie Billing PA-C Interventional Radiology  09/25/13  9:55 AM

## 2013-09-25 NOTE — Progress Notes (Signed)
[facility] PROGRESS NOTE  [name] [name] PXT:[id number] DOB: [birth year] DOA: 09/24/2013 PCP: Woody Seller, MD   Brief narrative 58 year old male with history of non-small cell lung cancer on chemotherapy (followed by Dr. Julien Nordmann), recurrent right pleural effusion status post percutaneous green, former smoker, hypertension, Stacie chronic kidney disease presented to the ED with fever and productive cough for past 2-3 days. He was seen in the oncology clinic one day prior to admission and had CT chest, abdomen and pelvis showing new versus worsening pulmonary metastatic disease, decreased right pleural effusion and decreased right-sided collapse with development of right greater than left patchy airspace disease with septal thickening suspicious for a metastatic tumor spread versus infection versus.[facility] with small to moderate left pleural effusion. CT of the abdomen through small volume abdominal and pelvic ascites with extensive omental thickening consistent with peritoneal/omental metastases. Patient on the day of admission had a fever of 100.69F, oxygen saturation in the 70s and confused transiently. His oncologist was contacted and was ask to come to the ED. In the ED patient had a white count of 16.1k, (2 was normal one day prior to admission) creatinine of 1.5,  chest x-ray suggestive of diffuse pulmonary infiltrates R>L suggestive of pneumonia. Patient was started on IV vancomycin and Zosyn and admitted to medical floor.  Assessment/Plan: Acute Hypoxic respiratory failure  Likely combination of Healthcare associated pneumonia, progressive lung cancer and pleural effusions On empiric vancomycin and Zosyn. Follow culture. Supportive care with Mucinex, bronchodilators and oxygen as needed -Seen by interventional radiology this morning and replaced the valve connector with new aspira connector and was ready for use for drainage.  Hypertension Stable. Continue home medications  Chronic  pain Resume home oxycodone. I would avoid ibuprofen he is on given his underlying renal disease  Transient encephalopathy Possibly related to hypoxia and underlying infection and has now resolved.  Metastatic non-small cell lung cancer Followed by Dr. Julien Nordmann. Currently on Tarceva daily for palliative chemotherapy.   Chronic kidney disease Renal function appears at baseline. We'll discontinue NSAIDs   Diet: Regular with supplements  DVT prophylaxis: Subcutaneous Lovenox   Code Status: Full code Family Communication: None at bedside Disposition Plan: Home once improved   Consultants:  IR  Procedures:  Change chest tube valve connector at bedside  Antibiotics:  IV vancomycin and Zosyn 7/16>>  HPI/Subjective: Patient seen and examined this morning. Informs his breathing to be much better today. Seen by IR and evaluated right-sided chest tube . replaced the valve connector with new aspira connector and was ready for use for drainage .  Objective: Filed Vitals:   09/25/13 0500  BP: 105/90  Pulse: 74  Temp: 99 F (37.2 C)  Resp: 18    Intake/Output Summary (Last 24 hours) at 09/25/13 1215 Last data filed at 09/25/13 6270  Gross per 24 hour  Intake  402.5 ml  Output    250 ml  Net  152.5 ml   Filed Weights   09/24/13 1943 09/25/13 0500  Weight: 68.629 kg (151 lb 4.8 oz) 68.5 kg (151 lb 0.2 oz)    Exam:   General:  Middle aged cachectic male in no acute distress  HEENT: No pallor, moist oral mucosa  Cardiovascular: Normal S1 and S2, no murmurs   Respiratory: Diminished breath sounds over right lung, has percutaneous drain  Abdomen: Soft, nontender, nondistended, bowel sounds present  Musculoskeletal: Warm, no edema  CNS: Alert and oriented  Data Reviewed: Basic Metabolic Panel:  Recent Labs Lab 09/23/13 1312 09/24/13  1708  NA 143 138  K 3.9 3.9  CL  --  100  CO2 31* 28  GLUCOSE 111 158*  BUN 18.8 18  CREATININE 1.5* 1.49*  CALCIUM 9.1  8.7   Liver Function Tests:  Recent Labs Lab 09/23/13 1312  AST 21  ALT 10  ALKPHOS 67  BILITOT 0.26  PROT 5.7*  ALBUMIN 2.6*   No results found for this basename: LIPASE, AMYLASE,  in the last 168 hours No results found for this basename: AMMONIA,  in the last 168 hours CBC:  Recent Labs Lab 09/23/13 1312 09/24/13 1708 09/25/13 0455  WBC 10.1 16.1* 21.1*  NEUTROABS 7.9*  --   --   HGB 13.8 13.1 13.4  HCT 43.8 42.1 43.6  MCV 86.5 87.7 88.4  PLT 337 273 278   Cardiac Enzymes: No results found for this basename: CKTOTAL, CKMB, CKMBINDEX, TROPONINI,  in the last 168 hours BNP (last 3 results)  Recent Labs  09/24/13 1637  PROBNP 221.7*   CBG: No results found for this basename: GLUCAP,  in the last 168 hours  Recent Results (from the past 240 hour(s))  CULTURE, BLOOD (ROUTINE X 2)     Status: None   Collection Time    09/24/13  5:06 PM      Result Value Ref Range Status   Specimen Description BLOOD LEFT FOREARM   Final   Special Requests BOTTLES DRAWN AEROBIC AND ANAEROBIC 5 ML   Final   Culture  Setup Time     Final   Value: 09/24/2013 22:28     Performed at [facility]   Culture     Final   Value:        BLOOD CULTURE RECEIVED NO GROWTH TO DATE CULTURE WILL BE HELD FOR 5 DAYS BEFORE ISSUING A FINAL NEGATIVE REPORT     Performed at [facility]   Report Status PENDING   Incomplete  CULTURE, BLOOD (ROUTINE X 2)     Status: None   Collection Time    09/24/13  5:06 PM      Result Value Ref Range Status   Specimen Description BLOOD RIGHT ANTECUBITAL   Final   Special Requests BOTTLES DRAWN AEROBIC AND ANAEROBIC 3 ML   Final   Culture  Setup Time     Final   Value: 09/24/2013 22:29     Performed at [facility]   Culture     Final   Value:        BLOOD CULTURE RECEIVED NO GROWTH TO DATE CULTURE WILL BE HELD FOR 5 DAYS BEFORE ISSUING A FINAL NEGATIVE REPORT     Performed at [facility]   Report Status PENDING   Incomplete      Studies: Ct Abdomen Pelvis Wo Contrast  09/23/2013   CLINICAL DATA:  Lung cancer diagnosed 3/9. Chemotherapy complete. No current complaints. Malignant neoplasm of right upper lobe. Hypertension.  EXAM: CT CHEST, ABDOMEN AND PELVIS WITHOUT CONTRAST  TECHNIQUE: Multidetector CT imaging of the chest, abdomen and pelvis was performed following the standard protocol without IV contrast.  COMPARISON:  06/03/2013  FINDINGS: CT CHEST FINDINGS  Lungs/Pleura: Secretions within the trachea. Patent airways, with progressive bronchial wall thickening to the lower lobes. Greater on the right.  Improved right lung aeration with decreased volume loss and compressive atelectasis. Persistent right lower lobe airspace disease and volume loss. Development of patchy right upper lobe airspace disease with septal thickening. More mild patchy airspace disease  and septal thickening is seen within the left upper and less so left lower lobes.  Mild motion degradation throughout.  New or enlarged left lower lobe lung nodule at 5 mm on image 34/series 4. A 3 mm left lower lobe nodule on image 49 is also new or enlarged.  Small to moderate left-sided pleural effusion which is new since the prior CT. Circumferential right-sided pleural thickening with decreased right-sided pleural fluid. There is a right-sided small bore pleural drain which terminates over the right lung apex. Right pleural air which is likely secondary.  Heart/Mediastinum: Tortuous thoracic aorta. Normal heart size without pericardial effusion. Multivessel coronary artery atherosclerosis. Right-sided Port-A-Cath which terminates at the superior caval/ atrial junction.  No mediastinal or definite hilar adenopathy, given limitations of unenhanced CT.  CT ABDOMEN AND PELVIS FINDINGS  Abdomen/Pelvis: New small volume abdominal pelvic ascites. Normal spleen, stomach, pancreas. Cholelithiasis. Normal adrenal glands. Mild bilateral renal cortical atrophy.  Aortic and branch  vessel atherosclerosis. No retroperitoneal or retrocrural adenopathy. Normal colon and terminal ileum. Normal caliber of small bowel, without obstruction.  Development of omental thickening throughout. No well-defined omental mass.  No pelvic adenopathy. Decompressed urinary bladder. Normal prostate. Right inguinal nodes are not pathologic by size criteria. Slightly enlarged since the prior exam on image 107. Nonspecific.  Bones/Musculoskeletal: No acute osseous abnormality. Disc bulge at the lumbosacral junction.  IMPRESSION: CT CHEST IMPRESSION  1. New versus enlarged left-sided pulmonary nodules, most consistent with metastatic disease. 2. Mildly motion degraded evaluation the chest. 3. Decrease right pleural effusion since 06/03/2013. Right chest tube in place with presumed secondary pleural air. 4. Decreased right sided collapse with development of right greater than left patchy airspace disease and septal thickening. Morphology is suspicious for lymphangitic tumor spread. Infection or drug toxicity could look similar. 5. Small to moderate left pleural effusion is new.  CT ABDOMEN AND PELVIS IMPRESSION  1. Development of small volume abdominal pelvic ascites and extensive omental thickening, most consistent with peritoneal/omental metastasis. 2. Cholelithiasis.   Electronically Signed   By: Abigail Miyamoto M.D.   On: 09/23/2013 16:02   Dg Chest 2 View (if Patient Has Fever And/or Copd)  09/24/2013   CLINICAL DATA:  Shortness of breath today, or recent diagnosis of pneumonia, has a chest tube, history hypertension, smoking, lung cancer  EXAM: CHEST  2 VIEW  COMPARISON:  04/10/2013 ; correlation CT chest 09/23/2013  FINDINGS: RIGHT subclavian Port-A-Cath stable with tip projecting over SVC.  RIGHT thoracostomy tubes with tips at apex and RIGHT base.  Enlargement of cardiac silhouette.  Pulmonary vascular congestion.  BILATERAL pleural effusions much greater on RIGHT.  Small loculated pneumothorax at inferior  RIGHT hemi thorax.  Diffuse infiltrates throughout both lungs greater in RIGHT upper lobe, favor pneumonia.  No pneumothorax.  Pulmonary metastases identified on preceding chest CT are less well visualized.  Bones demineralized.  IMPRESSION: Diffuse pulmonary infiltrates RIGHT greater than LEFT favor pneumonia.  Pulmonary metastases seen on recent CT are less well visualized radiographically.  Persistent RIGHT hydropneumothorax despite 2 thoracostomy tubes.   Electronically Signed   By: Lavonia Dana M.D.   On: 09/24/2013 17:28   Ct Chest Wo Contrast  09/23/2013   CLINICAL DATA:  Lung cancer diagnosed 3/9. Chemotherapy complete. No current complaints. Malignant neoplasm of right upper lobe. Hypertension.  EXAM: CT CHEST, ABDOMEN AND PELVIS WITHOUT CONTRAST  TECHNIQUE: Multidetector CT imaging of the chest, abdomen and pelvis was performed following the standard protocol without IV contrast.  COMPARISON:  06/03/2013  FINDINGS: CT CHEST FINDINGS  Lungs/Pleura: Secretions within the trachea. Patent airways, with progressive bronchial wall thickening to the lower lobes. Greater on the right.  Improved right lung aeration with decreased volume loss and compressive atelectasis. Persistent right lower lobe airspace disease and volume loss. Development of patchy right upper lobe airspace disease with septal thickening. More mild patchy airspace disease and septal thickening is seen within the left upper and less so left lower lobes.  Mild motion degradation throughout.  New or enlarged left lower lobe lung nodule at 5 mm on image 34/series 4. A 3 mm left lower lobe nodule on image 49 is also new or enlarged.  Small to moderate left-sided pleural effusion which is new since the prior CT. Circumferential right-sided pleural thickening with decreased right-sided pleural fluid. There is a right-sided small bore pleural drain which terminates over the right lung apex. Right pleural air which is likely secondary.   Heart/Mediastinum: Tortuous thoracic aorta. Normal heart size without pericardial effusion. Multivessel coronary artery atherosclerosis. Right-sided Port-A-Cath which terminates at the superior caval/ atrial junction.  No mediastinal or definite hilar adenopathy, given limitations of unenhanced CT.  CT ABDOMEN AND PELVIS FINDINGS  Abdomen/Pelvis: New small volume abdominal pelvic ascites. Normal spleen, stomach, pancreas. Cholelithiasis. Normal adrenal glands. Mild bilateral renal cortical atrophy.  Aortic and branch vessel atherosclerosis. No retroperitoneal or retrocrural adenopathy. Normal colon and terminal ileum. Normal caliber of small bowel, without obstruction.  Development of omental thickening throughout. No well-defined omental mass.  No pelvic adenopathy. Decompressed urinary bladder. Normal prostate. Right inguinal nodes are not pathologic by size criteria. Slightly enlarged since the prior exam on image 107. Nonspecific.  Bones/Musculoskeletal: No acute osseous abnormality. Disc bulge at the lumbosacral junction.  IMPRESSION: CT CHEST IMPRESSION  1. New versus enlarged left-sided pulmonary nodules, most consistent with metastatic disease. 2. Mildly motion degraded evaluation the chest. 3. Decrease right pleural effusion since 06/03/2013. Right chest tube in place with presumed secondary pleural air. 4. Decreased right sided collapse with development of right greater than left patchy airspace disease and septal thickening. Morphology is suspicious for lymphangitic tumor spread. Infection or drug toxicity could look similar. 5. Small to moderate left pleural effusion is new.  CT ABDOMEN AND PELVIS IMPRESSION  1. Development of small volume abdominal pelvic ascites and extensive omental thickening, most consistent with peritoneal/omental metastasis. 2. Cholelithiasis.   Electronically Signed   By: Abigail Miyamoto M.D.   On: 09/23/2013 16:02   Dg Chest Port 1 View  09/25/2013   CLINICAL DATA:  Hypoxia   EXAM: PORTABLE CHEST - 1 VIEW  COMPARISON:  Chest x-ray from yesterday  FINDINGS: No change in heart size or mediastinal contours.  There is a right IJ porta catheter which is in stable position. Right-sided small bore chest tube is also unchanged, tip at the apex.  Unchanged moderate, partially loculated right hydro pneumothorax. There is a small left pleural effusion which is unchanged. Increased interstitial coarsening throughout the lungs. Airspace disease and throughout the right lung appears similar to previous. There are peripheral ground-glass densities in the left lung. New bandlike opacity in the left lower lung.  IMPRESSION: 1. New pulmonary edema. 2. Right hydropneumothorax and left pleural effusion are stable from prior. 3. New left basilar opacity, timing favoring atelectasis.   Electronically Signed   By: Jorje Guild M.D.   On: 09/25/2013 05:12    Scheduled Meds: . amLODipine  10 mg Oral QHS  . antiseptic oral rinse  15 mL Mouth  Rinse BID  . enoxaparin (LOVENOX) injection  40 mg Subcutaneous Q24H  . erlotinib  150 mg Oral Daily  . feeding supplement (ENSURE COMPLETE)  237 mL Oral TID BM  . folic acid  0.5 mg Oral Daily  . gabapentin  300 mg Oral BID  . guaiFENesin  1,200 mg Oral BID  . metoprolol succinate  50 mg Oral QHS  . multivitamin with minerals  1 tablet Oral Daily  . OxyCODONE  40 mg Oral TID  . piperacillin-tazobactam (ZOSYN)  IV  3.375 g Intravenous 3 times per day  . sertraline  100 mg Oral QHS  . sodium chloride  3 mL Intravenous Q12H  . vancomycin  500 mg Intravenous Q12H   Continuous Infusions:    Time spent: 25 minutes    Kenlyn Lose, [place]  [facility] Pager [phone] If 7PM-7AM, please contact night-coverage at [email or web address], password [facility] 09/25/2013, 12:15 PM  LOS: 1 day

## 2013-09-25 NOTE — Progress Notes (Signed)
Nutrition Brief Note  Patient identified on the Malnutrition Screening Tool (MST) Report  Wt Readings from Last 15 Encounters:  09/25/13 151 lb 0.2 oz (68.5 kg)  09/02/13 149 lb 12.8 oz (67.949 kg)  08/06/13 150 lb 6.4 oz (68.221 kg)  07/20/13 158 lb 3.2 oz (71.759 kg)  06/30/13 153 lb 12.8 oz (69.763 kg)  06/09/13 156 lb 8 oz (70.988 kg)  02/19/13 161 lb (73.029 kg)  12/18/12 159 lb 4.8 oz (72.258 kg)  10/13/12 160 lb 9.6 oz (72.848 kg)  02/25/12 156 lb 12.8 oz (71.124 kg)  12/24/11 159 lb 14.4 oz (72.53 kg)  04/18/11 153 lb 8 oz (69.627 kg)    Body mass index is 21.67 kg/(m^2). Patient meets criteria for Normal weight based on current BMI.   Current diet order is heart healthy, patient is consuming approximately 75% of meals at this time. Labs and medications reviewed.   Pt denied any changes in appetite or weight pta. Diet recall indicates pt consumes lunch and dinner, and 3-5 Ensure Complete daily. Reported weight stable around 150 lbs. Has experienced some nausea/vomiting and thrush previously r/t to chemotherapy; however denied any current chemo-related nutrition side effects.  RD to order Ensure Complete TID between meals; and encouraged continued excellent PO intake  No additional nutrition interventions warranted at this time. If nutrition issues arise, please consult RD.   Atlee Abide MS RD LDN Clinical Dietitian [phone]

## 2013-09-26 LAB — CBC
HEMATOCRIT: 41.3 % (ref 39.0–52.0)
HEMOGLOBIN: 12.9 g/dL — AB (ref 13.0–17.0)
MCH: 27.3 pg (ref 26.0–34.0)
MCHC: 31.2 g/dL (ref 30.0–36.0)
MCV: 87.5 fL (ref 78.0–100.0)
Platelets: 321 10*3/uL (ref 150–400)
RBC: 4.72 MIL/uL (ref 4.22–5.81)
RDW: 14.1 % (ref 11.5–15.5)
WBC: 19.8 10*3/uL — AB (ref 4.0–10.5)

## 2013-09-26 LAB — CREATININE, SERUM
Creatinine, Ser: 1.74 mg/dL — ABNORMAL HIGH (ref 0.50–1.35)
GFR calc non Af Amer: 41 mL/min — ABNORMAL LOW (ref >90)
GFR, EST AFRICAN AMERICAN: 48 mL/min — AB (ref >90)

## 2013-09-26 MED ORDER — IPRATROPIUM-ALBUTEROL 0.5-2.5 (3) MG/3ML IN SOLN
3.0000 mL | RESPIRATORY_TRACT | Status: DC
  Administered 2013-09-26 (×2): 3 mL via RESPIRATORY_TRACT
  Filled 2013-09-26 (×2): qty 3

## 2013-09-26 MED ORDER — SODIUM CHLORIDE 0.9 % IV SOLN: INTRAVENOUS | Status: DC

## 2013-09-26 MED ORDER — IPRATROPIUM-ALBUTEROL 0.5-2.5 (3) MG/3ML IN SOLN
3.0000 mL | Freq: Three times a day (TID) | RESPIRATORY_TRACT | Status: DC
  Administered 2013-09-27 – 2013-10-06 (×24): 3 mL via RESPIRATORY_TRACT
  Filled 2013-09-26 (×27): qty 3

## 2013-09-26 NOTE — Progress Notes (Signed)
[name] "POA", Phillis Woods, has stated today (and put in writing attached to chart), that pt does have living will and she is the POA. She states that pt does not want her to "bring out the living will until the last minute". She states you can reach her at night between 11p and 7a at [phone]. She does not want the pt's brother, Milta Deiters, called as "he will be no help at all. His foot is broke and he is unable to drive".  Also, she would like to discontinue the pt's Neurontin. She states that he has been on it only for this month for "shingles" and the medication is making the pt "loopy"?

## 2013-09-26 NOTE — Progress Notes (Signed)
[facility] PROGRESS NOTE  Tan Clopper UEA:[id number] DOB: [birth year] DOA: 09/24/2013 PCP: Woody Seller, MD   Brief narrative 58 year old male with history of non-small cell lung cancer on chemotherapy (followed by Dr. Julien Nordmann), recurrent right pleural effusion status post percutaneous green, former smoker, hypertension, Stacie chronic kidney disease presented to the ED with fever and productive cough for past 2-3 days. He was seen in the oncology clinic one day prior to admission and had CT chest, abdomen and pelvis showing new versus worsening pulmonary metastatic disease, decreased right pleural effusion and decreased right-sided collapse with development of right greater than left patchy airspace disease with septal thickening suspicious for a metastatic tumor spread versus infection versus.[facility] with small to moderate left pleural effusion. CT of the abdomen through small volume abdominal and pelvic ascites with extensive omental thickening consistent with peritoneal/omental metastases. Patient on the day of admission had a fever of 100.43F, oxygen saturation in the 70s and confused transiently. His oncologist was contacted and was ask to come to the ED. In the ED patient had a white count of 16.1k, (was normal one day prior to admission) creatinine of 1.5,  chest x-ray suggestive of diffuse pulmonary infiltrates R>L suggestive of pneumonia. Patient was started on IV vancomycin and Zosyn and admitted to medical floor.  Assessment/Plan: Acute Hypoxic respiratory failure  Likely combination of Healthcare associated pneumonia, progressive lung cancer and pleural effusions On empiric vancomycin and Zosyn. Follow culture. Supportive care with Mucinex, bronchodilators and oxygen as needed -Seen by interventional radiology this morning and replaced the valve connector with new aspira connector and was ready for use for drainage. instructed nurse to drain the tube today. Patient still  requiring 5 L via [place]. Hopefully should improve after right pleural fluid drained.  -place on scheduled duoneb and prn albuterol nebs  Hypertension Stable. Continue home medications  Chronic pain Resumed home oxycodone. I would avoid ibuprofen he is on given his underlying renal disease  Transient encephalopathy Possibly related to hypoxia and underlying infection and has now resolved.  Metastatic non-small cell lung cancer Followed by Dr. Julien Nordmann. Currently on Tarceva daily for palliative chemotherapy.   Chronic kidney disease Renal function around baseline.  discontinued NSAIDs  protein calorie malnutrition, severe  seen by nutrition and added supplements    Diet: Regular with supplements  DVT prophylaxis: Subcutaneous Lovenox   Code Status: Full code Family Communication: wife at bedside Disposition Plan: Home once improved   Consultants:  IR  Procedures:  Change chest tube valve connector at bedside on 7/17  Antibiotics:  IV vancomycin and Zosyn 7/16>>  HPI/Subjective: Patient seen and examined this morning. Still requiring 5 l via [place]. Feels otherwise better.   Objective: Filed Vitals:   09/26/13 0712  BP:   Pulse:   Temp: 100.1 F (37.8 C)  Resp:     Intake/Output Summary (Last 24 hours) at 09/26/13 1349 Last data filed at 09/26/13 1030  Gross per 24 hour  Intake    590 ml  Output      0 ml  Net    590 ml   Filed Weights   09/24/13 1943 09/25/13 0500 09/26/13 0538  Weight: 68.629 kg (151 lb 4.8 oz) 68.5 kg (151 lb 0.2 oz) 67.767 kg (149 lb 6.4 oz)    Exam:   General:  Middle aged cachectic male in no acute distress  HEENT: No pallor, moist oral mucosa  Cardiovascular: Normal S1 and S2, no murmurs   Respiratory: Diminished breath sounds over right  lung, clear on left, has percutaneous drain  Abdomen: Soft, nontender, nondistended, bowel sounds present  Musculoskeletal: Warm, no edema  CNS: Alert and oriented  Data  Reviewed: Basic Metabolic Panel:  Recent Labs Lab 09/23/13 1312 09/24/13 1708 09/26/13 0459  NA 143 138  --   K 3.9 3.9  --   CL  --  100  --   CO2 31* 28  --   GLUCOSE 111 158*  --   BUN 18.8 18  --   CREATININE 1.5* 1.49* 1.74*  CALCIUM 9.1 8.7  --    Liver Function Tests:  Recent Labs Lab 09/23/13 1312  AST 21  ALT 10  ALKPHOS 67  BILITOT 0.26  PROT 5.7*  ALBUMIN 2.6*   No results found for this basename: LIPASE, AMYLASE,  in the last 168 hours No results found for this basename: AMMONIA,  in the last 168 hours CBC:  Recent Labs Lab 09/23/13 1312 09/24/13 1708 09/25/13 0455 09/26/13 0459  WBC 10.1 16.1* 21.1* 19.8*  NEUTROABS 7.9*  --   --   --   HGB 13.8 13.1 13.4 12.9*  HCT 43.8 42.1 43.6 41.3  MCV 86.5 87.7 88.4 87.5  PLT 337 273 278 321   Cardiac Enzymes: No results found for this basename: CKTOTAL, CKMB, CKMBINDEX, TROPONINI,  in the last 168 hours BNP (last 3 results)  Recent Labs  09/24/13 1637  PROBNP 221.7*   CBG: No results found for this basename: GLUCAP,  in the last 168 hours  Recent Results (from the past 240 hour(s))  CULTURE, BLOOD (ROUTINE X 2)     Status: None   Collection Time    09/24/13  5:06 PM      Result Value Ref Range Status   Specimen Description BLOOD LEFT FOREARM   Final   Special Requests BOTTLES DRAWN AEROBIC AND ANAEROBIC 5 ML   Final   Culture  Setup Time     Final   Value: 09/24/2013 22:28     Performed at [facility]   Culture     Final   Value:        BLOOD CULTURE RECEIVED NO GROWTH TO DATE CULTURE WILL BE HELD FOR 5 DAYS BEFORE ISSUING A FINAL NEGATIVE REPORT     Performed at [facility]   Report Status PENDING   Incomplete  CULTURE, BLOOD (ROUTINE X 2)     Status: None   Collection Time    09/24/13  5:06 PM      Result Value Ref Range Status   Specimen Description BLOOD RIGHT ANTECUBITAL   Final   Special Requests BOTTLES DRAWN AEROBIC AND ANAEROBIC 3 ML   Final   Culture  Setup  Time     Final   Value: 09/24/2013 22:29     Performed at [facility]   Culture     Final   Value:        BLOOD CULTURE RECEIVED NO GROWTH TO DATE CULTURE WILL BE HELD FOR 5 DAYS BEFORE ISSUING A FINAL NEGATIVE REPORT     Performed at [facility]   Report Status PENDING   Incomplete  URINE CULTURE     Status: None   Collection Time    09/24/13  5:11 PM      Result Value Ref Range Status   Specimen Description URINE, CLEAN CATCH   Final   Special Requests NONE   Final   Culture  Setup Time  Final   Value: 09/24/2013 21:06     Performed at [place]     Final   Value: NO GROWTH     Performed at [facility]   Culture     Final   Value: NO GROWTH     Performed at [facility]   Report Status 09/25/2013 FINAL   Final     Studies: Dg Chest 2 View (if Patient Has Fever And/or Copd)  09/24/2013   CLINICAL DATA:  Shortness of breath today, or recent diagnosis of pneumonia, has a chest tube, history hypertension, smoking, lung cancer  EXAM: CHEST  2 VIEW  COMPARISON:  04/10/2013 ; correlation CT chest 09/23/2013  FINDINGS: RIGHT subclavian Port-A-Cath stable with tip projecting over SVC.  RIGHT thoracostomy tubes with tips at apex and RIGHT base.  Enlargement of cardiac silhouette.  Pulmonary vascular congestion.  BILATERAL pleural effusions much greater on RIGHT.  Small loculated pneumothorax at inferior RIGHT hemi thorax.  Diffuse infiltrates throughout both lungs greater in RIGHT upper lobe, favor pneumonia.  No pneumothorax.  Pulmonary metastases identified on preceding chest CT are less well visualized.  Bones demineralized.  IMPRESSION: Diffuse pulmonary infiltrates RIGHT greater than LEFT favor pneumonia.  Pulmonary metastases seen on recent CT are less well visualized radiographically.  Persistent RIGHT hydropneumothorax despite 2 thoracostomy tubes.   Electronically Signed   By: Lavonia Dana M.D.   On: 09/24/2013 17:28   Dg  Chest Port 1 View  09/25/2013   CLINICAL DATA:  Hypoxia  EXAM: PORTABLE CHEST - 1 VIEW  COMPARISON:  Chest x-ray from yesterday  FINDINGS: No change in heart size or mediastinal contours.  There is a right IJ porta catheter which is in stable position. Right-sided small bore chest tube is also unchanged, tip at the apex.  Unchanged moderate, partially loculated right hydro pneumothorax. There is a small left pleural effusion which is unchanged. Increased interstitial coarsening throughout the lungs. Airspace disease and throughout the right lung appears similar to previous. There are peripheral ground-glass densities in the left lung. New bandlike opacity in the left lower lung.  IMPRESSION: 1. New pulmonary edema. 2. Right hydropneumothorax and left pleural effusion are stable from prior. 3. New left basilar opacity, timing favoring atelectasis.   Electronically Signed   By: Jorje Guild M.D.   On: 09/25/2013 05:12    Scheduled Meds: . amLODipine  10 mg Oral QHS  . antiseptic oral rinse  15 mL Mouth Rinse BID  . enoxaparin (LOVENOX) injection  40 mg Subcutaneous Q24H  . erlotinib  150 mg Oral Daily  . feeding supplement (ENSURE COMPLETE)  237 mL Oral TID BM  . folic acid  0.5 mg Oral Daily  . gabapentin  300 mg Oral BID  . guaiFENesin  1,200 mg Oral BID  . ipratropium-albuterol  3 mL Nebulization Q4H  . metoprolol succinate  50 mg Oral QHS  . multivitamin with minerals  1 tablet Oral Daily  . OxyCODONE  40 mg Oral TID  . piperacillin-tazobactam (ZOSYN)  IV  3.375 g Intravenous 3 times per day  . sertraline  100 mg Oral QHS  . sodium chloride  3 mL Intravenous Q12H  . vancomycin  500 mg Intravenous Q12H   Continuous Infusions:    Time spent: 25 minutes    Sativa Gelles, [place]  [facility] Pager [phone] If 7PM-7AM, please contact night-coverage at [email or web address], password [facility] 09/26/2013, 1:49 PM  LOS: 2 days

## 2013-09-27 ENCOUNTER — Inpatient Hospital Stay (HOSPITAL_COMMUNITY): Payer: Medicare Other

## 2013-09-27 LAB — CBC
HEMATOCRIT: 40.6 % (ref 39.0–52.0)
HEMOGLOBIN: 12.6 g/dL — AB (ref 13.0–17.0)
MCH: 27.2 pg (ref 26.0–34.0)
MCHC: 31 g/dL (ref 30.0–36.0)
MCV: 87.7 fL (ref 78.0–100.0)
Platelets: 312 10*3/uL (ref 150–400)
RBC: 4.63 MIL/uL (ref 4.22–5.81)
RDW: 14.2 % (ref 11.5–15.5)
WBC: 14.9 10*3/uL — AB (ref 4.0–10.5)

## 2013-09-27 LAB — BASIC METABOLIC PANEL
ANION GAP: 9 (ref 5–15)
BUN: 32 mg/dL — ABNORMAL HIGH (ref 6–23)
CHLORIDE: 97 meq/L (ref 96–112)
CO2: 34 meq/L — AB (ref 19–32)
CREATININE: 1.68 mg/dL — AB (ref 0.50–1.35)
Calcium: 8.9 mg/dL (ref 8.4–10.5)
GFR calc Af Amer: 50 mL/min — ABNORMAL LOW (ref >90)
GFR calc non Af Amer: 43 mL/min — ABNORMAL LOW (ref >90)
GLUCOSE: 139 mg/dL — AB (ref 70–99)
Potassium: 3.9 mEq/L (ref 3.7–5.3)
Sodium: 140 mEq/L (ref 137–147)

## 2013-09-27 LAB — VANCOMYCIN, TROUGH: Vancomycin Tr: 14.4 ug/mL (ref 10.0–20.0)

## 2013-09-27 MED ORDER — FUROSEMIDE 10 MG/ML IJ SOLN
40.0000 mg | Freq: Two times a day (BID) | INTRAMUSCULAR | Status: DC
  Administered 2013-09-27 – 2013-09-29 (×4): 40 mg via INTRAVENOUS
  Filled 2013-09-27 (×8): qty 4

## 2013-09-27 NOTE — Progress Notes (Signed)
[facility] PROGRESS NOTE  Wendle Kina GUR:[id number] DOB: [birth year] DOA: 09/24/2013 PCP: Woody Seller, MD   Brief narrative 58 year old male with history of non-small cell lung cancer on chemotherapy (followed by Dr. Julien Nordmann), recurrent right pleural effusion status post percutaneous green, former smoker, hypertension, Stacie chronic kidney disease presented to the ED with fever and productive cough for past 2-3 days. He was seen in the oncology clinic one day prior to admission and had CT chest, abdomen and pelvis showing new versus worsening pulmonary metastatic disease, decreased right pleural effusion and decreased right-sided collapse with development of right greater than left patchy airspace disease with septal thickening suspicious for a metastatic tumor spread versus infection versus.[facility] with small to moderate left pleural effusion. CT of the abdomen through small volume abdominal and pelvic ascites with extensive omental thickening consistent with peritoneal/omental metastases. Patient on the day of admission had a fever of 100.16F, oxygen saturation in the 70s and confused transiently. His oncologist was contacted and was ask to come to the ED. In the ED patient had a white count of 16.1k, (was normal one day prior to admission) creatinine of 1.5,  chest x-ray suggestive of diffuse pulmonary infiltrates R>L suggestive of pneumonia. Patient was started on IV vancomycin and Zosyn and admitted to medical floor.  Assessment/Plan: Acute Hypoxic respiratory failure  Likely combination of Healthcare associated pneumonia, progressive lung cancer and pleural effusions On empiric vancomycin and Zosyn. Follow cultures so far negative. Supportive care with Mucinex, bronchodilators and oxygen as needed. requiring 5 L via [place]. Repeat CXR shows pulmonary edema with bilateral effusions with right lower lobe loculated hydropneumothorax. -Seen by interventional radiology and replaced the valve  connector with new aspira connector and was ready for use for drainage. Drained only about 50 cc fluid yesterday. - will place him on bid IV lasix and request IR to assist in draining the collection. -place on scheduled duoneb and prn albuterol nebs  Hypertension Stable. Continue home medications  Chronic pain Resumed home oxycodone. I would avoid ibuprofen  given his underlying renal disease. Dced neurontin as per wife's request as it was making patient "loopy".  Transient encephalopathy Possibly related to hypoxia and underlying infection and has now resolved.  Metastatic non-small cell lung cancer Followed by Dr. Julien Nordmann. Currently on Tarceva daily for palliative chemotherapy. Will d/w Dr Julien Nordmann in am  Chronic kidney disease Renal function around baseline.  discontinued NSAIDs  protein calorie malnutrition, severe  seen by nutrition and added supplements    Diet: Regular with supplements  DVT prophylaxis: Subcutaneous Lovenox   Code Status: Full code Family Communication: wife at bedside Disposition Plan: Home once improved. May need palliative care consult if hypoxia unimproved   Consultants:  IR  Procedures:  Change chest tube valve connector at bedside on 7/17  Antibiotics:  IV vancomycin and Zosyn 7/16>>  HPI/Subjective: Patient seen and examined this morning. Still requiring 5 L via [place].   Objective: Filed Vitals:   09/27/13 0611  BP: 95/57  Pulse: 70  Temp: 98.9 F (37.2 C)  Resp: 20    Intake/Output Summary (Last 24 hours) at 09/27/13 1527 Last data filed at 09/27/13 1500  Gross per 24 hour  Intake    680 ml  Output     75 ml  Net    605 ml   Filed Weights   09/25/13 0500 09/26/13 0538 09/27/13 0611  Weight: 68.5 kg (151 lb 0.2 oz) 67.767 kg (149 lb 6.4 oz) 67.268 kg (148 lb 4.8  oz)    Exam:   General:  Middle aged cachectic male in no acute distress  HEENT: No pallor, moist oral mucosa  Cardiovascular: Normal S1 and S2, no  murmurs   Respiratory: Diminished breath sounds over right lung with crackles, clear on left, has percutaneous drain  Abdomen: Soft, nontender, nondistended, bowel sounds present  Musculoskeletal: Warm, no edema  CNS: Alert and oriented  Data Reviewed: Basic Metabolic Panel:  Recent Labs Lab 09/23/13 1312 09/24/13 1708 09/26/13 0459 09/27/13 0814  NA 143 138  --  140  K 3.9 3.9  --  3.9  CL  --  100  --  97  CO2 31* 28  --  34*  GLUCOSE 111 158*  --  139*  BUN 18.8 18  --  32*  CREATININE 1.5* 1.49* 1.74* 1.68*  CALCIUM 9.1 8.7  --  8.9   Liver Function Tests:  Recent Labs Lab 09/23/13 1312  AST 21  ALT 10  ALKPHOS 67  BILITOT 0.26  PROT 5.7*  ALBUMIN 2.6*   No results found for this basename: LIPASE, AMYLASE,  in the last 168 hours No results found for this basename: AMMONIA,  in the last 168 hours CBC:  Recent Labs Lab 09/23/13 1312 09/24/13 1708 09/25/13 0455 09/26/13 0459 09/27/13 0814  WBC 10.1 16.1* 21.1* 19.8* 14.9*  NEUTROABS 7.9*  --   --   --   --   HGB 13.8 13.1 13.4 12.9* 12.6*  HCT 43.8 42.1 43.6 41.3 40.6  MCV 86.5 87.7 88.4 87.5 87.7  PLT 337 273 278 321 312   Cardiac Enzymes: No results found for this basename: CKTOTAL, CKMB, CKMBINDEX, TROPONINI,  in the last 168 hours BNP (last 3 results)  Recent Labs  09/24/13 1637  PROBNP 221.7*   CBG: No results found for this basename: GLUCAP,  in the last 168 hours  Recent Results (from the past 240 hour(s))  CULTURE, BLOOD (ROUTINE X 2)     Status: None   Collection Time    09/24/13  5:06 PM      Result Value Ref Range Status   Specimen Description BLOOD LEFT FOREARM   Final   Special Requests BOTTLES DRAWN AEROBIC AND ANAEROBIC 5 ML   Final   Culture  Setup Time     Final   Value: 09/24/2013 22:28     Performed at [facility]   Culture     Final   Value:        BLOOD CULTURE RECEIVED NO GROWTH TO DATE CULTURE WILL BE HELD FOR 5 DAYS BEFORE ISSUING A FINAL NEGATIVE  REPORT     Performed at [facility]   Report Status PENDING   Incomplete  CULTURE, BLOOD (ROUTINE X 2)     Status: None   Collection Time    09/24/13  5:06 PM      Result Value Ref Range Status   Specimen Description BLOOD RIGHT ANTECUBITAL   Final   Special Requests BOTTLES DRAWN AEROBIC AND ANAEROBIC 3 ML   Final   Culture  Setup Time     Final   Value: 09/24/2013 22:29     Performed at [facility]   Culture     Final   Value:        BLOOD CULTURE RECEIVED NO GROWTH TO DATE CULTURE WILL BE HELD FOR 5 DAYS BEFORE ISSUING A FINAL NEGATIVE REPORT     Performed at [facility]   Report  Status PENDING   Incomplete  URINE CULTURE     Status: None   Collection Time    09/24/13  5:11 PM      Result Value Ref Range Status   Specimen Description URINE, CLEAN CATCH   Final   Special Requests NONE   Final   Culture  Setup Time     Final   Value: 09/24/2013 21:06     Performed at [place]     Final   Value: NO GROWTH     Performed at [facility]   Culture     Final   Value: NO GROWTH     Performed at [facility]   Report Status 09/25/2013 FINAL   Final     Studies: Dg Chest 2 View  09/27/2013   CLINICAL DATA:  Followup pneumonia. Right pleural effusion. History of lung carcinoma.  EXAM: CHEST  2 VIEW  COMPARISON:  09/25/2013.  FINDINGS: Bilateral pleural effusions are stable. There is hazy airspace lung opacity most evident centrally, right greater than left. This is also similar to the prior study allowing for differences in technique and patient positioning. Compare bubble at the right lateral lung base is consistent with a loculated hydro pneumothorax, without change from the prior exam.  Right-sided chest tube is stable. Stable right anterior chest wall Port-A-Cath.  IMPRESSION: 1. No significant change from the previous exam. Pulmonary edema with bilateral effusions. Right lower lung zone hydro pneumothorax,  loculated. Right chest tube is stable.   Electronically Signed   By: Lajean Manes M.D.   On: 09/27/2013 11:56    Scheduled Meds: . amLODipine  10 mg Oral QHS  . antiseptic oral rinse  15 mL Mouth Rinse BID  . enoxaparin (LOVENOX) injection  40 mg Subcutaneous Q24H  . erlotinib  150 mg Oral Daily  . feeding supplement (ENSURE COMPLETE)  237 mL Oral TID BM  . folic acid  0.5 mg Oral Daily  . guaiFENesin  1,200 mg Oral BID  . ipratropium-albuterol  3 mL Nebulization TID  . metoprolol succinate  50 mg Oral QHS  . multivitamin with minerals  1 tablet Oral Daily  . OxyCODONE  40 mg Oral TID  . piperacillin-tazobactam (ZOSYN)  IV  3.375 g Intravenous 3 times per day  . sertraline  100 mg Oral QHS  . sodium chloride  3 mL Intravenous Q12H  . vancomycin  500 mg Intravenous Q12H   Continuous Infusions:    Time spent: 25 minutes    Raela Bohl, [place]  [facility] Pager [phone] If 7PM-7AM, please contact night-coverage at [email or web address], password [facility] 09/27/2013, 3:27 PM  LOS: 3 days

## 2013-09-27 NOTE — Progress Notes (Signed)
ANTIBIOTIC CONSULT NOTE - Follow-up  Pharmacy Consult for Vancomycin/Zosyn Indication: HCAP  No Known Allergies  Patient Measurements: Height: 5\' 10"  (177.8 cm) Weight: 148 lb 4.8 oz (67.268 kg) IBW/kg (Calculated) : 73 Adjusted Body Weight:  Vital Signs: Temp: 98.9 F (37.2 C) (07/19 0611) Temp src: Oral (07/19 0611) BP: 95/57 mmHg (07/19 0611) Pulse Rate: 70 (07/19 0611) Intake/Output from previous day: 07/18 0701 - 07/19 0700 In: 360 [P.O.:360] Out: 75 [Chest Tube:75] Intake/Output from this shift:    Labs:  Recent Labs  09/24/13 1708 09/25/13 0455 09/26/13 0459  WBC 16.1* 21.1* 19.8*  HGB 13.1 13.4 12.9*  PLT 273 278 321  CREATININE 1.49*  --  1.74*   Estimated Creatinine Clearance: 43.5 ml/min (by C-G formula based on Cr of 1.74).  Recent Labs  09/27/13 0530  VANCOTROUGH 14.4     Microbiology: Recent Results (from the past 720 hour(s))  CULTURE, BLOOD (ROUTINE X 2)     Status: None   Collection Time    09/24/13  5:06 PM      Result Value Ref Range Status   Specimen Description BLOOD LEFT FOREARM   Final   Special Requests BOTTLES DRAWN AEROBIC AND ANAEROBIC 5 ML   Final   Culture  Setup Time     Final   Value: 09/24/2013 22:28     Performed at [facility]   Culture     Final   Value:        BLOOD CULTURE RECEIVED NO GROWTH TO DATE CULTURE WILL BE HELD FOR 5 DAYS BEFORE ISSUING A FINAL NEGATIVE REPORT     Performed at [facility]   Report Status PENDING   Incomplete  CULTURE, BLOOD (ROUTINE X 2)     Status: None   Collection Time    09/24/13  5:06 PM      Result Value Ref Range Status   Specimen Description BLOOD RIGHT ANTECUBITAL   Final   Special Requests BOTTLES DRAWN AEROBIC AND ANAEROBIC 3 ML   Final   Culture  Setup Time     Final   Value: 09/24/2013 22:29     Performed at [facility]   Culture     Final   Value:        BLOOD CULTURE RECEIVED NO GROWTH TO DATE CULTURE WILL BE HELD FOR 5 DAYS BEFORE ISSUING A  FINAL NEGATIVE REPORT     Performed at [facility]   Report Status PENDING   Incomplete  URINE CULTURE     Status: None   Collection Time    09/24/13  5:11 PM      Result Value Ref Range Status   Specimen Description URINE, CLEAN CATCH   Final   Special Requests NONE   Final   Culture  Setup Time     Final   Value: 09/24/2013 21:06     Performed at [facility]     Final   Value: NO GROWTH     Performed at [facility]   Culture     Final   Value: NO GROWTH     Performed at [facility]   Report Status 09/25/2013 FINAL   Final   Assessment: 34 yom with h/o lung CA on chemo, R pleural effusion s/p perc drain, HTN, former smoker, stage III CKD. Presented to ED with fever, productive cough, yellow sputum, nasal congestion/runny nose. Vanc and Zosyn ordered in ED x1 for  HCAP  7/16 >>zosyn >> 7/16 >>vanco >>   Tmax: afebrile WBCs: elevated Renal: 7/18 SCr 1.74 (slight inc) CrCl ~46N (not strict I/O so unable to determine UOP)  7/16 blood: NGTD 7/16 urine: NG  Drug level / dose changes info: 7/19: 0530 VT = 14.4 mcg/ml on vancomycin 500mg  q12h (prior to 5th maintenance dose)  Goal of Therapy:  Vancomycin trough level 15-20 mcg/ml Appropriate antibiotic dosing for renal function; eradication of infection  Plan:  Day #3 vanco/zosyn  Continue Vancomycin 500mg  IV q12h as trough acceptable, slightly below goal (slight bump in SCr 7/18am)  Continue zosyn 3.375gm IV q8h over 4h infusion  Monitor renal fx  Doreene Eland, PharmD, BCPS.   Pager: [phone] 09/27/2013 7:06 AM

## 2013-09-28 ENCOUNTER — Telehealth (HOSPITAL_COMMUNITY): Payer: Self-pay | Admitting: Interventional Radiology

## 2013-09-28 ENCOUNTER — Ambulatory Visit (HOSPITAL_COMMUNITY): Payer: Medicare Other

## 2013-09-28 NOTE — Telephone Encounter (Signed)
[name] [name] w/[employer] contacted [place] stating they recieved form from [facility] but needed clarification on the brand of pleural cath placed.  Print & faxed placement report from 07/03/13- [facility]iria Catheter. Fax # [phone] TF

## 2013-09-28 NOTE — Clinical Documentation Improvement (Signed)
  Patient with lung cancer has chest tubes for recurrent pleural effusions. Please clarify "pleural effusion" to illustrate severity of illness and risk of mortality. Thank you.  Possible Clinical Conditions? - malignant pleural effusion   - likely malignant pleural effusion  - pleural effusion - other Condition   Thank You, Ezekiel Ina ,RN Clinical Documentation Specialist:  [phone]  [place] [employer] [facility]

## 2013-09-28 NOTE — Progress Notes (Signed)
[facility] PROGRESS NOTE  [name] [name] KGU:[id number] DOB: 22-Oct-1955 DOA: 09/24/2013 PCP: Woody Seller, MD   Brief narrative 58 year old male with history of non-small cell lung cancer on chemotherapy (followed by Dr. Julien Nordmann), recurrent right pleural effusion status post percutaneous green, former smoker, hypertension, Stacie chronic kidney disease presented to the ED with fever and productive cough for past 2-3 days. He was seen in the oncology clinic one day prior to admission and had CT chest, abdomen and pelvis showing new versus worsening pulmonary metastatic disease, decreased right pleural effusion and decreased right-sided collapse with development of right greater than left patchy airspace disease with septal thickening suspicious for a metastatic tumor spread versus infection versus.[name] with small to moderate left pleural effusion. CT of the abdomen through small volume abdominal and pelvic ascites with extensive omental thickening consistent with peritoneal/omental metastases. Patient on the day of admission had a fever of 100.29F, oxygen saturation in the 70s and confused transiently. His oncologist was contacted and was ask to come to the ED. In the ED patient had a white count of 16.1k, (was normal one day prior to admission) creatinine of 1.5,  chest x-ray suggestive of diffuse pulmonary infiltrates R>L suggestive of pneumonia. Patient was started on IV vancomycin and Zosyn and admitted to medical floor.  Assessment/Plan: Acute Hypoxic respiratory failure  Likely combination of Healthcare associated pneumonia, progressive lung cancer and recuren pleural effusions On empiric vancomycin and Zosyn.  cultures so far negative. Supportive care with Mucinex, bronchodilators and oxygen as needed. requiring 5 L via [place]. Repeat CXR shows pulmonary edema with bilateral effusions with right lower lobe loculated hydropneumothorax. -Seen by interventional radiology and replaced the  valve connector with new aspira connector and was ready for use for drainage. Drained only about 75 cc fluid on 7/18. D/w IR who recommended to attempt drain with pt lying on his left to allow fluid to shift medially and be able to drain better. -placed on bid lasix on 7/19. Will continue for today -place on scheduled duoneb and prn albuterol nebs. Leucocytosis improving  Hypertension Stable. Continue home medications  Chronic pain Resumed home oxycodone. I would avoid ibuprofen  given his underlying renal disease. Dced neurontin as per wife's request as it was making patient "loopy".  Transient encephalopathy Possibly related to hypoxia and underlying infection and has now resolved.  Metastatic non-small cell lung cancer Followed by Dr. Julien Nordmann. Currently on Tarceva daily for palliative chemotherapy. D/w Dr Julien Nordmann who recommended to d/c tarceva as he plans on chemo as outpt  Chronic kidney disease Renal function around baseline.  discontinued NSAIDs  protein calorie malnutrition, severe  seen by nutrition and added supplements    Diet: Regular with supplements  DVT prophylaxis: Subcutaneous Lovenox   Code Status: Full code Family Communication: none at bedside Disposition Plan: Home once improved.    Consultants:  IR  Procedures:  Change chest tube valve connector at bedside on 7/17  Antibiotics:  IV vancomycin and Zosyn 7/16>>  HPI/Subjective: Patient seen and examined this morning. Still requiring 5 L via [place]. Was irritable with nursing staff overngiht  Objective: Filed Vitals:   09/28/13 0523  BP: 111/57  Pulse: 57  Temp: 98.5 F (36.9 C)  Resp: 18    Intake/Output Summary (Last 24 hours) at 09/28/13 1314 Last data filed at 09/28/13 1213  Gross per 24 hour  Intake   1300 ml  Output   1600 ml  Net   -300 ml   [employer]  09/26/13 0538 09/27/13 0611 09/28/13 0523  Weight: 67.767 kg (149 lb 6.4 oz) 67.268 kg (148 lb 4.8 oz) 64.501 kg (142 lb  3.2 oz)    Exam:   General:  Middle aged cachectic male in no acute distress  HEENT: No pallor, moist oral mucosa  Cardiovascular: Normal S1 and S2, no murmurs   Respiratory: improved aeration over  right lung with crackles, clear on left, has right percutaneous drain  Abdomen: Soft, nontender, nondistended, bowel sounds present  Musculoskeletal: Warm, no edema  CNS: Alert and oriented  Data Reviewed: Basic Metabolic Panel:  Recent Labs Lab 09/23/13 1312 09/24/13 1708 09/26/13 0459 09/27/13 0814  NA 143 138  --  140  K 3.9 3.9  --  3.9  CL  --  100  --  97  CO2 31* 28  --  34*  GLUCOSE 111 158*  --  139*  BUN 18.8 18  --  32*  CREATININE 1.5* 1.49* 1.74* 1.68*  CALCIUM 9.1 8.7  --  8.9   Liver Function Tests:  Recent Labs Lab 09/23/13 1312  AST 21  ALT 10  ALKPHOS 67  BILITOT 0.26  PROT 5.7*  ALBUMIN 2.6*   No results found for this basename: LIPASE, AMYLASE,  in the last 168 hours No results found for this basename: AMMONIA,  in the last 168 hours CBC:  Recent Labs Lab 09/23/13 1312 09/24/13 1708 09/25/13 0455 09/26/13 0459 09/27/13 0814  WBC 10.1 16.1* 21.1* 19.8* 14.9*  NEUTROABS 7.9*  --   --   --   --   HGB 13.8 13.1 13.4 12.9* 12.6*  HCT 43.8 42.1 43.6 41.3 40.6  MCV 86.5 87.7 88.4 87.5 87.7  PLT 337 273 278 321 312   Cardiac Enzymes: No results found for this basename: CKTOTAL, CKMB, CKMBINDEX, TROPONINI,  in the last 168 hours BNP (last 3 results)  Recent Labs  09/24/13 1637  PROBNP 221.7*   CBG: No results found for this basename: GLUCAP,  in the last 168 hours  Recent Results (from the past 240 hour(s))  CULTURE, BLOOD (ROUTINE X 2)     Status: None   Collection Time    09/24/13  5:06 PM      Result Value Ref Range Status   Specimen Description BLOOD LEFT FOREARM   Final   Special Requests BOTTLES DRAWN AEROBIC AND ANAEROBIC 5 ML   Final   Culture  Setup Time     Final   Value: 09/24/2013 22:28     Performed at [facility]   Culture     Final   Value:        BLOOD CULTURE RECEIVED NO GROWTH TO DATE CULTURE WILL BE HELD FOR 5 DAYS BEFORE ISSUING A FINAL NEGATIVE REPORT     Performed at [facility]   Report Status PENDING   Incomplete  CULTURE, BLOOD (ROUTINE X 2)     Status: None   Collection Time    09/24/13  5:06 PM      Result Value Ref Range Status   Specimen Description BLOOD RIGHT ANTECUBITAL   Final   Special Requests BOTTLES DRAWN AEROBIC AND ANAEROBIC 3 ML   Final   Culture  Setup Time     Final   Value: 09/24/2013 22:29     Performed at [facility]   Culture     Final   Value:        BLOOD CULTURE RECEIVED NO GROWTH TO  DATE CULTURE WILL BE HELD FOR 5 DAYS BEFORE ISSUING A FINAL NEGATIVE REPORT     Performed at [facility]   Report Status PENDING   Incomplete  URINE CULTURE     Status: None   Collection Time    09/24/13  5:11 PM      Result Value Ref Range Status   Specimen Description URINE, CLEAN CATCH   Final   Special Requests NONE   Final   Culture  Setup Time     Final   Value: 09/24/2013 21:06     Performed at [facility]     Final   Value: NO GROWTH     Performed at [facility]   Culture     Final   Value: NO GROWTH     Performed at [facility]   Report Status 09/25/2013 FINAL   Final     Studies: Dg Chest 2 View  09/27/2013   CLINICAL DATA:  Followup pneumonia. Right pleural effusion. History of lung carcinoma.  EXAM: CHEST  2 VIEW  COMPARISON:  09/25/2013.  FINDINGS: Bilateral pleural effusions are stable. There is hazy airspace lung opacity most evident centrally, right greater than left. This is also similar to the prior study allowing for differences in technique and patient positioning. Compare bubble at the right lateral lung base is consistent with a loculated hydro pneumothorax, without change from the prior exam.  Right-sided chest tube is stable. Stable right anterior chest wall  Port-A-Cath.  IMPRESSION: 1. No significant change from the previous exam. Pulmonary edema with bilateral effusions. Right lower lung zone hydro pneumothorax, loculated. Right chest tube is stable.   Electronically Signed   By: Lajean Manes M.D.   On: 09/27/2013 11:56    Scheduled Meds: . amLODipine  10 mg Oral QHS  . antiseptic oral rinse  15 mL Mouth Rinse BID  . enoxaparin (LOVENOX) injection  40 mg Subcutaneous Q24H  . feeding supplement (ENSURE COMPLETE)  237 mL Oral TID BM  . folic acid  0.5 mg Oral Daily  . furosemide  40 mg Intravenous BID  . guaiFENesin  1,200 mg Oral BID  . ipratropium-albuterol  3 mL Nebulization TID  . metoprolol succinate  50 mg Oral QHS  . multivitamin with minerals  1 tablet Oral Daily  . OxyCODONE  40 mg Oral TID  . piperacillin-tazobactam (ZOSYN)  IV  3.375 g Intravenous 3 times per day  . sertraline  100 mg Oral QHS  . sodium chloride  3 mL Intravenous Q12H  . vancomycin  500 mg Intravenous Q12H   Continuous Infusions:    Time spent: 25 minutes    Mirza Fessel, [place]  [facility] Pager [phone] If 7PM-7AM, please contact night-coverage at [email or web address], password [facility] 09/28/2013, 1:14 PM  LOS: 4 days

## 2013-09-29 DIAGNOSIS — E876 Hypokalemia: Secondary | ICD-10-CM

## 2013-09-29 DIAGNOSIS — D72829 Elevated white blood cell count, unspecified: Secondary | ICD-10-CM

## 2013-09-29 DIAGNOSIS — I4729 Other ventricular tachycardia: Secondary | ICD-10-CM

## 2013-09-29 DIAGNOSIS — C3430 Malignant neoplasm of lower lobe, unspecified bronchus or lung: Secondary | ICD-10-CM

## 2013-09-29 DIAGNOSIS — I472 Ventricular tachycardia, unspecified: Secondary | ICD-10-CM

## 2013-09-29 DIAGNOSIS — J910 Malignant pleural effusion: Principal | ICD-10-CM

## 2013-09-29 LAB — BLOOD GAS, ARTERIAL
Acid-Base Excess: 12.4 mmol/L — ABNORMAL HIGH (ref 0.0–2.0)
Bicarbonate: 38.1 mEq/L — ABNORMAL HIGH (ref 20.0–24.0)
DRAWN BY: 103701
O2 CONTENT: 6 L/min
O2 SAT: 90.4 %
PH ART: 7.477 — AB (ref 7.350–7.450)
PO2 ART: 57.4 mmHg — AB (ref 80.0–100.0)
Patient temperature: 98.6
TCO2: 32.8 mmol/L (ref 0–100)
pCO2 arterial: 52.2 mmHg — ABNORMAL HIGH (ref 35.0–45.0)

## 2013-09-29 LAB — MAGNESIUM: Magnesium: 1.8 mg/dL (ref 1.5–2.5)

## 2013-09-29 LAB — POTASSIUM: Potassium: 3 mEq/L — ABNORMAL LOW (ref 3.7–5.3)

## 2013-09-29 MED ORDER — POTASSIUM CHLORIDE CRYS ER 20 MEQ PO TBCR
40.0000 meq | EXTENDED_RELEASE_TABLET | ORAL | Status: AC
  Administered 2013-09-29 (×2): 40 meq via ORAL
  Filled 2013-09-29 (×2): qty 2

## 2013-09-29 MED ORDER — ALUM & MAG HYDROXIDE-SIMETH 200-200-20 MG/5ML PO SUSP: 15.0000 mL | Freq: Four times a day (QID) | ORAL | Status: DC | PRN

## 2013-09-29 MED ORDER — MAGNESIUM SULFATE 40 MG/ML IJ SOLN
2.0000 g | Freq: Once | INTRAMUSCULAR | Status: AC
  Administered 2013-09-29: 2 g via INTRAVENOUS
  Filled 2013-09-29: qty 50

## 2013-09-29 NOTE — Progress Notes (Signed)
[name] [name]   DOB:1955/07/07   GY#:[id number]   XBL#:[id number]  Subjective: 58 year old male with history of non-small cell lung cancer on chemotherapy and recurrent right pleural effusion status post percutaneous  Admitted on 7/20 by the hospitalist team after presenting to the [facility] with  2-3 days history of fever and productive cough,fever of 100.74F, oxygen saturation in the 70s and  Intermittently confused . He had no headaches or vision changes. No other symptoms were noted.   A recent CT chest, abdomen and pelvis on 7/15  revealed new versus worsening pulmonary metastatic disease, decreased right pleural effusion and decreased right-sided collapse with development of right greater than left patchy airspace disease with septal thickening suspicious for a metastatic tumor spread versus infection versus toxicity, with small to moderate left pleural effusion. CT of the abdomen through small volume abdominal and pelvic ascites with extensive omental thickening consistent with peritoneal/omental metastases.  In the ED patient had a white count of 16.1, Creatinine of 1.5  And XR was suggestive of bilateral pneumonia and pulmonary edema.  He started on IV vancomycin and Zosyn, as well as Lasix and admitted to medical floor for the treatment of acute hypoxic respiratory failure. Today, he reports feeling better, he wishes to ambulate. His overall status is improved from prior day. His fever is resolved and his cough improved. He does have periods of desats when ambulating. His appetite is decreased, but he admits not liking hospital foods. He denies any constipation. No dysuria, No bleeding issues.   PRINCIPAL DIAGNOSIS AND STAGE:  1. Metastatic non-small cell lung cancer, adenocarcinoma diagnosed in June of 2009. 2. Renal insufficiency followed by Dr. Posey Pronto. 3. History of seizure followed by Dr. Doy Mince. 4.  PRIOR THERAPY:  1. Status post 6 cycles of systemic chemotherapy with carboplatin,  Alimta, and Avastin. Last dose was given December 25, 2007, and the patient had partial response to this treatment.  2. Status post maintenance chemotherapy with Alimta 500 mg per meter squared and Avastin 15 mg/kg given every 3 weeks, status post 23 cycles. Last dose was given May 23, 2009. The patient had stable disease but the treatment was discontinued secondary to toxicity and renal insufficiency. 3. Right Pleurx catheter placement by interventional radiology on 07/03/2013                                                                                                                   CURRENT THERAPY: Tarceva 150 mg by mouth daily, started 07/04/2013. Status post approximately 1 month of therapy , s/p C1      Scheduled Meds: . amLODipine  10 mg Oral QHS  . antiseptic oral rinse  15 mL Mouth Rinse BID  . enoxaparin (LOVENOX) injection  40 mg Subcutaneous Q24H  . feeding supplement (ENSURE COMPLETE)  237 mL Oral TID BM  . folic acid  0.5 mg Oral Daily  . furosemide  40 mg Intravenous BID  . guaiFENesin  1,200 mg Oral BID  . ipratropium-albuterol  3 mL Nebulization TID  .  metoprolol succinate  50 mg Oral QHS  . multivitamin with minerals  1 tablet Oral Daily  . OxyCODONE  40 mg Oral TID  . piperacillin-tazobactam (ZOSYN)  IV  3.375 g Intravenous 3 times per day  . sertraline  100 mg Oral QHS  . sodium chloride  3 mL Intravenous Q12H  . vancomycin  500 mg Intravenous Q12H   Continuous Infusions:  PRN Meds:.acetaminophen, acetaminophen, albuterol, ALPRAZolam, fluticasone, guaiFENesin-dextromethorphan, ondansetron (ZOFRAN) IV, ondansetron, oxyCODONE, polyvinyl alcohol, zolpidem   Objective:  Filed Vitals:   09/29/13 0647  BP: 114/61  Pulse: 72  Temp: 98.3 F (36.8 C)  Resp: 20      Intake/Output Summary (Last 24 hours) at 09/29/13 0826 Last data filed at 09/29/13 0650  Gross per 24 hour  Intake    590 ml  Output   1150 ml  Net   -560 ml     GENERAL:alert, no distress  and comfortable, anxious appearing SKIN: skin color, texture, turgor are normal, no rashes or significant lesions EYES: normal, conjunctiva are pink and non-injected, sclera clear OROPHARYNX:no exudate, no erythema and lips, buccal mucosa, and tongue normal  NECK: supple, thyroid normal size, non-tender, without nodularity LYMPH:  no palpable lymphadenopathy in the cervical, axillary or inguinal LUNGS:decreased breath sounds at the bases, right greater than left.  HEART: regular rate & rhythm and no murmurs and no lower extremity edema ABDOMEN:abdomen soft, non-tender and normal bowel sounds Musculoskeletal:no cyanosis of digits and no clubbing  PSYCH: alert & oriented x 3 with fluent speech NEURO: no focal motor/sensory deficits    CBG (last 3)  No results found for this basename: GLUCAP,  in the last 72 hours   Labs:   Recent Labs Lab 09/23/13 1312 09/24/13 1708 09/25/13 0455 09/26/13 0459 09/27/13 0814  WBC 10.1 16.1* 21.1* 19.8* 14.9*  HGB 13.8 13.1 13.4 12.9* 12.6*  HCT 43.8 42.1 43.6 41.3 40.6  PLT 337 273 278 321 312  MCV 86.5 87.7 88.4 87.5 87.7  MCH 27.3 27.3 27.2 27.3 27.2  MCHC 31.5* 31.1 30.7 31.2 31.0  RDW 14.9* 14.2 14.2 14.1 14.2  LYMPHSABS 0.8*  --   --   --   --   MONOABS 0.9  --   --   --   --   EOSABS 0.5  --   --   --   --   BASOSABS 0.1  --   --   --   --      Chemistries:    Recent Labs Lab 09/23/13 1312 09/24/13 1708 09/26/13 0459 09/27/13 0814  NA 143 138  --  140  K 3.9 3.9  --  3.9  CL  --  100  --  97  CO2 31* 28  --  34*  GLUCOSE 111 158*  --  139*  BUN 18.8 18  --  32*  CREATININE 1.5* 1.49* 1.74* 1.68*  CALCIUM 9.1 8.7  --  8.9  AST 21  --   --   --   ALT 10  --   --   --   ALKPHOS 67  --   --   --   BILITOT 0.26  --   --   --     GFR Estimated Creatinine Clearance: 41.8 ml/min (by C-G formula based on Cr of 1.68).  Liver Function Tests:  Recent Labs Lab 09/23/13 1312  AST 21  ALT 10  ALKPHOS 67  BILITOT 0.26   PROT 5.7*  ALBUMIN 2.6*  Imaging Studies:  Dg Chest 2 View  09/27/2013   CLINICAL DATA:  Followup pneumonia. Right pleural effusion. History of lung carcinoma.  EXAM: CHEST  2 VIEW  COMPARISON:  09/25/2013.  FINDINGS: Bilateral pleural effusions are stable. There is hazy airspace lung opacity most evident centrally, right greater than left. This is also similar to the prior study allowing for differences in technique and patient positioning. Compare bubble at the right lateral lung base is consistent with a loculated hydro pneumothorax, without change from the prior exam.  Right-sided chest tube is stable. Stable right anterior chest wall Port-A-Cath.  IMPRESSION: 1. No significant change from the previous exam. Pulmonary edema with bilateral effusions. Right lower lung zone hydro pneumothorax, loculated. Right chest tube is stable.   Electronically Signed   By: Lajean Manes M.D.   On: 09/27/2013 11:56    Assessment/Plan: 57 y.o.   58 year old male with   1. History of non-small cell lung cancer Currently on Tarceva, cycle 1 Progression of disease is noted on CT of the chest on 7/15    2. Malignant Pleural Effusion On Pleurx catheter  3. Acute hypoxic respiratory failure In the setting of pneumonia, malignant pleural effusion and NSCLCa On IV Vanco and Zosyn, Lasix Cultures pending Ambulate with assistance, once hypoxia resolves Appreciate primary team involvement  4.  History of Malignant Ascites  5. Leukocytosis In the setting of recent Neulasta, infection On IV antibiotics Trending down. Continue to monitor  Other medical issues as per admitting team     **Disclaimer: This note was dictated with voice recognition software. Similar sounding words can inadvertently be transcribed and this note may contain transcription errors which may not have been corrected upon publication of note.Sharene Butters E, PA-C 09/29/2013  8:26 AM  ADDENDUM:  Hematology/Oncology  Attending:  The patient is seen and examined today. I agree with the above note. This is a very pleasant 58 years old white male with metastatic non-small cell lung cancer diagnosed initially in June of 2009 and has been observation after the initial induction chemotherapy and maintenance therapy for the last several years. He was recently found to have evidence for disease progression and the patient was started on treatment with single agent oral Tarceva. Unfortunately his recent CT scan of the chest, abdomen and pelvis showed evidence for disease progression with loculated right-sided pleural effusion and new left pleural effusion. The patient was admitted to [facility] on 09/28/2013 with 2-3 days history of fever and productive cough and his oxygen saturation on admission was in the 70s. He also had some confusion. He was started on treatment with Zosyn and vancomycin and is feeling much better today. He lost a lot of weight recently secondary to lack of appetite. I agree with the current treatment for pneumonia. I would recommend for the patient to have left-sided thoracentesis at some point during this admission. I would discontinue his current treatment with Tarceva at this point and I will discuss with the patient other treatment options after improvement in his condition and discharge from the hospital. I also encouraged the patient to increase his nutritional supplements. He may also benefit from PT/OT evaluation. Thank you for taking good care of Mr. Shukla, I will continue to follow up the patient with you and assist in his management on as-needed basis.

## 2013-09-29 NOTE — Progress Notes (Signed)
PT Cancellation Note  Patient Details Name: [name] [name] MRN: [id number] DOB: Jun 21, 1955   Cancelled Treatment:    Reason Eval/Treat Not Completed: Medical issues which prohibited therapy (attempted to Eval pt earlier, having ABG's drawn. Now noted abnormal values. Will check back tomorrow.)   Claretha Cooper 09/29/2013, 4:13 PM Tresa Endo PT [phone]

## 2013-09-29 NOTE — Progress Notes (Signed)
[facility] PROGRESS NOTE  [name] [name] KYH:[id number] DOB: 1956-01-27 DOA: 09/24/2013 PCP: Woody Seller, MD   Brief narrative 58 year old male with history of non-small cell lung cancer on chemotherapy (followed by Dr. Julien Nordmann), recurrent right pleural effusion status post percutaneous green, former smoker, hypertension, Stacie chronic kidney disease presented to the ED with fever and productive cough for past 2-3 days. He was seen in the oncology clinic one day prior to admission and had CT chest, abdomen and pelvis showing new versus worsening pulmonary metastatic disease, decreased right pleural effusion and decreased right-sided collapse with development of right greater than left patchy airspace disease with septal thickening suspicious for a metastatic tumor spread versus infection versus.[facility] with small to moderate left pleural effusion. CT of the abdomen through small volume abdominal and pelvic ascites with extensive omental thickening consistent with peritoneal/omental metastases. Patient on the day of admission had a fever of 100.20F, oxygen saturation in the 70s and confused transiently. His oncologist was contacted and was ask to come to the ED. In the ED patient had a white count of 16.1k, (was normal one day prior to admission) creatinine of 1.5,  chest x-ray suggestive of diffuse pulmonary infiltrates R>L suggestive of pneumonia. Patient was started on IV vancomycin and Zosyn and admitted to medical floor.  Assessment/Plan: Acute Hypoxic respiratory failure  Likely combination of Healthcare associated pneumonia, progressive lung cancer and recuren pleural effusions On empiric vancomycin and Zosyn.  cultures so far negative. Supportive care with Mucinex, bronchodilators and oxygen as needed. requiring 5 L via . Repeat CXR shows pulmonary edema with bilateral effusions with right lower lobe loculated hydropneumothorax. Arterial blood gas shows hypoxemia and  hypercarbia. -Seen by interventional radiology and replaced the valve connector with new aspira connector and was ready for use for drainage. Drained only about 75 cc fluid on 7/18. D/w IR who recommended to attempt drain with pt lying on his left to allow fluid to shift medially and be able to drain better. However was able to drain only 50 cc on 7/20. He does have a left-sided pleural effusion as well which could be contributing to hypoxia. I will request IR for left-sided therapeutic thoracentesis. -Discontinue Lasix. -place on scheduled duoneb and prn albuterol nebs. Follow WBC in a.m. -Patient has already received 5 days of IV vancomycin and Zosyn. He is afebrile with hypoxemia due to persistent pleural effusion. Will switch antibiotics to  Levaquin.  Hypertension Stable. Continue home medications  NSVT on 7/20-7/21 Patient had 5 runs of V. tach on the monitor overnight. Noted for low potassium of 3. Patient has been on Lasix as well. Replenish  oral potassium 40 meqX 2 doses, mg of 1.8 , ordered 2 gm IV magnesium sulfate. Monitor on telemetry. Repeat potassium and magnesium level in a.m.  Hypokalemia and hypomagnesemia Replenished. Recheck in a.m. Chronic pain Resumed home oxycodone. I would avoid ibuprofen  given his underlying renal disease. Dced neurontin as per wife's request as it was making patient "loopy".  Transient encephalopathy Possibly related to hypoxia and underlying infection and has now resolved.  Metastatic non-small cell lung cancer Followed by Dr. Julien Nordmann. Currently on Tarceva daily for palliative chemotherapy. D/w Dr Julien Nordmann who recommended to d/c tarceva as he plans on chemo as outpt  Chronic kidney disease Renal function around baseline.  discontinued NSAIDs  protein calorie malnutrition, severe  seen by nutrition and added supplements    Diet: Regular with supplements  DVT prophylaxis: Subcutaneous Lovenox   Code Status: Full code Family  Communication: none at bedside Disposition Plan: PT consult. Home once improved.    Consultants:  IR  Procedures:  Change chest tube valve connector at bedside on 7/17  Antibiotics:  IV vancomycin and Zosyn 7/16>>  HPI/Subjective: Patient seen and examined this morning. 5 runs of v tach noted on telemetry overnight. reports breathing to be better but still requiring 5L o2 via [place]  Objective: Filed Vitals:   09/29/13 0647  BP: 114/61  Pulse: 72  Temp: 98.3 F (36.8 C)  Resp: 20    Intake/Output Summary (Last 24 hours) at 09/29/13 1456 Last data filed at 09/29/13 0650  Gross per 24 hour  Intake    300 ml  Output    400 ml  Net   -100 ml   Filed Weights   09/28/13 0523 09/28/13 1522 09/29/13 0609  Weight: 64.501 kg (142 lb 3.2 oz) 63.73 kg (140 lb 8 oz) 62.415 kg (137 lb 9.6 oz)    Exam:   General:  Middle aged cachectic male in no acute distress  HEENT: No pallor, moist oral mucosa  Cardiovascular: Normal S1 and S2, no murmurs   Respiratory: Diminished breath sounds over bilateral lung bases ,, has right percutaneous drain  Abdomen: Soft, nontender, nondistended, bowel sounds normal  Musculoskeletal: Warm, no edema  CNS: Alert and oriented  Data Reviewed: Basic Metabolic Panel:  Recent Labs Lab 09/23/13 1312 09/24/13 1708 09/26/13 0459 09/27/13 0814 09/29/13 0830  NA 143 138  --  140  --   K 3.9 3.9  --  3.9 3.0*  CL  --  100  --  97  --   CO2 31* 28  --  34*  --   GLUCOSE 111 158*  --  139*  --   BUN 18.8 18  --  32*  --   CREATININE 1.5* 1.49* 1.74* 1.68*  --   CALCIUM 9.1 8.7  --  8.9  --   MG  --   --   --   --  1.8   Liver Function Tests:  Recent Labs Lab 09/23/13 1312  AST 21  ALT 10  ALKPHOS 67  BILITOT 0.26  PROT 5.7*  ALBUMIN 2.6*   No results found for this basename: LIPASE, AMYLASE,  in the last 168 hours No results found for this basename: AMMONIA,  in the last 168 hours CBC:  Recent Labs Lab 09/23/13 1312  09/24/13 1708 09/25/13 0455 09/26/13 0459 09/27/13 0814  WBC 10.1 16.1* 21.1* 19.8* 14.9*  NEUTROABS 7.9*  --   --   --   --   HGB 13.8 13.1 13.4 12.9* 12.6*  HCT 43.8 42.1 43.6 41.3 40.6  MCV 86.5 87.7 88.4 87.5 87.7  PLT 337 273 278 321 312   Cardiac Enzymes: No results found for this basename: CKTOTAL, CKMB, CKMBINDEX, TROPONINI,  in the last 168 hours BNP (last 3 results)  Recent Labs  09/24/13 1637  PROBNP 221.7*   CBG: No results found for this basename: GLUCAP,  in the last 168 hours  Recent Results (from the past 240 hour(s))  CULTURE, BLOOD (ROUTINE X 2)     Status: None   Collection Time    09/24/13  5:06 PM      Result Value Ref Range Status   Specimen Description BLOOD LEFT FOREARM   Final   Special Requests BOTTLES DRAWN AEROBIC AND ANAEROBIC 5 ML   Final   Culture  Setup Time     Final   Value:  09/24/2013 22:28     Performed at [facility]   Culture     Final   Value:        BLOOD CULTURE RECEIVED NO GROWTH TO DATE CULTURE WILL BE HELD FOR 5 DAYS BEFORE ISSUING A FINAL NEGATIVE REPORT     Performed at [facility]   Report Status PENDING   Incomplete  CULTURE, BLOOD (ROUTINE X 2)     Status: None   Collection Time    09/24/13  5:06 PM      Result Value Ref Range Status   Specimen Description BLOOD RIGHT ANTECUBITAL   Final   Special Requests BOTTLES DRAWN AEROBIC AND ANAEROBIC 3 ML   Final   Culture  Setup Time     Final   Value: 09/24/2013 22:29     Performed at [facility]   Culture     Final   Value:        BLOOD CULTURE RECEIVED NO GROWTH TO DATE CULTURE WILL BE HELD FOR 5 DAYS BEFORE ISSUING A FINAL NEGATIVE REPORT     Performed at [facility]   Report Status PENDING   Incomplete  URINE CULTURE     Status: None   Collection Time    09/24/13  5:11 PM      Result Value Ref Range Status   Specimen Description URINE, CLEAN CATCH   Final   Special Requests NONE   Final   Culture  Setup Time     Final   Value:  09/24/2013 21:06     Performed at [place]     Final   Value: NO GROWTH     Performed at [facility]   Culture     Final   Value: NO GROWTH     Performed at [facility]   Report Status 09/25/2013 FINAL   Final     Studies: No results found.  Scheduled Meds: . amLODipine  10 mg Oral QHS  . antiseptic oral rinse  15 mL Mouth Rinse BID  . enoxaparin (LOVENOX) injection  40 mg Subcutaneous Q24H  . feeding supplement (ENSURE COMPLETE)  237 mL Oral TID BM  . folic acid  0.5 mg Oral Daily  . guaiFENesin  1,200 mg Oral BID  . ipratropium-albuterol  3 mL Nebulization TID  . magnesium sulfate 1 - 4 g bolus IVPB  2 g Intravenous Once  . metoprolol succinate  50 mg Oral QHS  . multivitamin with minerals  1 tablet Oral Daily  . OxyCODONE  40 mg Oral TID  . piperacillin-tazobactam (ZOSYN)  IV  3.375 g Intravenous 3 times per day  . potassium chloride  40 mEq Oral Q4H  . sertraline  100 mg Oral QHS  . sodium chloride  3 mL Intravenous Q12H  . vancomycin  500 mg Intravenous Q12H   Continuous Infusions:    Time spent: 25 minutes    Kristain Filo, [place]  [facility] Pager [phone] If 7PM-7AM, please contact night-coverage at [email or web address], password [facility] 09/29/2013, 2:56 PM  LOS: 5 days

## 2013-09-29 NOTE — Progress Notes (Signed)
Report received from [name], RN. I agree with previous RN's assessment. Will continue to monitor pt closely. [name] [name] I

## 2013-09-29 NOTE — Progress Notes (Signed)
Pt had a 5 beat run of vtach. Pt asymptomatic, sleeping. VSS at this time. MD notified; Dr. Allyson Sabal ordered to check Potassium and Magnesium at this time. Will continue to monitor pt closely. Carnella [place] I

## 2013-09-30 ENCOUNTER — Ambulatory Visit: Payer: Medicare Other | Admitting: Internal Medicine

## 2013-09-30 ENCOUNTER — Inpatient Hospital Stay (HOSPITAL_COMMUNITY): Payer: Medicare Other

## 2013-09-30 LAB — CULTURE, BLOOD (ROUTINE X 2): CULTURE: NO GROWTH

## 2013-09-30 LAB — CBC
HCT: 42.1 % (ref 39.0–52.0)
HEMOGLOBIN: 13.4 g/dL (ref 13.0–17.0)
MCH: 27.2 pg (ref 26.0–34.0)
MCHC: 31.8 g/dL (ref 30.0–36.0)
MCV: 85.6 fL (ref 78.0–100.0)
Platelets: 323 10*3/uL (ref 150–400)
RBC: 4.92 MIL/uL (ref 4.22–5.81)
RDW: 14.3 % (ref 11.5–15.5)
WBC: 13.2 10*3/uL — ABNORMAL HIGH (ref 4.0–10.5)

## 2013-09-30 LAB — BASIC METABOLIC PANEL
Anion gap: 11 (ref 5–15)
BUN: 26 mg/dL — ABNORMAL HIGH (ref 6–23)
CO2: 37 mEq/L — ABNORMAL HIGH (ref 19–32)
Calcium: 9.4 mg/dL (ref 8.4–10.5)
Chloride: 96 mEq/L (ref 96–112)
Creatinine, Ser: 1.68 mg/dL — ABNORMAL HIGH (ref 0.50–1.35)
GFR, EST AFRICAN AMERICAN: 50 mL/min — AB (ref >90)
GFR, EST NON AFRICAN AMERICAN: 43 mL/min — AB (ref >90)
Glucose, Bld: 132 mg/dL — ABNORMAL HIGH (ref 70–99)
Potassium: 3.3 mEq/L — ABNORMAL LOW (ref 3.7–5.3)
SODIUM: 144 meq/L (ref 137–147)

## 2013-09-30 LAB — BODY FLUID CELL COUNT WITH DIFFERENTIAL
Lymphs, Fluid: 50 %
Monocyte-Macrophage-Serous Fluid: 17 % — ABNORMAL LOW (ref 50–90)
Neutrophil Count, Fluid: 33 % — ABNORMAL HIGH (ref 0–25)
Total Nucleated Cell Count, Fluid: 940 cu mm (ref 0–1000)

## 2013-09-30 MED ORDER — FLUTICASONE PROPIONATE 50 MCG/ACT NA SUSP
1.0000 | Freq: Every day | NASAL | Status: DC
  Administered 2013-09-30 – 2013-10-06 (×7): 1 via NASAL
  Filled 2013-09-30: qty 16

## 2013-09-30 MED ORDER — POTASSIUM CHLORIDE CRYS ER 20 MEQ PO TBCR
40.0000 meq | EXTENDED_RELEASE_TABLET | Freq: Once | ORAL | Status: AC
  Administered 2013-09-30: 40 meq via ORAL
  Filled 2013-09-30: qty 2

## 2013-09-30 MED ORDER — SALINE SPRAY 0.65 % NA SOLN
1.0000 | NASAL | Status: DC | PRN
  Administered 2013-09-30: 1 via NASAL
  Filled 2013-09-30: qty 44

## 2013-09-30 NOTE — Procedures (Signed)
Successful US guided left thoracentesis. Yielded 1.4L of amber/blood tinged fluid. Pt tolerated procedure well. No immediate complications.  Specimen was sent for labs. CXR ordered.  Ascencion Dike PA-C 09/30/2013 2:35 PM

## 2013-09-30 NOTE — Evaluation (Signed)
Physical Therapy Evaluation Patient Details Name: [name] [name] MRN: [id number] DOB: September 29, 1955 Today's Date: 09/30/2013   History of Present Illness  is a 58 y.o. male with history of lung cancer on chemotherapy, right pleural effusion status post percutaneous drain, hypertension, former smoker, stage III chronic kidney disease, presented to the ED with the above complaints  Clinical Impression  Pt  Mobilizing well. Can benefit from PT to  Address problems listed in chart.    Follow Up Recommendations No PT follow up    Equipment Recommendations  None recommended by PT    Recommendations for Other Services       Precautions / Restrictions Precautions Precaution Comments: R chest drain, monitor sats      Mobility  Bed Mobility Overal bed mobility: Independent                Transfers Overall transfer level: Needs assistance Equipment used: 1 person hand held assist Transfers: Sit to/from Stand Sit to Stand: Supervision;From elevated surface         General transfer comment: cues for safety  Ambulation/Gait Ambulation/Gait assistance: Min assist Ambulation Distance (Feet): 5 Feet Assistive device: 1 person hand held assist Gait Pattern/deviations: Step-through pattern     General Gait Details: pt did not want to walk in hall due to recent medication making him feel woozy  Stairs            Wheelchair Mobility    Modified Rankin (Stroke Patients Only)       Balance Overall balance assessment: Needs assistance Sitting-balance support: No upper extremity supported;Feet supported Sitting balance-Leahy Scale: Normal     Standing balance support: Single extremity supported Standing balance-Leahy Scale: Good                               Pertinent Vitals/Pain On 3 L, sats to 88% during mobility.     Home Living Family/patient expects to be discharged to:: Private residence Living Arrangements: Spouse/significant  other;Children Available Help at Discharge: Family Type of Home: House Home Access: Stairs to enter   Technical brewer of Steps: 3-4 Home Layout: One level Home Equipment: None      Prior Function                 Hand Dominance        Extremity/Trunk Assessment   Upper Extremity Assessment: Overall WFL for tasks assessed           Lower Extremity Assessment: Overall WFL for tasks assessed      Cervical / Trunk Assessment: Kyphotic  Communication      Cognition Arousal/Alertness: Awake/alert Behavior During Therapy: WFL for tasks assessed/performed Overall Cognitive Status: Within Functional Limits for tasks assessed                      General Comments      Exercises        Assessment/Plan    PT Assessment Patient needs continued PT services  PT Diagnosis Difficulty walking   PT Problem List Decreased strength;Decreased mobility;Cardiopulmonary status limiting activity  PT Treatment Interventions Gait training   PT Goals (Current goals can be found in the Care Plan section) Acute Rehab PT Goals Patient Stated Goal: to go home PT Goal Formulation: With patient Time For Goal Achievement: 10/14/13 Potential to Achieve Goals: Good    Frequency Min 3X/week   Barriers to discharge  Co-evaluation               End of Session   Activity Tolerance: Patient tolerated treatment well Patient left: in bed Nurse Communication: Mobility status         Time: 1710-1735 PT Time Calculation (min): 25 min   Charges:   PT Evaluation $PT Re-evaluation: 1 Procedure PT Treatments $Therapeutic Activity: 23-37 mins   PT G Codes:          Claretha Cooper 09/30/2013, 6:15 PM Tresa Endo PT [phone]

## 2013-09-30 NOTE — Progress Notes (Signed)
ANTIBIOTIC CONSULT NOTE - Follow-up  Pharmacy Consult for Vancomycin/Zosyn Indication: HCAP  No Known Allergies  Patient Measurements: Height: 5\' 10"  (177.8 cm) Weight: 139 lb 1.6 oz (63.095 kg) IBW/kg (Calculated) : 73  Vital Signs: Temp: 98.2 F (36.8 C) (07/22 0444) Temp src: Oral (07/22 0444) BP: 135/65 mmHg (07/22 0444) Pulse Rate: 69 (07/22 0444) Intake/Output from previous day: 07/21 0701 - 07/22 0700 In: 300 [IV Piggyback:300] Out: 1000 [Urine:1000] Intake/Output from this shift:    Labs:  Recent Labs  09/27/13 0814 09/30/13 0430  WBC 14.9* 13.2*  HGB 12.6* 13.4  PLT 312 323  CREATININE 1.68* 1.68*   Estimated Creatinine Clearance: 42.3 ml/min (by C-G formula based on Cr of 1.68). No results found for this basename: VANCOTROUGH, VANCOPEAK, VANCORANDOM, [place], GENTPEAK, GENTRANDOM, TOBRATROUGH, TOBRAPEAK, TOBRARND, AMIKACINPEAK, AMIKACINTROU, AMIKACIN,  in the last 72 hours   Microbiology: Recent Results (from the past 720 hour(s))  CULTURE, BLOOD (ROUTINE X 2)     Status: None   Collection Time    09/24/13  5:06 PM      Result Value Ref Range Status   Specimen Description BLOOD LEFT FOREARM   Final   Special Requests BOTTLES DRAWN AEROBIC AND ANAEROBIC 5 ML   Final   Culture  Setup Time     Final   Value: 09/24/2013 22:28     Performed at [facility]   Culture     Final   Value:        BLOOD CULTURE RECEIVED NO GROWTH TO DATE CULTURE WILL BE HELD FOR 5 DAYS BEFORE ISSUING A FINAL NEGATIVE REPORT     Performed at [facility]   Report Status PENDING   Incomplete  CULTURE, BLOOD (ROUTINE X 2)     Status: None   Collection Time    09/24/13  5:06 PM      Result Value Ref Range Status   Specimen Description BLOOD RIGHT ANTECUBITAL   Final   Special Requests BOTTLES DRAWN AEROBIC AND ANAEROBIC 3 ML   Final   Culture  Setup Time     Final   Value: 09/24/2013 22:29     Performed at [facility]   Culture     Final   Value:         BLOOD CULTURE RECEIVED NO GROWTH TO DATE CULTURE WILL BE HELD FOR 5 DAYS BEFORE ISSUING A FINAL NEGATIVE REPORT     Performed at [facility]   Report Status PENDING   Incomplete  URINE CULTURE     Status: None   Collection Time    09/24/13  5:11 PM      Result Value Ref Range Status   Specimen Description URINE, CLEAN CATCH   Final   Special Requests NONE   Final   Culture  Setup Time     Final   Value: 09/24/2013 21:06     Performed at [place]     Final   Value: NO GROWTH     Performed at [facility]   Culture     Final   Value: NO GROWTH     Performed at [facility]   Report Status 09/25/2013 FINAL   Final   Assessment: 40 yom with h/o lung CA on chemo, R pleural effusion s/p perc drain, HTN, former smoker, stage III CKD. Presented to ED with fever, productive cough, yellow sputum, nasal congestion/runny nose. Vanc and Zosyn ordered in ED  x1 for HCAP  7/16 >>zosyn >> 7/16 >>vanco >>   Tmax: afebrile WBCs: elevated but improving Renal: CKD - SCr 1.68/stable, near baseline, CrCl 42 CG / ~48N (not strict I/O so unable to determine UOP)  7/16 blood: NGTD 7/16 urine: NGF  Drug level / dose changes info: 7/19: 0530 VT = 14.4 mcg/ml on vancomycin 500mg  q12h (prior to 5th maintenance dose)  Goal of Therapy:  Vancomycin trough level 15-20 mcg/ml Appropriate antibiotic dosing for renal function; eradication of infection  Plan:  Day #6 vanco/zosyn  Continue Vancomycin 500mg  IV q12h as trough acceptable on 7/19, slightly below goal but accumulation expected with renal dysfunction  Continue zosyn 3.375gm IV q8h over 4h infusion  Per MD note 7/21, plan is to transition to levaquin - recommend 750mg  q48h for CrCl ~42 ml/min  Peggyann Juba, PharmD, BCPS Pager: [phone]  09/30/2013 8:03 AM

## 2013-09-30 NOTE — Progress Notes (Signed)
[facility] PROGRESS NOTE  [name] [name] LZJ:[id number] DOB: 1955/12/13 DOA: 09/24/2013 PCP: Woody Seller, MD   Brief narrative 58 year old male with history of non-small cell lung cancer on chemotherapy (followed by Dr. Julien Nordmann), recurrent right pleural effusion status post percutaneous drain,  former smoker, hypertension, chronic kidney disease presented to the ED with fever and productive cough for  2-3 days. He was seen in the oncology clinic one day prior to admission and had CT chest, abdomen and pelvis showing new versus worsening pulmonary metastatic disease, decreased right pleural effusion and decreased right-sided collapse with development of right greater than left patchy airspace disease with septal thickening suspicious for a metastatic tumor spread versus infection.  CT of the abdomen showed small volume abdominal and pelvic ascites with extensive omental thickening consistent with peritoneal/omental metastases. Patient on the day of admission had a fever of 100.19F, oxygen saturation in the 70s and confused transiently. His oncologist was contacted and was ask to come to the ED. In the ED patient had a white count of 16.1k, (was normal one day prior to admission) creatinine of 1.5,  chest x-ray suggestive of diffuse pulmonary infiltrates R>L suggestive of pneumonia. Patient was started on IV vancomycin and Zosyn and admitted to medical floor.  Assessment/Plan: Acute Hypoxic respiratory failure  Likely combination of Healthcare associated pneumonia, progressive lung cancer and recuren pleural effusions -cultures so far negative. Supportive care with Mucinex, bronchodilators and oxygen as needed. requiring 6 L via [place]. Repeat CXR shows pulmonary edema with bilateral effusions with right lower lobe loculated hydropneumothorax. Arterial blood gas shows hypoxemia and hypercarbia. -Seen by interventional radiology and replaced the valve connector with new aspira connector and was ready  for use for drainage.  D/w IR who recommended to attempt drain with pt lying on his left to allow fluid to shift medially and be able to drain better. -might need to resume lasix, follow renal function.  -place on scheduled duoneb and prn albuterol nebs. -Continue with  IV vancomycin and Zosyn, day 6.  -for left  side thoracentesis.   Hypertension Stable. Continue home medications  NSVT on 7/20-7/21 Patient had 5 runs of V. tach on the monitor overnight. Replete electrolytes.   Hypokalemia and hypomagnesemia 40 meq times one.  Repeat Mg in am.   Chronic pain Resumed home oxycodone. I would avoid ibuprofen  given his underlying renal disease. Dced neurontin as per wife's request as it was making patient "loopy".  Transient encephalopathy Possibly related to hypoxia and underlying infection and has now resolved.  Metastatic non-small cell lung cancer Followed by Dr. Julien Nordmann. Currently on Tarceva daily for palliative chemotherapy. D/w Dr Julien Nordmann who recommended to d/c tarceva as he plans on chemo as outpt  Chronic kidney disease Renal function around baseline.  discontinued NSAIDs Follow renal function.   protein calorie malnutrition, severe  seen by nutrition and added supplements    Diet: Regular with supplements  DVT prophylaxis: Subcutaneous Lovenox   Code Status: Full code Family Communication: none at bedside Disposition Plan: PT consult. Home once improved.    Consultants:  IR  Procedures:  Change chest tube valve connector at bedside on 7/17  Antibiotics:  IV vancomycin and Zosyn 7/16>>  HPI/Subjective: He denies worsening dyspnea. He relates nasal congestion.   Objective: Filed Vitals:   09/30/13 1437  BP: 101/64  Pulse:   Temp:   Resp:     Intake/Output Summary (Last 24 hours) at 09/30/13 1508 Last data filed at 09/30/13 0511  Gross per 24  hour  Intake    250 ml  Output   1000 ml  Net   -750 ml   Filed Weights   09/28/13 1522  09/29/13 0609 09/30/13 0554  Weight: 63.73 kg (140 lb 8 oz) 62.415 kg (137 lb 9.6 oz) 63.095 kg (139 lb 1.6 oz)    Exam:   General:  Middle aged cachectic male in no acute distress  Cardiovascular: Normal S1 and S2, no murmurs   Respiratory: Diminished breath sounds over bilateral lung bases ,, has right percutaneous drain  Abdomen: Soft, nontender, nondistended, bowel sounds normal  Musculoskeletal: Warm, no edema   Data Reviewed: Basic Metabolic Panel:  Recent Labs Lab 09/24/13 1708 09/26/13 0459 09/27/13 0814 09/29/13 0830 09/30/13 0430  NA 138  --  140  --  144  K 3.9  --  3.9 3.0* 3.3*  CL 100  --  97  --  96  CO2 28  --  34*  --  37*  GLUCOSE 158*  --  139*  --  132*  BUN 18  --  32*  --  26*  CREATININE 1.49* 1.74* 1.68*  --  1.68*  CALCIUM 8.7  --  8.9  --  9.4  MG  --   --   --  1.8  --    Liver Function Tests: No results found for this basename: AST, ALT, ALKPHOS, BILITOT, PROT, ALBUMIN,  in the last 168 hours No results found for this basename: LIPASE, AMYLASE,  in the last 168 hours No results found for this basename: AMMONIA,  in the last 168 hours CBC:  Recent Labs Lab 09/24/13 1708 09/25/13 0455 09/26/13 0459 09/27/13 0814 09/30/13 0430  WBC 16.1* 21.1* 19.8* 14.9* 13.2*  HGB 13.1 13.4 12.9* 12.6* 13.4  HCT 42.1 43.6 41.3 40.6 42.1  MCV 87.7 88.4 87.5 87.7 85.6  PLT 273 278 321 312 323   Cardiac Enzymes: No results found for this basename: CKTOTAL, CKMB, CKMBINDEX, TROPONINI,  in the last 168 hours BNP (last 3 results)  Recent Labs  09/24/13 1637  PROBNP 221.7*   CBG: No results found for this basename: GLUCAP,  in the last 168 hours  Recent Results (from the past 240 hour(s))  CULTURE, BLOOD (ROUTINE X 2)     Status: None   Collection Time    09/24/13  5:06 PM      Result Value Ref Range Status   Specimen Description BLOOD LEFT FOREARM   Final   Special Requests BOTTLES DRAWN AEROBIC AND ANAEROBIC 5 ML   Final   Culture   Setup Time     Final   Value: 09/24/2013 22:28     Performed at [facility]   Culture     Final   Value:        BLOOD CULTURE RECEIVED NO GROWTH TO DATE CULTURE WILL BE HELD FOR 5 DAYS BEFORE ISSUING A FINAL NEGATIVE REPORT     Performed at [facility]   Report Status 09/30/2013 FINAL   Final  CULTURE, BLOOD (ROUTINE X 2)     Status: None   Collection Time    09/24/13  5:06 PM      Result Value Ref Range Status   Specimen Description BLOOD RIGHT ANTECUBITAL   Final   Special Requests BOTTLES DRAWN AEROBIC AND ANAEROBIC 3 ML   Final   Culture  Setup Time     Final   Value: 09/24/2013 22:29     Performed at  [facility]     Final   Value:        BLOOD CULTURE RECEIVED NO GROWTH TO DATE CULTURE WILL BE HELD FOR 5 DAYS BEFORE ISSUING A FINAL NEGATIVE REPORT     Performed at [facility]   Report Status PENDING   Incomplete  URINE CULTURE     Status: None   Collection Time    09/24/13  5:11 PM      Result Value Ref Range Status   Specimen Description URINE, CLEAN CATCH   Final   Special Requests NONE   Final   Culture  Setup Time     Final   Value: 09/24/2013 21:06     Performed at [facility]     Final   Value: NO GROWTH     Performed at [facility]   Culture     Final   Value: NO GROWTH     Performed at [facility]   Report Status 09/25/2013 FINAL   Final     Studies: Dg Chest 1 View  09/30/2013   CLINICAL DATA:  Status post left thoracentesis.  EXAM: CHEST - 1 VIEW  COMPARISON:  PA and lateral chest 09/27/2013.  CT chest 09/23/2013.  FINDINGS: Left pleural effusion is markedly decreased after thoracentesis. No pneumothorax is identified. Port-A-Cath and right chest tube remain in place. Right pleural effusion and airspace disease do not appear markedly changed. Air-fluid levels in the right lower chest are again seen.  IMPRESSION: Marked decrease in left pleural effusion after thoracentesis.  Negative for pneumothorax.  No change in the patient's right pleural effusion with air-fluid levels identified and airspace disease in the right in the right chest.   Electronically Signed   By: Inge Rise M.D.   On: 09/30/2013 14:57    Scheduled Meds: . amLODipine  10 mg Oral QHS  . antiseptic oral rinse  15 mL Mouth Rinse BID  . enoxaparin (LOVENOX) injection  40 mg Subcutaneous Q24H  . feeding supplement (ENSURE COMPLETE)  237 mL Oral TID BM  . fluticasone  1 spray Each Nare Daily  . folic acid  0.5 mg Oral Daily  . guaiFENesin  1,200 mg Oral BID  . ipratropium-albuterol  3 mL Nebulization TID  . metoprolol succinate  50 mg Oral QHS  . multivitamin with minerals  1 tablet Oral Daily  . OxyCODONE  40 mg Oral TID  . piperacillin-tazobactam (ZOSYN)  IV  3.375 g Intravenous 3 times per day  . sertraline  100 mg Oral QHS  . sodium chloride  3 mL Intravenous Q12H  . vancomycin  500 mg Intravenous Q12H   Continuous Infusions:    Time spent: 25 minutes    Shalise Rosado A  [facility] Pager [phone] If 7PM-7AM, please contact night-coverage at [email or web address], password [facility] 09/30/2013, 3:08 PM  LOS: 6 days

## 2013-10-01 DIAGNOSIS — N183 Chronic kidney disease, stage 3 unspecified: Secondary | ICD-10-CM

## 2013-10-01 DIAGNOSIS — E43 Unspecified severe protein-calorie malnutrition: Secondary | ICD-10-CM | POA: Insufficient documentation

## 2013-10-01 HISTORY — DX: Unspecified severe protein-calorie malnutrition: E43

## 2013-10-01 LAB — CBC
HEMATOCRIT: 42.3 % (ref 39.0–52.0)
Hemoglobin: 12.9 g/dL — ABNORMAL LOW (ref 13.0–17.0)
MCH: 26.7 pg (ref 26.0–34.0)
MCHC: 30.5 g/dL (ref 30.0–36.0)
MCV: 87.4 fL (ref 78.0–100.0)
Platelets: 341 10*3/uL (ref 150–400)
RBC: 4.84 MIL/uL (ref 4.22–5.81)
RDW: 14.1 % (ref 11.5–15.5)
WBC: 15.4 10*3/uL — ABNORMAL HIGH (ref 4.0–10.5)

## 2013-10-01 LAB — BASIC METABOLIC PANEL
Anion gap: 12 (ref 5–15)
BUN: 24 mg/dL — AB (ref 6–23)
CALCIUM: 9 mg/dL (ref 8.4–10.5)
CO2: 33 mEq/L — ABNORMAL HIGH (ref 19–32)
Chloride: 96 mEq/L (ref 96–112)
Creatinine, Ser: 1.58 mg/dL — ABNORMAL HIGH (ref 0.50–1.35)
GFR calc Af Amer: 54 mL/min — ABNORMAL LOW (ref >90)
GFR calc non Af Amer: 46 mL/min — ABNORMAL LOW (ref >90)
GLUCOSE: 119 mg/dL — AB (ref 70–99)
Potassium: 3.3 mEq/L — ABNORMAL LOW (ref 3.7–5.3)
Sodium: 141 mEq/L (ref 137–147)

## 2013-10-01 LAB — PROTEIN, BODY FLUID: Total protein, fluid: 3 g/dL

## 2013-10-01 LAB — MAGNESIUM: MAGNESIUM: 2 mg/dL (ref 1.5–2.5)

## 2013-10-01 LAB — GLUCOSE, SEROUS FLUID: Glucose, Fluid: 132 mg/dL

## 2013-10-01 MED ORDER — ENSURE COMPLETE PO LIQD: 237.0000 mL | Freq: Three times a day (TID) | ORAL | Status: DC | PRN

## 2013-10-01 MED ORDER — POTASSIUM CHLORIDE CRYS ER 20 MEQ PO TBCR
40.0000 meq | EXTENDED_RELEASE_TABLET | Freq: Once | ORAL | Status: AC
  Administered 2013-10-01: 40 meq via ORAL
  Filled 2013-10-01 (×3): qty 2

## 2013-10-01 NOTE — Consult Note (Signed)
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 Not sure that he would be a candidate for more aggressive removal of fluid.  Would consider home with vanco/zosyn, am doubtful that his pleural fluid Cx will be revealing due to amt of anbx he has received, but await studies.....   Thank you so much for this interesting consult,   Bobby Rumpf (pager) [phone] [email or web address]  Valentin Benney is an 58 y.o. male.  HPI: 58 yo M with hx of lung CA, (currently on palliative chemotherapy), previous R pleural effusion (indwelling pleurex catheter since 07-03-13), CKD, came to ED on 7-16 with fever, cough and productive sputum. He was hypoxic, had temp 100.4 and transient confusion.  He had [facility] day prior in Oncology- CT chest, abdomen and pelvis which showed new versus worsening pulmonary metastatic disease, decreased right pleural effusion, decreased right-sided collapse with development of right greater than left patchy airspace disease and septal thickening suspicious for lymphangitic tumor spread versus infection versus drug toxicity and small to moderate left pleural effusion. CT abdomen showed small volume abdominal pelvic ascites and extensive omental thickening consistent with peritoneal/omental metastasis. His WBC was 16.1, he was started on vanco/zosyn. His WBC increased in first 24 but has simce improved. He was found on f/u CXR to have loculated hydropneumothorax.  On 7-22 he underwent IR drain of 1.4 L of fluid  from his L pleural space. This fluid showed 940 WBC, 33% N,   Past Medical History  Diagnosis Date  . Hypertension   . lung ca dx'd 05/2007    chemo comp 12/2009  . 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 Hx antineoplastic chemo 2011    History reviewed. No pertinent past surgical history.   No Known Allergies  Medications:  Scheduled: . amLODipine  10 mg Oral QHS  . antiseptic oral rinse  15 mL Mouth Rinse BID  . enoxaparin (LOVENOX) injection  40 mg Subcutaneous Q24H  . fluticasone  1 spray Each Nare Daily  . folic acid  0.5 mg Oral Daily  . guaiFENesin  1,200 mg Oral BID  . ipratropium-albuterol  3 mL Nebulization TID  . metoprolol succinate  50 mg Oral QHS  . multivitamin with minerals  1 tablet Oral Daily  . OxyCODONE  40 mg Oral TID  . piperacillin-tazobactam (ZOSYN)  IV  3.375 g Intravenous 3 times per day  . sertraline  100 mg Oral QHS  . sodium chloride  3 mL Intravenous Q12H  . vancomycin  500 mg Intravenous Q12H    Abtx:  Anti-infectives   Start     Dose/Rate Route Frequency Ordered Stop   09/25/13 0600  piperacillin-tazobactam (ZOSYN) IVPB 3.375 g     3.375 g 12.5 mL/hr over 240 Minutes Intravenous 3 times per day 09/24/13 2001     09/25/13 0600  vancomycin (VANCOCIN) 500 mg in sodium chloride 0.9 % 100 mL IVPB     500 mg 100 mL/hr over 60 Minutes Intravenous Every 12 hours 09/24/13 2001     09/24/13 1800  piperacillin-tazobactam (ZOSYN) IVPB 3.375 g     3.375 g 12.5 mL/hr over 240 Minutes Intravenous  Once 09/24/13 1737 09/25/13 0311   09/24/13 1745  vancomycin (VANCOCIN) IVPB 1000 mg/200 mL premix     1,000 mg 200 mL/hr over 60 Minutes Intravenous  Once 09/24/13 1737 09/24/13 2000      Total days of antibiotics: 8 vanco/zosyn           Social History:  reports that he has been smoking Cigarettes.  He has been smoking about 0.00 packs per day. He has never used smokeless tobacco. He reports that he does not drink alcohol or use  illicit drugs.  Family History  Problem Relation Age of Onset  . Cancer Brother     loose BM 2-3 /day. no further cough. polyuria due to lasix. wt loss. pain from chest tube. see HPI.   Blood pressure 102/59, pulse 75, temperature 98.3 F (36.8 C), temperature source Oral, resp. rate 18, height 5' 10"  (1.778 m), weight 61.9 kg (136 lb 7.4 oz), SpO2 97.00%. General appearance: alert, cooperative and no distress Eyes: negative findings: conjunctivae and sclerae normal and pupils equal, round, reactive to light and accomodation Throat: normal findings: lips normal without lesions, oropharynx pink & moist without lesions or evidence of thrush and poor dentition Neck: no adenopathy and supple, symmetrical, trachea midline Lungs: diminished breath sounds anterior - right Chest wall: no tenderness, port R upper chest. no fluctuance, no tenderness. R chest tube is clean, minimal erythema, no d/c.  Abdomen: normal findings: bowel sounds normal and soft, non-tender Extremities: edema none   Results for orders placed during the hospital encounter of 09/24/13 (from the past 48 hour(s))  CBC     Status: Abnormal   Collection Time    09/30/13  4:30 AM      Result Value Ref Range   WBC 13.2 (*) 4.0 - 10.5 K/uL   RBC 4.92  4.22 - 5.81 MIL/uL   Hemoglobin 13.4  13.0 - 17.0 g/dL   HCT 42.1  39.0 - 52.0 %   MCV 85.6  78.0 - 100.0 fL   MCH 27.2  26.0 - 34.0 pg   MCHC 31.8  30.0 - 36.0 g/dL   RDW 14.3  11.5 - 15.5 %   Platelets 323  150 - 400 K/uL  BASIC METABOLIC PANEL     Status: Abnormal   Collection Time    09/30/13  4:30 AM      Result Value Ref Range   Sodium 144  137 - 147 mEq/L   Potassium 3.3 (*) 3.7 - 5.3 mEq/L   Chloride 96  96 - 112 mEq/L   CO2 37 (*) 19 - 32 mEq/L   Glucose, Bld 132 (*) 70 - 99 mg/dL   BUN 26 (*) 6 - 23 mg/dL   Creatinine, Ser 1.68 (*) 0.50 - 1.35 mg/dL   Calcium 9.4  8.4 - 10.5 mg/dL   GFR calc non Af Amer 43 (*) >90 mL/min   GFR calc Af Amer 50 (*) >90  mL/min   Comment: (NOTE)     The eGFR has been calculated using the CKD EPI equation.     This calculation has not been validated in all clinical situations.     eGFR's persistently <90 mL/min signify possible Chronic Kidney     Disease.   Anion gap 11  5 - 15  BODY FLUID CELL COUNT WITH DIFFERENTIAL     Status: Abnormal   Collection Time  09/30/13  3:47 PM      Result Value Ref Range   Fluid Type-FCT PLEURAL     Comment: CORRECTED ON 07/22 AT 1722: PREVIOUSLY REPORTED AS Body Fluid   Color, Fluid YELLOW  YELLOW   Appearance, Fluid HAZY (*) CLEAR   WBC, Fluid 940  0 - 1000 cu mm   Neutrophil Count, Fluid 33 (*) 0 - 25 %   Lymphs, Fluid 50     Monocyte-Macrophage-Serous Fluid 17 (*) 50 - 90 %   Other Cells, Fluid CORRELATE WITH CYTOLOGY.    BODY FLUID CULTURE     Status: None   Collection Time    09/30/13  3:47 PM      Result Value Ref Range   Specimen Description PLEURAL     Special Requests NONE     Gram Stain       Value: MODERATE WBC PRESENT,BOTH PMN AND MONONUCLEAR     NO ORGANISMS SEEN     Performed at [facility]   Culture PENDING     Report Status PENDING    CBC     Status: Abnormal   Collection Time    10/01/13  4:30 AM      Result Value Ref Range   WBC 15.4 (*) 4.0 - 10.5 K/uL   RBC 4.84  4.22 - 5.81 MIL/uL   Hemoglobin 12.9 (*) 13.0 - 17.0 g/dL   HCT 42.3  39.0 - 52.0 %   MCV 87.4  78.0 - 100.0 fL   MCH 26.7  26.0 - 34.0 pg   MCHC 30.5  30.0 - 36.0 g/dL   RDW 14.1  11.5 - 15.5 %   Platelets 341  150 - 400 K/uL  BASIC METABOLIC PANEL     Status: Abnormal   Collection Time    10/01/13  4:30 AM      Result Value Ref Range   Sodium 141  137 - 147 mEq/L   Potassium 3.3 (*) 3.7 - 5.3 mEq/L   Chloride 96  96 - 112 mEq/L   CO2 33 (*) 19 - 32 mEq/L   Glucose, Bld 119 (*) 70 - 99 mg/dL   BUN 24 (*) 6 - 23 mg/dL   Creatinine, Ser 1.58 (*) 0.50 - 1.35 mg/dL   Calcium 9.0  8.4 - 10.5 mg/dL   GFR calc non Af Amer 46 (*) >90 mL/min   GFR calc Af Amer 54  (*) >90 mL/min   Comment: (NOTE)     The eGFR has been calculated using the CKD EPI equation.     This calculation has not been validated in all clinical situations.     eGFR's persistently <90 mL/min signify possible Chronic Kidney     Disease.   Anion gap 12  5 - 15  MAGNESIUM     Status: None   Collection Time    10/01/13  4:30 AM      Result Value Ref Range   Magnesium 2.0  1.5 - 2.5 mg/dL      Component Value Date/Time   SDES PLEURAL 09/30/2013 1547   SPECREQUEST NONE 09/30/2013 1547   CULT PENDING 09/30/2013 1547   REPTSTATUS PENDING 09/30/2013 1547   Dg Chest 1 View  09/30/2013   CLINICAL DATA:  Status post left thoracentesis.  EXAM: CHEST - 1 VIEW  COMPARISON:  PA and lateral chest 09/27/2013.  CT chest 09/23/2013.  FINDINGS: Left pleural effusion is markedly decreased after thoracentesis. No pneumothorax is identified. Port-A-Cath and right  chest tube remain in place. Right pleural effusion and airspace disease do not appear markedly changed. Air-fluid levels in the right lower chest are again seen.  IMPRESSION: Marked decrease in left pleural effusion after thoracentesis. Negative for pneumothorax.  No change in the patient's right pleural effusion with air-fluid levels identified and airspace disease in the right in the right chest.   Electronically Signed   By: Inge Rise M.D.   On: 09/30/2013 14:57   US Thoracentesis Asp Pleural Space W/img Guide  09/30/2013   CLINICAL DATA:  Metastatic lung cancer, shortness of breath. Left-sided pleural effusion. Request diagnostic and therapeutic thoracentesis.  EXAM: ULTRASOUND GUIDED LEFT THORACENTESIS  COMPARISON:  None.  PROCEDURE: An ultrasound guided thoracentesis was thoroughly discussed with the patient and questions answered. The benefits, risks, alternatives and complications were also discussed. The patient understands and wishes to proceed with the procedure. Written consent was obtained.  Ultrasound was performed to localize and  mark an adequate pocket of fluid in the left chest. The area was then prepped and draped in the normal sterile fashion. 1% Lidocaine was used for local anesthesia. Under ultrasound guidance a 19 gauge Yueh catheter was introduced. Thoracentesis was performed. The catheter was removed and a dressing applied.  Complications:  None immediate  FINDINGS: A total of approximately 1.4 L of amber/ blood-tinged fluid was removed. A fluid sample wassent for laboratory analysis.  IMPRESSION: Successful ultrasound guided left thoracentesis yielding 1.4 L of pleural fluid.  Read by: Ascencion Dike PA-C   Electronically Signed   By: Aletta Edouard M.D.   On: 09/30/2013 14:42   Recent Results (from the past 240 hour(s))  CULTURE, BLOOD (ROUTINE X 2)     Status: None   Collection Time    09/24/13  5:06 PM      Result Value Ref Range Status   Specimen Description BLOOD LEFT FOREARM   Final   Special Requests BOTTLES DRAWN AEROBIC AND ANAEROBIC 5 ML   Final   Culture  Setup Time     Final   Value: 09/24/2013 22:28     Performed at [facility]   Culture     Final   Value:        BLOOD CULTURE RECEIVED NO GROWTH TO DATE CULTURE WILL BE HELD FOR 5 DAYS BEFORE ISSUING A FINAL NEGATIVE REPORT     Performed at [facility]   Report Status 09/30/2013 FINAL   Final  CULTURE, BLOOD (ROUTINE X 2)     Status: None   Collection Time    09/24/13  5:06 PM      Result Value Ref Range Status   Specimen Description BLOOD RIGHT ANTECUBITAL   Final   Special Requests BOTTLES DRAWN AEROBIC AND ANAEROBIC 3 ML   Final   Culture  Setup Time     Final   Value: 09/24/2013 22:29     Performed at [facility]   Culture     Final   Value: NO GROWTH 5 DAYS     Note: FOR RESULT QUESTIONS PLEASE CALL MICRO AT [phone]     Performed at [facility]   Report Status 09/30/2013 FINAL   Final  URINE CULTURE     Status: None   Collection Time    09/24/13  5:11 PM      Result Value Ref Range Status    Specimen Description URINE, CLEAN CATCH   Final   Special Requests NONE  Final   Culture  Setup Time     Final   Value: 09/24/2013 21:06     Performed at [facility]     Final   Value: NO GROWTH     Performed at [facility]   Culture     Final   Value: NO GROWTH     Performed at [facility]   Report Status 09/25/2013 FINAL   Final  BODY FLUID CULTURE     Status: None   Collection Time    09/30/13  3:47 PM      Result Value Ref Range Status   Specimen Description PLEURAL   Final   Special Requests NONE   Final   Gram Stain     Final   Value: MODERATE WBC PRESENT,BOTH PMN AND MONONUCLEAR     NO ORGANISMS SEEN     Performed at [facility]   Culture PENDING   Incomplete   Report Status PENDING   Incomplete      10/01/2013, 3:20 PM     LOS: 7 days     **Disclaimer: This note may have been dictated with voice recognition software. Similar sounding words can inadvertently be transcribed and this note may contain transcription errors which may not have been corrected upon publication of note.**

## 2013-10-01 NOTE — Progress Notes (Signed)
Physical Therapy Treatment Patient Details Name: [name] [name] MRN: [id number] DOB: 02-25-1956 Today's Date: 10-29-13    History of Present Illness  58 y.o. male with history of lung cancer on chemotherapy, right pleural effusion status post percutaneous drain, hypertension, former smoker, stage III chronic kidney disease, admitted for acute hypoxic respiratory failure.    PT Comments    Pt ambulated in hallway on 2L O2 and mobilizing well.  Home oxygen qualification below.  Follow Up Recommendations  No PT follow up     Equipment Recommendations  None recommended by PT    Recommendations for Other Services       Precautions / Restrictions Precautions Precaution Comments: R chest drain, monitor sats    Mobility  Bed Mobility Overal bed mobility: Independent                Transfers Overall transfer level: Needs assistance Equipment used: None Transfers: Sit to/from Stand Sit to Stand: Supervision         General transfer comment: cues for safety with multiple lines  Ambulation/Gait Ambulation/Gait assistance: Min guard;Supervision Ambulation Distance (Feet): 800 Feet Assistive device:  (pushed IV pole) Gait Pattern/deviations: Step-through pattern     General Gait Details: no unsteady gait observed, pt denies SOB, ambulated on 2L O2 [name] [name] and SpO2 89% after 400 feet quickly increased to 95% with short break and breathing Pt also educated on breathing and appropriate SpO2 at rest and during activity.   Stairs            Wheelchair Mobility    Modified Rankin (Stroke Patients Only)       Balance                                    Cognition Arousal/Alertness: Awake/alert Behavior During Therapy: WFL for tasks assessed/performed Overall Cognitive Status: Within Functional Limits for tasks assessed                      Exercises      General Comments        Pertinent Vitals/Pain SATURATION QUALIFICATIONS: (This  note is used to comply with regulatory documentation for home oxygen)  Patient Saturations on Room Air at Rest = 85%  Patient Saturations on Room Air while Ambulating = n/a  Patient Saturations on 2 Liters of oxygen while Ambulating = 89-95%  Please briefly explain why patient needs home oxygen: to maintain saturations above 88% at rest and during activity.    Home Living                      Prior Function            PT Goals (current goals can now be found in the care plan section) Progress towards PT goals: Progressing toward goals    Frequency  Min 3X/week    PT Plan Current plan remains appropriate    Co-evaluation             End of Session   Activity Tolerance: Patient tolerated treatment well Patient left: in bed;with call bell/phone within reach     Time: 1412-1448 PT Time Calculation (min): 36 min  Charges:  $Gait Training: 23-37 mins                    G Codes:      [name] [name],[name] [name] 10-29-13, 4:08 PM [name] [name],  PT, DPT 10/01/2013 Pager: [phone]

## 2013-10-01 NOTE — Progress Notes (Signed)
Spoke with pt concerning discharge plans and choices. Pt states that he plan to discharge home with his wife, who is an Therapist, sports and his family and no HH needs. Pt is aware that if he needs Home O2, that [facility] use [facility] for DME. Pt is ok with that.

## 2013-10-01 NOTE — Progress Notes (Addendum)
[facility] PROGRESS NOTE  [name] [name] ZYS:[id number] DOB: Oct 08, 1955 DOA: 09/24/2013 PCP: Woody Seller, MD   Brief narrative 58 year old male with history of non-small cell lung cancer on chemotherapy (followed by Dr. Julien Nordmann), recurrent right pleural effusion status post percutaneous drain,  former smoker, hypertension, chronic kidney disease presented to the ED with fever and productive cough for  2-3 days. He was seen in the oncology clinic one day prior to admission and had CT chest, abdomen and pelvis showing new versus worsening pulmonary metastatic disease, decreased right pleural effusion and decreased right-sided collapse with development of right greater than left patchy airspace disease with septal thickening suspicious for a metastatic tumor spread versus infection.  CT of the abdomen showed small volume abdominal and pelvic ascites with extensive omental thickening consistent with peritoneal/omental metastases. Patient on the day of admission had a fever of 100.57F, oxygen saturation in the 70s and confused transiently. His oncologist was contacted and was ask to come to the ED. In the ED patient had a white count of 16.1k, (was normal one day prior to admission) creatinine of 1.5,  chest x-ray suggestive of diffuse pulmonary infiltrates R>L suggestive of pneumonia. Patient was started on IV vancomycin and Zosyn and admitted to medical floor.  Assessment/Plan: Acute Hypoxic respiratory failure  Likely combination of Healthcare associated pneumonia, progressive lung cancer and recuren pleural effusions -cultures so far negative. Supportive care with Mucinex, bronchodilators and oxygen as needed. requiring 3 L via [place]. Repeat CXR shows pulmonary edema with bilateral effusions with right lower lobe loculated hydropneumothorax. Arterial blood gas shows hypoxemia and hypercarbia. -Seen by interventional radiology and replaced the valve connector with new aspira connector and was ready  for use for drainage.  D/w IR who recommended to attempt drain with pt lying on his left to allow fluid to shift medially and be able to drain better. -place on scheduled duoneb and prn albuterol nebs.. Discussed with IR could repeat CT chest if worsening Hypoxemia to evaluate Right side effusion. They think this is most consistent with consolidation.  -Continue with  IV vancomycin and Zosyn, day 7.  -S/P left side thoracentesis, this has improved patient oxygen requirement.  -follow culture result.  -WBC still elevated 15. Discussed with Dr Julien Nordmann, he think increase WBC probably related to infection.  I have consulted ID to help with antibiotics options.   Hypertension Stable. Continue home medications  NSVT on 7/20-7/21 Patient had 5 runs of V. tach on the monitor overnight. Replete electrolytes.   Hypokalemia and hypomagnesemia 40 meq times one.  Mg at 2.0.   Chronic pain Resumed home oxycodone. I would avoid ibuprofen  given his underlying renal disease. Dced neurontin as per wife's request as it was making patient "loopy".  Transient encephalopathy Possibly related to hypoxia and underlying infection and has now resolved.  Metastatic non-small cell lung cancer Followed by Dr. Julien Nordmann. Currently on Tarceva daily for palliative chemotherapy. D/w Dr Julien Nordmann who recommended to d/c tarceva as he plans on chemo as outpt  Chronic kidney disease Renal function around baseline.  discontinued NSAIDs Follow renal function.   protein calorie malnutrition, severe  seen by nutrition and added supplements    Diet: Regular with supplements  DVT prophylaxis: Subcutaneous Lovenox   Code Status: Full code Family Communication: none at bedside Disposition Plan: PT consult. Home once improved.    Consultants:  IR  Procedures:  Change chest tube valve connector at bedside on 7/17  Antibiotics:  IV vancomycin and Zosyn 7/16>>  HPI/Subjective:  He is feeling better, breathing  better.   Objective: Filed Vitals:   10/01/13 0654  BP: 104/75  Pulse: 74  Temp: 97.8 F (36.6 C)  Resp: 18    Intake/Output Summary (Last 24 hours) at 10/01/13 1110 Last data filed at 10/01/13 7902  Gross per 24 hour  Intake    400 ml  Output      0 ml  Net    400 ml   Filed Weights   09/29/13 0609 09/30/13 0554 10/01/13 0654  Weight: 62.415 kg (137 lb 9.6 oz) 63.095 kg (139 lb 1.6 oz) 61.9 kg (136 lb 7.4 oz)    Exam:   General:  Middle aged cachectic male in no acute distress  Cardiovascular: Normal S1 and S2, no murmurs   Respiratory: Diminished breath sounds over bilateral lung bases ,, has right percutaneous drain  Abdomen: Soft, nontender, nondistended, bowel sounds normal  Musculoskeletal: no edema   Data Reviewed: Basic Metabolic Panel:  Recent Labs Lab 09/24/13 1708 09/26/13 0459 09/27/13 0814 09/29/13 0830 09/30/13 0430 10/01/13 0430  NA 138  --  140  --  144 141  K 3.9  --  3.9 3.0* 3.3* 3.3*  CL 100  --  97  --  96 96  CO2 28  --  34*  --  37* 33*  GLUCOSE 158*  --  139*  --  132* 119*  BUN 18  --  32*  --  26* 24*  CREATININE 1.49* 1.74* 1.68*  --  1.68* 1.58*  CALCIUM 8.7  --  8.9  --  9.4 9.0  MG  --   --   --  1.8  --  2.0   Liver Function Tests: No results found for this basename: AST, ALT, ALKPHOS, BILITOT, PROT, ALBUMIN,  in the last 168 hours No results found for this basename: LIPASE, AMYLASE,  in the last 168 hours No results found for this basename: AMMONIA,  in the last 168 hours CBC:  Recent Labs Lab 09/25/13 0455 09/26/13 0459 09/27/13 0814 09/30/13 0430 10/01/13 0430  WBC 21.1* 19.8* 14.9* 13.2* 15.4*  HGB 13.4 12.9* 12.6* 13.4 12.9*  HCT 43.6 41.3 40.6 42.1 42.3  MCV 88.4 87.5 87.7 85.6 87.4  PLT 278 321 312 323 341   Cardiac Enzymes: No results found for this basename: CKTOTAL, CKMB, CKMBINDEX, TROPONINI,  in the last 168 hours BNP (last 3 results)  Recent Labs  09/24/13 1637  PROBNP 221.7*   CBG: No  results found for this basename: GLUCAP,  in the last 168 hours  Recent Results (from the past 240 hour(s))  CULTURE, BLOOD (ROUTINE X 2)     Status: None   Collection Time    09/24/13  5:06 PM      Result Value Ref Range Status   Specimen Description BLOOD LEFT FOREARM   Final   Special Requests BOTTLES DRAWN AEROBIC AND ANAEROBIC 5 ML   Final   Culture  Setup Time     Final   Value: 09/24/2013 22:28     Performed at [facility]   Culture     Final   Value:        BLOOD CULTURE RECEIVED NO GROWTH TO DATE CULTURE WILL BE HELD FOR 5 DAYS BEFORE ISSUING A FINAL NEGATIVE REPORT     Performed at [facility]   Report Status 09/30/2013 FINAL   Final  CULTURE, BLOOD (ROUTINE X 2)     Status: None  Collection Time    09/24/13  5:06 PM      Result Value Ref Range Status   Specimen Description BLOOD RIGHT ANTECUBITAL   Final   Special Requests BOTTLES DRAWN AEROBIC AND ANAEROBIC 3 ML   Final   Culture  Setup Time     Final   Value: 09/24/2013 22:29     Performed at [facility]   Culture     Final   Value: NO GROWTH 5 DAYS     Note: FOR RESULT QUESTIONS PLEASE CALL MICRO AT [phone]     Performed at [facility]   Report Status 09/30/2013 FINAL   Final  URINE CULTURE     Status: None   Collection Time    09/24/13  5:11 PM      Result Value Ref Range Status   Specimen Description URINE, CLEAN CATCH   Final   Special Requests NONE   Final   Culture  Setup Time     Final   Value: 09/24/2013 21:06     Performed at [place]     Final   Value: NO GROWTH     Performed at [facility]   Culture     Final   Value: NO GROWTH     Performed at [facility]   Report Status 09/25/2013 FINAL   Final  BODY FLUID CULTURE     Status: None   Collection Time    09/30/13  3:47 PM      Result Value Ref Range Status   Specimen Description PLEURAL   Final   Special Requests NONE   Final   Gram Stain     Final    Value: MODERATE WBC PRESENT,BOTH PMN AND MONONUCLEAR     NO ORGANISMS SEEN     Performed at [facility]   Culture PENDING   Incomplete   Report Status PENDING   Incomplete     Studies: Dg Chest 1 View  09/30/2013   CLINICAL DATA:  Status post left thoracentesis.  EXAM: CHEST - 1 VIEW  COMPARISON:  PA and lateral chest 09/27/2013.  CT chest 09/23/2013.  FINDINGS: Left pleural effusion is markedly decreased after thoracentesis. No pneumothorax is identified. Port-A-Cath and right chest tube remain in place. Right pleural effusion and airspace disease do not appear markedly changed. Air-fluid levels in the right lower chest are again seen.  IMPRESSION: Marked decrease in left pleural effusion after thoracentesis. Negative for pneumothorax.  No change in the patient's right pleural effusion with air-fluid levels identified and airspace disease in the right in the right chest.   Electronically Signed   By: Inge Rise M.D.   On: 09/30/2013 14:57   US Thoracentesis Asp Pleural Space W/img Guide  09/30/2013   CLINICAL DATA:  Metastatic lung cancer, shortness of breath. Left-sided pleural effusion. Request diagnostic and therapeutic thoracentesis.  EXAM: ULTRASOUND GUIDED LEFT THORACENTESIS  COMPARISON:  None.  PROCEDURE: An ultrasound guided thoracentesis was thoroughly discussed with the patient and questions answered. The benefits, risks, alternatives and complications were also discussed. The patient understands and wishes to proceed with the procedure. Written consent was obtained.  Ultrasound was performed to localize and mark an adequate pocket of fluid in the left chest. The area was then prepped and draped in the normal sterile fashion. 1% Lidocaine was used for local anesthesia. Under ultrasound guidance a 19 gauge Yueh catheter was introduced. Thoracentesis was performed.  The catheter was removed and a dressing applied.  Complications:  None immediate  FINDINGS: A total of approximately  1.4 L of amber/ blood-tinged fluid was removed. A fluid sample wassent for laboratory analysis.  IMPRESSION: Successful ultrasound guided left thoracentesis yielding 1.4 L of pleural fluid.  Read by: Ascencion Dike PA-C   Electronically Signed   By: Aletta Edouard M.D.   On: 09/30/2013 14:42    Scheduled Meds: . amLODipine  10 mg Oral QHS  . antiseptic oral rinse  15 mL Mouth Rinse BID  . enoxaparin (LOVENOX) injection  40 mg Subcutaneous Q24H  . feeding supplement (ENSURE COMPLETE)  237 mL Oral TID BM  . fluticasone  1 spray Each Nare Daily  . folic acid  0.5 mg Oral Daily  . guaiFENesin  1,200 mg Oral BID  . ipratropium-albuterol  3 mL Nebulization TID  . metoprolol succinate  50 mg Oral QHS  . multivitamin with minerals  1 tablet Oral Daily  . OxyCODONE  40 mg Oral TID  . piperacillin-tazobactam (ZOSYN)  IV  3.375 g Intravenous 3 times per day  . potassium chloride  40 mEq Oral Once  . sertraline  100 mg Oral QHS  . sodium chloride  3 mL Intravenous Q12H  . vancomycin  500 mg Intravenous Q12H   Continuous Infusions:    Time spent: 25 minutes    Conley Pawling A  [facility] Pager [phone] If 7PM-7AM, please contact night-coverage at [email or web address], password [facility] 10/01/2013, 11:10 AM  LOS: 7 days

## 2013-10-01 NOTE — Progress Notes (Signed)
I agree with Tiffany, RN's assessment will assess and document any changes.  Verlon Au, RN, BSN 11:47 PM 10/01/2013

## 2013-10-01 NOTE — Progress Notes (Signed)
INITIAL NUTRITION ASSESSMENT  DOCUMENTATION CODES Per approved criteria  -Severe malnutrition in the context of chronic illness  Pt meets criteria for severe MALNUTRITION in the context of chronic illness as evidenced by 6.7% body weight loss in one month, PO intake <50% for > 5 days, moderate muscle wasting and subcutaneous fat loss.   INTERVENTION: -Recommend Ensure Plus TID (family to bring in pt's preferred supplement) -Encouraged PO intake of high kcal/protein foods -Will continue to monitor  NUTRITION DIAGNOSIS: Inadequate oral intake related to stress/anxiety/dislike of food availability as evidenced by PO intake <75% for one week.   Goal: Pt to meet >/= 90% of their estimated nutrition needs    Monitor:  Total protein/energy intake, labs, weights  Reason for Assessment: LOS/RD Discretion  58 y.o. male  Admitting Dx: HCAP (healthcare-associated pneumonia)  ASSESSMENT: [name] [name] is a 58 y.o. male with history of lung cancer on chemotherapy, right pleural effusion status post percutaneous drain, hypertension, former smoker, stage III chronic kidney disease, presented to the ED with the above complaints - fever, cough  7/17: - Pt denied any changes in appetite or weight pta. Diet recall indicates pt consumes lunch and dinner, and 3-5 Ensure Complete daily. Reported weight stable around 150 lbs. Has experienced some nausea/vomiting and thrush previously r/t to chemotherapy; however denied any current chemo-related nutrition side effects -RD to order Ensure Complete TID between meals; and encouraged continued excellent PO intake   7/23: -Pt reported minimal intake for past 7 days d/t stress/anxiety of current medical conditions and dislike of hospital foods -Family has been bringing in pt's preferred dark chocolate flavor of Ensure Plus. Will d/c current Ensure Complete supplement as pt with six-pack and multiple unopened bottles of Ensure available in room -Endorsed  feelings of weakness and has lost approximately 10 lbs. Weight method and fluids may also have contributed to differences in weight -Diet liberalized to regular from heart healthy on 7/19   Height: Ht Readings from Last 1 Encounters:  09/24/13 5\' 10"  (1.778 m)    Weight: Wt Readings from Last 1 Encounters:  10/01/13 136 lb 7.4 oz (61.9 kg)  09/26/13 149 lbs (standing weight) 09/25/13 151 lbs (bed weight)    Ideal Body Weight: 166 lbs  % Ideal Body Weight: 82%  Wt Readings from Last 10 Encounters:  10/01/13 136 lb 7.4 oz (61.9 kg)  09/02/13 149 lb 12.8 oz (67.949 kg)  08/06/13 150 lb 6.4 oz (68.221 kg)  07/20/13 158 lb 3.2 oz (71.759 kg)  06/30/13 153 lb 12.8 oz (69.763 kg)  06/09/13 156 lb 8 oz (70.988 kg)  02/19/13 161 lb (73.029 kg)  12/18/12 159 lb 4.8 oz (72.258 kg)  10/13/12 160 lb 9.6 oz (72.848 kg)  02/25/12 156 lb 12.8 oz (71.124 kg)    Usual Body Weight: 150 lbs  % Usual Body Weight: 91%  BMI:  Body mass index is 19.58 kg/(m^2).  Estimated Nutritional Needs: Kcal: 1850-2050 Protein: 80-95 gram Fluid: >/=1850 ml/daily  Skin: WDL  Diet Order: General  EDUCATION NEEDS: -No education needs identified at this time   Intake/Output Summary (Last 24 hours) at 10/01/13 1303 Last data filed at 10/01/13 8115  Gross per 24 hour  Intake    400 ml  Output      0 ml  Net    400 ml    Last BM: 7/21  Labs:   Recent Labs Lab 09/27/13 0814 09/29/13 0830 09/30/13 0430 10/01/13 0430  NA 140  --  144 141  K 3.9 3.0* 3.3* 3.3*  CL 97  --  96 96  CO2 34*  --  37* 33*  BUN 32*  --  26* 24*  CREATININE 1.68*  --  1.68* 1.58*  CALCIUM 8.9  --  9.4 9.0  MG  --  1.8  --  2.0  GLUCOSE 139*  --  132* 119*    CBG (last 3)  No results found for this basename: GLUCAP,  in the last 72 hours  Scheduled Meds: . amLODipine  10 mg Oral QHS  . antiseptic oral rinse  15 mL Mouth Rinse BID  . enoxaparin (LOVENOX) injection  40 mg Subcutaneous Q24H  . feeding  supplement (ENSURE COMPLETE)  237 mL Oral TID BM  . fluticasone  1 spray Each Nare Daily  . folic acid  0.5 mg Oral Daily  . guaiFENesin  1,200 mg Oral BID  . ipratropium-albuterol  3 mL Nebulization TID  . metoprolol succinate  50 mg Oral QHS  . multivitamin with minerals  1 tablet Oral Daily  . OxyCODONE  40 mg Oral TID  . piperacillin-tazobactam (ZOSYN)  IV  3.375 g Intravenous 3 times per day  . sertraline  100 mg Oral QHS  . sodium chloride  3 mL Intravenous Q12H  . vancomycin  500 mg Intravenous Q12H    Continuous Infusions:   Past Medical History  Diagnosis Date  . Hypertension   . lung ca dx'd 05/2007    chemo comp 12/2009  . 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 Hx antineoplastic chemo 2011    History reviewed. No pertinent past surgical history.  Atlee Abide MS RD LDN Clinical Dietitian [phone]

## 2013-10-02 ENCOUNTER — Inpatient Hospital Stay (HOSPITAL_COMMUNITY): Payer: Medicare Other

## 2013-10-02 LAB — BASIC METABOLIC PANEL
Anion gap: 9 (ref 5–15)
BUN: 18 mg/dL (ref 6–23)
CO2: 32 meq/L (ref 19–32)
CREATININE: 1.34 mg/dL (ref 0.50–1.35)
Calcium: 8.4 mg/dL (ref 8.4–10.5)
Chloride: 98 mEq/L (ref 96–112)
GFR calc non Af Amer: 56 mL/min — ABNORMAL LOW (ref >90)
GFR, EST AFRICAN AMERICAN: 65 mL/min — AB (ref >90)
Glucose, Bld: 110 mg/dL — ABNORMAL HIGH (ref 70–99)
Potassium: 3.1 mEq/L — ABNORMAL LOW (ref 3.7–5.3)
Sodium: 139 mEq/L (ref 137–147)

## 2013-10-02 LAB — CBC
HEMATOCRIT: 38.9 % — AB (ref 39.0–52.0)
Hemoglobin: 12.2 g/dL — ABNORMAL LOW (ref 13.0–17.0)
MCH: 26.8 pg (ref 26.0–34.0)
MCHC: 31.4 g/dL (ref 30.0–36.0)
MCV: 85.3 fL (ref 78.0–100.0)
Platelets: 329 10*3/uL (ref 150–400)
RBC: 4.56 MIL/uL (ref 4.22–5.81)
RDW: 13.8 % (ref 11.5–15.5)
WBC: 11.7 10*3/uL — ABNORMAL HIGH (ref 4.0–10.5)

## 2013-10-02 LAB — LACTATE DEHYDROGENASE, PLEURAL OR PERITONEAL FLUID: LD, Fluid: >3000 U/L — ABNORMAL HIGH (ref 3–23)

## 2013-10-02 LAB — BODY FLUID CELL COUNT WITH DIFFERENTIAL
Lymphs, Fluid: 3 %
Monocyte-Macrophage-Serous Fluid: 1 % — ABNORMAL LOW (ref 50–90)
Neutrophil Count, Fluid: 96 % — ABNORMAL HIGH (ref 0–25)
Total Nucleated Cell Count, Fluid: 8514 cu mm — ABNORMAL HIGH (ref 0–1000)

## 2013-10-02 LAB — PROTEIN, BODY FLUID: TOTAL PROTEIN, FLUID: 2.6 g/dL

## 2013-10-02 LAB — LACTATE DEHYDROGENASE: LDH: 196 U/L (ref 94–250)

## 2013-10-02 LAB — HIV ANTIBODY (ROUTINE TESTING W REFLEX): HIV: NONREACTIVE

## 2013-10-02 LAB — PROTEIN, TOTAL: Total Protein: 5.2 g/dL — ABNORMAL LOW (ref 6.0–8.3)

## 2013-10-02 NOTE — Progress Notes (Signed)
[facility] PROGRESS NOTE  [name] [name] YHC:[id number] DOB: 22-Jan-1956 DOA: 09/24/2013 PCP: Woody Seller, MD   Brief narrative 58 year old male with history of non-small cell lung cancer on chemotherapy (followed by Dr. Julien Nordmann), recurrent right pleural effusion status post percutaneous drain,  former smoker, hypertension, chronic kidney disease presented to the ED with fever and productive cough for  2-3 days. He was seen in the oncology clinic one day prior to admission and had CT chest, abdomen and pelvis showing new versus worsening pulmonary metastatic disease, decreased right pleural effusion and decreased right-sided collapse with development of right greater than left patchy airspace disease with septal thickening suspicious for a metastatic tumor spread versus infection.  CT of the abdomen showed small volume abdominal and pelvic ascites with extensive omental thickening consistent with peritoneal/omental metastases. Patient on the day of admission had a fever of 100.36F, oxygen saturation in the 70s and confused transiently. His oncologist was contacted and was ask to come to the ED. In the ED patient had a white count of 16.1k, (was normal one day prior to admission) creatinine of 1.5,  chest x-ray suggestive of diffuse pulmonary infiltrates R>L suggestive of pneumonia. Patient was started on IV vancomycin and Zosyn and admitted to medical floor.  Assessment/Plan: Acute Hypoxic respiratory failure: Right side Hydropneumothorax loculated, malignant left side pleural effusion.  -Likely combination of Healthcare associated pneumonia, progressive lung cancer and recuren pleural effusions -cultures so far negative. Requiring 3 L via . Repeated CXR shows pulmonary edema with bilateral effusions with right lower lobe loculated hydropneumothorax. -Seen by interventional radiology and replaced the valve connector with new aspira connector and was ready for use for drainage.  D/w IR who  recommended to attempt drain with pt lying on his left to allow fluid to shift medially and be able to drain better. -Continue with  IV vancomycin and Zosyn, day 8.  -S/P left side thoracentesis, this has improved patient oxygen requirement.   -pleural fluids left no growth to date.  -Appreciate Dr Johnnye Sima help.  -pulmonary consulted for further evaluation, care of right side hydropneumothorax , loculated,. -WBC trending down.  -repeat CT chest.   Hypertension Stable. Continue home medications  NSVT on 7/20-7/21 Patient had 5 runs of V. tach on the monitor overnight. Replete electrolytes.   Hypokalemia and hypomagnesemia 40 meq times one.  Mg at 2.0.   Chronic pain Resumed home oxycodone. I would avoid ibuprofen  given his underlying renal disease. Dced neurontin as per wife's request as it was making patient "loopy".  Transient encephalopathy Possibly related to hypoxia and underlying infection and has now resolved.  Metastatic non-small cell lung cancer Followed by Dr. Julien Nordmann. Currently on Tarceva daily for palliative chemotherapy. D/w Dr Julien Nordmann who recommended to d/c tarceva as he plans on chemo as outpt  Chronic kidney disease Renal function around baseline.  discontinued NSAIDs Follow renal function.  Improving.   protein calorie malnutrition, severe  seen by nutrition and added supplements    Diet: Regular with supplements  DVT prophylaxis: Subcutaneous Lovenox   Code Status: Full code Family Communication: none at bedside Disposition Plan: PT consult. Home once improved.    Consultants:  IR  Procedures:  Change chest tube valve connector at bedside on 7/17  Antibiotics:  IV vancomycin and Zosyn 7/16>>  HPI/Subjective: He is feeling better, breathing better. His wife was at bedside. He is breathing much better.   Objective: Filed Vitals:   10/02/13 1333  BP: 126/62  Pulse: 74  Temp: 97.6  F (36.4 C)  Resp: 20    Intake/Output Summary  (Last 24 hours) at 10/02/13 1451 Last data filed at 10/02/13 1334  Gross per 24 hour  Intake    950 ml  Output    100 ml  Net    850 ml   Filed Weights   09/30/13 0554 10/01/13 0654 10/02/13 0500  Weight: 63.095 kg (139 lb 1.6 oz) 61.9 kg (136 lb 7.4 oz) 63.5 kg (139 lb 15.9 oz)    Exam:   General:  Middle aged cachectic male in no acute distress  Cardiovascular: Normal S1 and S2, no murmurs   Respiratory: Diminished breath sounds over bilateral lung bases ,, has right percutaneous drain  Abdomen: Soft, nontender, nondistended, bowel sounds normal  Musculoskeletal: no edema   Data Reviewed: Basic Metabolic Panel:  Recent Labs Lab 09/26/13 0459 09/27/13 0814 09/29/13 0830 09/30/13 0430 10/01/13 0430 10/02/13 1215  NA  --  140  --  144 141 139  K  --  3.9 3.0* 3.3* 3.3* 3.1*  CL  --  97  --  96 96 98  CO2  --  34*  --  37* 33* 32  GLUCOSE  --  139*  --  132* 119* 110*  BUN  --  32*  --  26* 24* 18  CREATININE 1.74* 1.68*  --  1.68* 1.58* 1.34  CALCIUM  --  8.9  --  9.4 9.0 8.4  MG  --   --  1.8  --  2.0  --    Liver Function Tests:  Recent Labs Lab 10/02/13 1215  PROT 5.2*   No results found for this basename: LIPASE, AMYLASE,  in the last 168 hours No results found for this basename: AMMONIA,  in the last 168 hours CBC:  Recent Labs Lab 09/26/13 0459 09/27/13 0814 09/30/13 0430 10/01/13 0430 10/02/13 1215  WBC 19.8* 14.9* 13.2* 15.4* 11.7*  HGB 12.9* 12.6* 13.4 12.9* 12.2*  HCT 41.3 40.6 42.1 42.3 38.9*  MCV 87.5 87.7 85.6 87.4 85.3  PLT 321 312 323 341 329   Cardiac Enzymes: No results found for this basename: CKTOTAL, CKMB, CKMBINDEX, TROPONINI,  in the last 168 hours BNP (last 3 results)  Recent Labs  09/24/13 1637  PROBNP 221.7*   CBG: No results found for this basename: GLUCAP,  in the last 168 hours  Recent Results (from the past 240 hour(s))  CULTURE, BLOOD (ROUTINE X 2)     Status: None   Collection Time    09/24/13  5:06 PM       Result Value Ref Range Status   Specimen Description BLOOD LEFT FOREARM   Final   Special Requests BOTTLES DRAWN AEROBIC AND ANAEROBIC 5 ML   Final   Culture  Setup Time     Final   Value: 09/24/2013 22:28     Performed at [facility]   Culture     Final   Value:        BLOOD CULTURE RECEIVED NO GROWTH TO DATE CULTURE WILL BE HELD FOR 5 DAYS BEFORE ISSUING A FINAL NEGATIVE REPORT     Performed at [facility]   Report Status 09/30/2013 FINAL   Final  CULTURE, BLOOD (ROUTINE X 2)     Status: None   Collection Time    09/24/13  5:06 PM      Result Value Ref Range Status   Specimen Description BLOOD RIGHT ANTECUBITAL   Final   Special Requests  BOTTLES DRAWN AEROBIC AND ANAEROBIC 3 ML   Final   Culture  Setup Time     Final   Value: 09/24/2013 22:29     Performed at [facility]   Culture     Final   Value: NO GROWTH 5 DAYS     Note: FOR RESULT QUESTIONS PLEASE CALL MICRO AT [phone]     Performed at [facility]   Report Status 09/30/2013 FINAL   Final  URINE CULTURE     Status: None   Collection Time    09/24/13  5:11 PM      Result Value Ref Range Status   Specimen Description URINE, CLEAN CATCH   Final   Special Requests NONE   Final   Culture  Setup Time     Final   Value: 09/24/2013 21:06     Performed at [facility]     Final   Value: NO GROWTH     Performed at [facility]   Culture     Final   Value: NO GROWTH     Performed at [facility]   Report Status 09/25/2013 FINAL   Final  BODY FLUID CULTURE     Status: None   Collection Time    09/30/13  3:47 PM      Result Value Ref Range Status   Specimen Description PLEURAL   Final   Special Requests NONE   Final   Gram Stain     Final   Value: MODERATE WBC PRESENT,BOTH PMN AND MONONUCLEAR     NO ORGANISMS SEEN     Performed at [facility]   Culture     Final   Value: NO GROWTH 2 DAYS     Performed at [facility]    Report Status PENDING   Incomplete     Studies: Dg Chest 1 View  09/30/2013   CLINICAL DATA:  Status post left thoracentesis.  EXAM: CHEST - 1 VIEW  COMPARISON:  PA and lateral chest 09/27/2013.  CT chest 09/23/2013.  FINDINGS: Left pleural effusion is markedly decreased after thoracentesis. No pneumothorax is identified. Port-A-Cath and right chest tube remain in place. Right pleural effusion and airspace disease do not appear markedly changed. Air-fluid levels in the right lower chest are again seen.  IMPRESSION: Marked decrease in left pleural effusion after thoracentesis. Negative for pneumothorax.  No change in the patient's right pleural effusion with air-fluid levels identified and airspace disease in the right in the right chest.   Electronically Signed   By: Inge Rise M.D.   On: 09/30/2013 14:57   US Thoracentesis Asp Pleural Space W/img Guide  09/30/2013   CLINICAL DATA:  Metastatic lung cancer, shortness of breath. Left-sided pleural effusion. Request diagnostic and therapeutic thoracentesis.  EXAM: ULTRASOUND GUIDED LEFT THORACENTESIS  COMPARISON:  None.  PROCEDURE: An ultrasound guided thoracentesis was thoroughly discussed with the patient and questions answered. The benefits, risks, alternatives and complications were also discussed. The patient understands and wishes to proceed with the procedure. Written consent was obtained.  Ultrasound was performed to localize and mark an adequate pocket of fluid in the left chest. The area was then prepped and draped in the normal sterile fashion. 1% Lidocaine was used for local anesthesia. Under ultrasound guidance a 19 gauge Yueh catheter was introduced. Thoracentesis was performed. The catheter was removed and a dressing applied.  Complications:  None immediate  FINDINGS: A  total of approximately 1.4 L of amber/ blood-tinged fluid was removed. A fluid sample wassent for laboratory analysis.  IMPRESSION: Successful ultrasound guided left  thoracentesis yielding 1.4 L of pleural fluid.  Read by: Ascencion Dike PA-C   Electronically Signed   By: Aletta Edouard M.D.   On: 09/30/2013 14:42    Scheduled Meds: . amLODipine  10 mg Oral QHS  . antiseptic oral rinse  15 mL Mouth Rinse BID  . enoxaparin (LOVENOX) injection  40 mg Subcutaneous Q24H  . fluticasone  1 spray Each Nare Daily  . folic acid  0.5 mg Oral Daily  . guaiFENesin  1,200 mg Oral BID  . ipratropium-albuterol  3 mL Nebulization TID  . metoprolol succinate  50 mg Oral QHS  . multivitamin with minerals  1 tablet Oral Daily  . OxyCODONE  40 mg Oral TID  . piperacillin-tazobactam (ZOSYN)  IV  3.375 g Intravenous 3 times per day  . sertraline  100 mg Oral QHS  . sodium chloride  3 mL Intravenous Q12H  . vancomycin  500 mg Intravenous Q12H   Continuous Infusions:    Time spent: 25 minutes    Lyberti Thrush A  [facility] Pager [phone] If 7PM-7AM, please contact night-coverage at [email or web address], password [facility] 10/02/2013, 2:51 PM  LOS: 8 days

## 2013-10-02 NOTE — Progress Notes (Signed)
eLink Physician-Brief Progress Note Patient Name: [name] [name] DOB: 11/27/1955 MRN: [id number]  Date of Service  10/02/2013   HPI/Events of Note   Chart reviewed Pleural fluid studies consistent with infection (WBC near 9K, PMN predominant) CT with growing effusion   eICU Interventions  Will arrange to have pleural drainage catheter hooked up to continuous suction May need tPA/pulmozyme or streptokinase, will discuss with radiology      [place], [place] 10/02/2013, 8:20 PM

## 2013-10-02 NOTE — Progress Notes (Signed)
INFECTIOUS DISEASE PROGRESS NOTE  ID: [name] [name] is a 58 y.o. male with  Principal Problem:   HCAP (healthcare-associated pneumonia) Active Problems:   Lung cancer   Recurrent right pleural effusion   Acute respiratory failure with hypoxia   Stage III chronic kidney disease   V-tach   Hypokalemia   Protein-calorie malnutrition, severe  Subjective: Resting quietly. Upon awakening, without complaints  Abtx:  Anti-infectives   Start     Dose/Rate Route Frequency Ordered Stop   09/25/13 0600  piperacillin-tazobactam (ZOSYN) IVPB 3.375 g     3.375 g 12.5 mL/hr over 240 Minutes Intravenous 3 times per day 09/24/13 2001     09/25/13 0600  vancomycin (VANCOCIN) 500 mg in sodium chloride 0.9 % 100 mL IVPB     500 mg 100 mL/hr over 60 Minutes Intravenous Every 12 hours 09/24/13 2001     09/24/13 1800  piperacillin-tazobactam (ZOSYN) IVPB 3.375 g     3.375 g 12.5 mL/hr over 240 Minutes Intravenous  Once 09/24/13 1737 09/25/13 0311   09/24/13 1745  vancomycin (VANCOCIN) IVPB 1000 mg/200 mL premix     1,000 mg 200 mL/hr over 60 Minutes Intravenous  Once 09/24/13 1737 09/24/13 2000      Medications:  Scheduled: . amLODipine  10 mg Oral QHS  . antiseptic oral rinse  15 mL Mouth Rinse BID  . enoxaparin (LOVENOX) injection  40 mg Subcutaneous Q24H  . fluticasone  1 spray Each Nare Daily  . folic acid  0.5 mg Oral Daily  . guaiFENesin  1,200 mg Oral BID  . ipratropium-albuterol  3 mL Nebulization TID  . metoprolol succinate  50 mg Oral QHS  . multivitamin with minerals  1 tablet Oral Daily  . OxyCODONE  40 mg Oral TID  . piperacillin-tazobactam (ZOSYN)  IV  3.375 g Intravenous 3 times per day  . sertraline  100 mg Oral QHS  . sodium chloride  3 mL Intravenous Q12H  . vancomycin  500 mg Intravenous Q12H    Objective: Vital signs in last 24 hours: Temp:  [97.6 F (36.4 C)-98.3 F (36.8 C)] 97.6 F (36.4 C) (07/24 1333) Pulse Rate:  [74-89] 74 (07/24 1333) Resp:  [18-20] 20  (07/24 1333) BP: (124-129)/(62-75) 126/62 mmHg (07/24 1333) SpO2:  [92 %-97 %] 95 % (07/24 1333) Weight:  [63.5 kg (139 lb 15.9 oz)] 63.5 kg (139 lb 15.9 oz) (07/24 0500)   General appearance: alert, cooperative and no distress Resp: diminished breath sounds anterior - right Cardio: regular rate and rhythm GI: normal findings: bowel sounds normal and soft, non-tender  Lab Results  Recent Labs  10/01/13 0430 10/02/13 1215  WBC 15.4* 11.7*  HGB 12.9* 12.2*  HCT 42.3 38.9*  NA 141 139  K 3.3* 3.1*  CL 96 98  CO2 33* 32  BUN 24* 18  CREATININE 1.58* 1.34   Liver Panel  Recent Labs  10/02/13 1215  PROT 5.2*   Sedimentation Rate No results found for this basename: ESRSEDRATE,  in the last 72 hours C-Reactive Protein No results found for this basename: CRP,  in the last 72 hours  Microbiology: Recent Results (from the past 240 hour(s))  CULTURE, BLOOD (ROUTINE X 2)     Status: None   Collection Time    09/24/13  5:06 PM      Result Value Ref Range Status   Specimen Description BLOOD LEFT FOREARM   Final   Special Requests BOTTLES DRAWN AEROBIC AND ANAEROBIC 5 ML  Final   Culture  Setup Time     Final   Value: 09/24/2013 22:28     Performed at [facility]   Culture     Final   Value:        BLOOD CULTURE RECEIVED NO GROWTH TO DATE CULTURE WILL BE HELD FOR 5 DAYS BEFORE ISSUING A FINAL NEGATIVE REPORT     Performed at [facility]   Report Status 09/30/2013 FINAL   Final  CULTURE, BLOOD (ROUTINE X 2)     Status: None   Collection Time    09/24/13  5:06 PM      Result Value Ref Range Status   Specimen Description BLOOD RIGHT ANTECUBITAL   Final   Special Requests BOTTLES DRAWN AEROBIC AND ANAEROBIC 3 ML   Final   Culture  Setup Time     Final   Value: 09/24/2013 22:29     Performed at [facility]   Culture     Final   Value: NO GROWTH 5 DAYS     Note: FOR RESULT QUESTIONS PLEASE CALL MICRO AT [phone]     Performed at [facility]   Report Status 09/30/2013 FINAL   Final  URINE CULTURE     Status: None   Collection Time    09/24/13  5:11 PM      Result Value Ref Range Status   Specimen Description URINE, CLEAN CATCH   Final   Special Requests NONE   Final   Culture  Setup Time     Final   Value: 09/24/2013 21:06     Performed at [facility]     Final   Value: NO GROWTH     Performed at [facility]   Culture     Final   Value: NO GROWTH     Performed at [facility]   Report Status 09/25/2013 FINAL   Final  BODY FLUID CULTURE     Status: None   Collection Time    09/30/13  3:47 PM      Result Value Ref Range Status   Specimen Description PLEURAL   Final   Special Requests NONE   Final   Gram Stain     Final   Value: MODERATE WBC PRESENT,BOTH PMN AND MONONUCLEAR     NO ORGANISMS SEEN     Performed at [facility]   Culture     Final   Value: NO GROWTH 2 DAYS     Performed at [facility]   Report Status PENDING   Incomplete    Studies/Results: Dg Chest 1 View  09/30/2013   CLINICAL DATA:  Status post left thoracentesis.  EXAM: CHEST - 1 VIEW  COMPARISON:  PA and lateral chest 09/27/2013.  CT chest 09/23/2013.  FINDINGS: Left pleural effusion is markedly decreased after thoracentesis. No pneumothorax is identified. Port-A-Cath and right chest tube remain in place. Right pleural effusion and airspace disease do not appear markedly changed. Air-fluid levels in the right lower chest are again seen.  IMPRESSION: Marked decrease in left pleural effusion after thoracentesis. Negative for pneumothorax.  No change in the patient's right pleural effusion with air-fluid levels identified and airspace disease in the right in the right chest.   Electronically Signed   By: Inge Rise M.D.   On: 09/30/2013 14:57   US Thoracentesis Asp Pleural Space W/img Guide  09/30/2013   CLINICAL DATA:  Metastatic lung cancer, shortness of breath. Left-sided  pleural effusion. Request diagnostic and therapeutic thoracentesis.  EXAM: ULTRASOUND GUIDED LEFT THORACENTESIS  COMPARISON:  None.  PROCEDURE: An ultrasound guided thoracentesis was thoroughly discussed with the patient and questions answered. The benefits, risks, alternatives and complications were also discussed. The patient understands and wishes to proceed with the procedure. Written consent was obtained.  Ultrasound was performed to localize and mark an adequate pocket of fluid in the left chest. The area was then prepped and draped in the normal sterile fashion. 1% Lidocaine was used for local anesthesia. Under ultrasound guidance a 19 gauge Yueh catheter was introduced. Thoracentesis was performed. The catheter was removed and a dressing applied.  Complications:  None immediate  FINDINGS: A total of approximately 1.4 L of amber/ blood-tinged fluid was removed. A fluid sample wassent for laboratory analysis.  IMPRESSION: Successful ultrasound guided left thoracentesis yielding 1.4 L of pleural fluid.  Read by: Ascencion Dike PA-C   Electronically Signed   By: Aletta Edouard M.D.   On: 09/30/2013 14:42     Assessment/Plan: Hydropneumothorax, loculated  Metastatic non-small cell adenoCA of lung  CRI (GFR 56) Loose BM  Total days of antibiotics: 9 vanco/zosyn  His fluid prot and glc are not grossly notable for changes consistent with infection.  WBC better today. Await repeat CT chest.  Possible streptokinase.  Await result of C diff         Bobby Rumpf Infectious Diseases (pager) [phone] [email or web address] 10/02/2013, 2:31 PM  LOS: 8 days   **Disclaimer: This note may have been dictated with voice recognition software. Similar sounding words can inadvertently be transcribed and this note may contain transcription errors which may not have been corrected upon publication of note.**

## 2013-10-02 NOTE — Consult Note (Signed)
Name: [name] [name] MRN: [id number] DOB: 1956-03-10    ADMISSION DATE:  09/24/2013 CONSULTATION DATE:  7/24   REFERRING MD :  [name] PRIMARY SERVICE:  [facility]  CHIEF COMPLAINT:  Pleural effusion   BRIEF PATIENT DESCRIPTION:  58 year old male w/ NSCLC on palliative rx w/ Tarceva and right pleur-x. Last CT chest showed progression of disease on 7/15. Admitted w/ pneumonia +/- new infected pleural space on the right. PCCM was asked to see in regards to making recommendations about treatment options for possible complicated pleural effusion.   SIGNIFICANT EVENTS / STUDIES:  7/16 admitted w/ fever and PNA  Left thoracentesis 7/22: + malignant cells CT chest 7/24>>>  LINES / TUBES: Chronic pleur-x tube   CULTURES: Blood culture 7/22>>> Left Pleural fluid 7/22>>>  ANTIBIOTICS: Zosyn 7/16>>> vanc 7/16>>>  HISTORY OF PRESENT ILLNESS:    58 y.o. male with history of lung cancer on chemotherapy, right pleural effusion status post percutaneous drain, hypertension, former smoker, stage III chronic kidney disease, presented to the ED w/ fever, chills and pleuritic type back pain and worsening cough, productive of yellow sputum. He was seen at outpatient oncology 7/15 with CT chest, abdomen and pelvis which showed new versus worsening pulmonary metastatic disease, decreased right pleural effusion, decreased right-sided collapse with development of right greater than left patchy airspace disease and septal thickening suspicious for lymphangitic tumor spread versus infection versus drug toxicity and small to moderate left pleural effusion. CT abdomen showed small volume abdominal pelvic ascites and extensive omental thickening consistent with peritoneal/omental metastasis. He was admitted and treated for pneumonia. He underwent left thoracentesis on 7/22 which showed marked + malignant effusion. Clinically he feels better, but he continues to have leukocytosis w/ concern about now loculated pleural  process. PCCM asked to see for recommendations.     PAST MEDICAL HISTORY :  Past Medical History  Diagnosis Date  . Hypertension   . lung ca dx'd 05/2007    chemo comp 12/2009  . 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 Hx antineoplastic chemo 2011   History reviewed. No pertinent past surgical history.  Prior to Admission medications   Medication Sig Start Date End Date Taking? Authorizing Provider  ALPRAZolam Duanne Moron) 1 MG tablet Take 1 mg by mouth 3 (three) times daily as needed for anxiety.    Yes Historical Provider, MD  amLODipine (NORVASC) 10 MG tablet Take 10 mg by mouth at bedtime.    Yes Historical Provider, MD  ENSURE (ENSURE) Take 237 mLs by mouth 2 (two) times daily between meals.   Yes Historical Provider, MD  erlotinib (TARCEVA) 150 MG tablet Take 1 tablet (150 mg total) by mouth daily. Take on an empty stomach 1 hour before meals or 2 hours after. 06/30/13  Yes Curt Bears, MD  fluticasone (FLONASE) 50 MCG/ACT nasal spray Place 1 spray into both nostrils daily as needed for allergies.  11/27/12  Yes Historical Provider, MD  folic acid (FOLVITE) 335 MCG tablet Take 400 mcg by mouth daily.   Yes Historical Provider, MD  gabapentin (NEURONTIN) 300 MG capsule Take 300 mg by mouth 2 (two) times daily.   Yes Historical Provider, MD  ibuprofen (ADVIL,MOTRIN) 200 MG tablet Take 600 mg by mouth every 6 (six) hours as needed for moderate pain.   Yes Historical Provider, MD  metoprolol succinate (TOPROL-XL) 50 MG 24 hr tablet Take 50 mg by mouth at bedtime. Take with or immediately following a  meal.   Yes Historical Provider, MD  Multiple Vitamin (MULTIVITAMIN WITH MINERALS) TABS tablet Take 1 tablet by mouth daily.   Yes Historical Provider, MD  OxyCODONE (OXYCONTIN) 40 mg T12A 12 hr tablet Take 40 mg by mouth 3 (three) times daily.   Yes Historical Provider, MD  Oxycodone HCl 20 MG TABS Take 20 mg by mouth 3 (three) times daily as needed (pain).   Yes  Historical Provider, MD  polyvinyl alcohol (LIQUIFILM TEARS) 1.4 % ophthalmic solution Place 1 drop into both eyes as needed for dry eyes.   Yes Historical Provider, MD  promethazine (PHENERGAN) 25 MG suppository Place 25 mg rectally every 6 (six) hours as needed for nausea.  02/09/13  Yes Historical Provider, MD  sertraline (ZOLOFT) 100 MG tablet Take 100 mg by mouth at bedtime.    Yes Historical Provider, MD  zolpidem (AMBIEN) 10 MG tablet Take 10 mg by mouth at bedtime as needed for sleep.    Yes Historical Provider, MD   No Known Allergies  FAMILY HISTORY:  Family History  Problem Relation Age of Onset  . Cancer Brother    SOCIAL HISTORY:  reports that he has been smoking Cigarettes.  He has been smoking about 0.00 packs per day. He has never used smokeless tobacco. He reports that he does not drink alcohol or use illicit drugs.  REVIEW OF SYSTEMS:   Constitutional: Negative for fever, chills, weight loss, malaise/fatigue and diaphoresis.  HENT: Negative for hearing loss, ear pain, nosebleeds, congestion, sore throat, neck pain, tinnitus and ear discharge.   Eyes: Negative for blurred vision, double vision, photophobia, pain, discharge and redness.  Respiratory: Negative for cough, hemoptysis, sputum production, shortness of breath, wheezing and stridor.  some residual right sided CP Cardiovascular: Negative for chest pain, palpitations, orthopnea, claudication, leg swelling and PND.  Gastrointestinal: Negative for heartburn, nausea, vomiting, abdominal pain, diarrhea, constipation, blood in stool and melena.  Genitourinary: Negative for dysuria, urgency, frequency, hematuria and flank pain.  Musculoskeletal: Negative for myalgias, back pain, joint pain and falls.  Skin: Negative for itching and rash.  Neurological: Negative for dizziness, tingling, tremors, sensory change, speech change, focal weakness, seizures, loss of consciousness, weakness and headaches.  Endo/Heme/Allergies:  Negative for environmental allergies and polydipsia. Does not bruise/bleed easily.  SUBJECTIVE:  Feels better  VITAL SIGNS: Temp:  [97.6 F (36.4 C)-98.3 F (36.8 C)] 97.6 F (36.4 C) (07/24 7169) Pulse Rate:  [75-89] 83 (07/24 0633) Resp:  [18] 18 (07/24 0633) BP: (102-129)/(59-75) 129/69 mmHg (07/24 0633) SpO2:  [92 %-97 %] 96 % (07/24 6789) Weight:  [63.5 kg (139 lb 15.9 oz)] 63.5 kg (139 lb 15.9 oz) (07/24 0500) Room air  PHYSICAL EXAMINATION: General:  Chronically ill appearing white male, in no acute distress.  Neuro:  Awake, alert, no focal def   HEENT:  Poor dentition  Cardiovascular:  rrr Lungs:  Fine rales Left base, decreased on right. Left thoracic tube unremarkable  Abdomen:  Soft, nt no om  Musculoskeletal:  Intact  Skin:  intact   Recent Labs Lab 09/27/13 0814 09/29/13 0830 09/30/13 0430 10/01/13 0430  NA 140  --  144 141  K 3.9 3.0* 3.3* 3.3*  CL 97  --  96 96  CO2 34*  --  37* 33*  BUN 32*  --  26* 24*  CREATININE 1.68*  --  1.68* 1.58*  GLUCOSE 139*  --  132* 119*    Recent Labs Lab 09/27/13 0814 09/30/13 0430 10/01/13 0430  HGB 12.6* 13.4 12.9*  HCT 40.6 42.1 42.3  WBC 14.9* 13.2* 15.4*  PLT 312 323 341   Dg Chest 1 View  09/30/2013   CLINICAL DATA:  Status post left thoracentesis.  EXAM: CHEST - 1 VIEW  COMPARISON:  PA and lateral chest 09/27/2013.  CT chest 09/23/2013.  FINDINGS: Left pleural effusion is markedly decreased after thoracentesis. No pneumothorax is identified. Port-A-Cath and right chest tube remain in place. Right pleural effusion and airspace disease do not appear markedly changed. Air-fluid levels in the right lower chest are again seen.  IMPRESSION: Marked decrease in left pleural effusion after thoracentesis. Negative for pneumothorax.  No change in the patient's right pleural effusion with air-fluid levels identified and airspace disease in the right in the right chest.   Electronically Signed   By: Inge Rise M.D.    On: 09/30/2013 14:57   US Thoracentesis Asp Pleural Space W/img Guide  09/30/2013   CLINICAL DATA:  Metastatic lung cancer, shortness of breath. Left-sided pleural effusion. Request diagnostic and therapeutic thoracentesis.  EXAM: ULTRASOUND GUIDED LEFT THORACENTESIS  COMPARISON:  None.  PROCEDURE: An ultrasound guided thoracentesis was thoroughly discussed with the patient and questions answered. The benefits, risks, alternatives and complications were also discussed. The patient understands and wishes to proceed with the procedure. Written consent was obtained.  Ultrasound was performed to localize and mark an adequate pocket of fluid in the left chest. The area was then prepped and draped in the normal sterile fashion. 1% Lidocaine was used for local anesthesia. Under ultrasound guidance a 19 gauge Yueh catheter was introduced. Thoracentesis was performed. The catheter was removed and a dressing applied.  Complications:  None immediate  FINDINGS: A total of approximately 1.4 L of amber/ blood-tinged fluid was removed. A fluid sample wassent for laboratory analysis.  IMPRESSION: Successful ultrasound guided left thoracentesis yielding 1.4 L of pleural fluid.  Read by: Ascencion Dike PA-C   Electronically Signed   By: Aletta Edouard M.D.   On: 09/30/2013 14:42    ASSESSMENT / PLAN:  NSCLCA w/ radiographic evidence of progression as well as new malignant left effusion Malignant bilateral pleural effusions Pneumonia (nos) Complicated left pleural effusion. Not sure how much of this is cancer and how much is possible empyema/infection  Plan Obtain fluid samples from right pleural space and send for analysis CT (non-contrast) chest. Have spoke w/ IR. They will follow. If there is a significant amount of pleural fluid and loculation they will consider pleural streptokinase   Cont current abx. Will defer to ID re: course  Do not think he is a good candidate for VATs   Pulmonary and [place] Pager: [phone]  10/02/2013, 11:30 AM  Reviewed above, examined and agree.  58 yo male with Stage IV NSCLC complicated by b/l malignant pleural effusions.  He has persistent effusion on Rt in spite of having pleurx catheter.  Concern is that he has complicated effusion with infection.  Abx are being managed by ID.  Will get non contrast CT chest to further assess pleural space.  Have d/w IR.  Question will be whether he needs to simply continue with Abx and pleurx drainage, vs. Instilling thrombolytics through pleurx, vs. Thoracic surgery assessment for surgical intervention.  Chesley Mires, MD [facility] Pulmonary/Critical Care 10/02/2013, 3:15 PM Pager:  [phone] After 3pm call: [phone]

## 2013-10-02 NOTE — Progress Notes (Signed)
[facility] is providing the following services: Oxygen  If patient discharges after hours, please call [phone].   [name] [name] 10/02/2013, 10:17 AM

## 2013-10-03 DIAGNOSIS — C3490 Malignant neoplasm of unspecified part of unspecified bronchus or lung: Secondary | ICD-10-CM

## 2013-10-03 LAB — CBC
HCT: 41.3 % (ref 39.0–52.0)
Hemoglobin: 12.9 g/dL — ABNORMAL LOW (ref 13.0–17.0)
MCH: 27.1 pg (ref 26.0–34.0)
MCHC: 31.2 g/dL (ref 30.0–36.0)
MCV: 86.8 fL (ref 78.0–100.0)
PLATELETS: 326 10*3/uL (ref 150–400)
RBC: 4.76 MIL/uL (ref 4.22–5.81)
RDW: 13.7 % (ref 11.5–15.5)
WBC: 11.6 10*3/uL — AB (ref 4.0–10.5)

## 2013-10-03 LAB — BODY FLUID CULTURE: Culture: NO GROWTH

## 2013-10-03 LAB — BASIC METABOLIC PANEL
ANION GAP: 8 (ref 5–15)
BUN: 13 mg/dL (ref 6–23)
CHLORIDE: 98 meq/L (ref 96–112)
CO2: 34 mEq/L — ABNORMAL HIGH (ref 19–32)
CREATININE: 1.32 mg/dL (ref 0.50–1.35)
Calcium: 8.6 mg/dL (ref 8.4–10.5)
GFR calc non Af Amer: 57 mL/min — ABNORMAL LOW (ref >90)
GFR, EST AFRICAN AMERICAN: 67 mL/min — AB (ref >90)
Glucose, Bld: 111 mg/dL — ABNORMAL HIGH (ref 70–99)
Potassium: 3.2 mEq/L — ABNORMAL LOW (ref 3.7–5.3)
Sodium: 140 mEq/L (ref 137–147)

## 2013-10-03 LAB — CLOSTRIDIUM DIFFICILE BY PCR: Toxigenic C. Difficile by PCR: NEGATIVE

## 2013-10-03 LAB — VANCOMYCIN, TROUGH: VANCOMYCIN TR: 12 ug/mL (ref 10.0–20.0)

## 2013-10-03 MED ORDER — POTASSIUM CHLORIDE CRYS ER 20 MEQ PO TBCR
40.0000 meq | EXTENDED_RELEASE_TABLET | Freq: Two times a day (BID) | ORAL | Status: DC
  Administered 2013-10-04 – 2013-10-06 (×6): 40 meq via ORAL
  Filled 2013-10-03 (×7): qty 2

## 2013-10-03 MED ORDER — VANCOMYCIN HCL IN DEXTROSE 750-5 MG/150ML-% IV SOLN
750.0000 mg | Freq: Two times a day (BID) | INTRAVENOUS | Status: DC
  Administered 2013-10-03 – 2013-10-06 (×5): 750 mg via INTRAVENOUS
  Filled 2013-10-03 (×7): qty 150

## 2013-10-03 MED ORDER — ALTEPLASE 100 MG IV SOLR
5.0000 mg | Freq: Once | INTRAVENOUS | Status: DC
Start: 2013-10-03 — End: 2013-10-03

## 2013-10-03 MED ORDER — ALTEPLASE 2 MG IJ SOLR
5.0000 mg | Freq: Once | INTRAMUSCULAR | Status: AC
Start: 2013-10-03 — End: 2013-10-03
  Administered 2013-10-03: 5 mg
  Filled 2013-10-03: qty 6

## 2013-10-03 NOTE — Progress Notes (Signed)
ANTIBIOTIC CONSULT NOTE - Follow-up  Pharmacy Consult for Vancomycin/Zosyn Indication: HCAP  No Known Allergies  Patient Measurements: Height: 5\' 10"  (177.8 cm) Weight: 141 lb 1.6 oz (64.003 kg) IBW/kg (Calculated) : 73  Vital Signs: Temp: 98.4 F (36.9 C) (07/25 0511) Temp src: Oral (07/25 0511) BP: 134/62 mmHg (07/25 0511) Pulse Rate: 82 (07/25 0511) Intake/Output from previous day: 07/24 0701 - 07/25 0700 In: 5027 [P.O.:1320; IV Piggyback:150] Out: 720 [Urine:650; Chest Tube:70] Intake/Output from this shift:    Labs:  Recent Labs  10/01/13 0430 10/02/13 1215 10/03/13 0505  WBC 15.4* 11.7* 11.6*  HGB 12.9* 12.2* 12.9*  PLT 341 329 326  CREATININE 1.58* 1.34 1.32   Estimated Creatinine Clearance: 54.5 ml/min (by C-G formula based on Cr of 1.32).  Recent Labs  10/03/13 0505  [place] 12.0     Microbiology: Recent Results (from the past 720 hour(s))  CULTURE, BLOOD (ROUTINE X 2)     Status: None   Collection Time    09/24/13  5:06 PM      Result Value Ref Range Status   Specimen Description BLOOD LEFT FOREARM   Final   Special Requests BOTTLES DRAWN AEROBIC AND ANAEROBIC 5 ML   Final   Culture  Setup Time     Final   Value: 09/24/2013 22:28     Performed at [facility]   Culture     Final   Value:        BLOOD CULTURE RECEIVED NO GROWTH TO DATE CULTURE WILL BE HELD FOR 5 DAYS BEFORE ISSUING A FINAL NEGATIVE REPORT     Performed at [facility]   Report Status 09/30/2013 FINAL   Final  CULTURE, BLOOD (ROUTINE X 2)     Status: None   Collection Time    09/24/13  5:06 PM      Result Value Ref Range Status   Specimen Description BLOOD RIGHT ANTECUBITAL   Final   Special Requests BOTTLES DRAWN AEROBIC AND ANAEROBIC 3 ML   Final   Culture  Setup Time     Final   Value: 09/24/2013 22:29     Performed at [facility]   Culture     Final   Value: NO GROWTH 5 DAYS     Note: FOR RESULT QUESTIONS PLEASE CALL MICRO AT [phone]      Performed at [facility]   Report Status 09/30/2013 FINAL   Final  URINE CULTURE     Status: None   Collection Time    09/24/13  5:11 PM      Result Value Ref Range Status   Specimen Description URINE, CLEAN CATCH   Final   Special Requests NONE   Final   Culture  Setup Time     Final   Value: 09/24/2013 21:06     Performed at [facility]     Final   Value: NO GROWTH     Performed at [facility]   Culture     Final   Value: NO GROWTH     Performed at [facility]   Report Status 09/25/2013 FINAL   Final  BODY FLUID CULTURE     Status: None   Collection Time    09/30/13  3:47 PM      Result Value Ref Range Status   Specimen Description PLEURAL   Final   Special Requests NONE   Final   Gram Stain  Final   Value: MODERATE WBC PRESENT,BOTH PMN AND MONONUCLEAR     NO ORGANISMS SEEN     Performed at [facility]   Culture     Final   Value: NO GROWTH 2 DAYS     Performed at [facility]   Report Status PENDING   Incomplete  BODY FLUID CULTURE     Status: None   Collection Time    10/02/13  2:59 PM      Result Value Ref Range Status   Specimen Description PLEURAL   Final   Special Requests Normal   Final   Gram Stain     Final   Value: FEW WBC PRESENT,BOTH PMN AND MONONUCLEAR     NO ORGANISMS SEEN     Performed at [facility]   Culture PENDING   Incomplete   Report Status PENDING   Incomplete   Assessment: 3 yom with h/o lung CA on chemo, R pleural effusion s/p perc drain, HTN, former smoker, stage III CKD. Presented to ED with fever, productive cough, yellow sputum, nasal congestion/runny nose. Vanc and Zosyn ordered in ED x1 for HCAP  7/16 >>zosyn >> 7/16 >>vanco >>   Day #9 vanco/zosyn Tmax: Afebrile WBCs: elevated, 11.6 Renal: CKD - SCr 1.32 (slightly improved, near baseline) with CrCl 54 CG / ~61N (not strict I/O so unable to determine UOP)  7/16 blood: NG F 7/16 urine: NGF 7/22  Pleural fluid: NGTD, pending 7/23 Sputum: sent 7/23 C.diff PCR: sent 7/24 Plueral fluid: pending 7/24 Cdiff: sent  Drug level / dose changes info: 7/19: 0530 VT = 14.4 mcg/ml on vancomycin 500mg  q12h 7/25: 0500 VT = 12 mcg/ml on vancomycin 500mg  q12h  Goal of Therapy:  Vancomycin trough level 15-20 mcg/ml Appropriate antibiotic dosing for renal function; eradication of infection  Plan:   Continue Zosyn 3.375g IV Q8H infused over 4hrs.  Increase to Vancomycin 750 mg IV q12h.  Measure Vanc trough at steady state.  Follow up renal fxn and culture results.   Gretta Arab PharmD, BCPS Pager [phone] 10/03/2013 7:36 AM

## 2013-10-03 NOTE — Progress Notes (Signed)
CT Chest reviewed with Dr. Annamaria Boots (R) hydroPTX sl increased in size compared to prior study. Output consistently only 75-1200 mL though 190 mL last 24. Based on size of effusion on imaging, would expect more. Will inject TPA through catheter today in hopes of improved output.  Ascencion Dike PA-C Interventional Radiology 10/03/2013 9:51 AM

## 2013-10-03 NOTE — Progress Notes (Signed)
Name: Lynda Capistran MRN: [id number] DOB: 07-02-1955    ADMISSION DATE:  09/24/2013 CONSULTATION DATE:  7/24   REFERRING MD :  [name] PRIMARY SERVICE:  [facility]  CHIEF COMPLAINT:  Pleural effusion   BRIEF PATIENT DESCRIPTION:  58 year old male w/ NSCLC on palliative rx w/ Tarceva and right pleur-x. Last CT chest showed progression of disease on 7/15. Admitted w/ pneumonia +/- new infected pleural space on the right. PCCM was asked to see in regards to making recommendations about treatment options for possible complicated pleural effusion.   SIGNIFICANT EVENTS / STUDIES:  7/16 admitted w/ fever and PNA  Left thoracentesis 7/22: + malignant cells CT chest 7/24>>>  LINES / TUBES: Chronic pleur-x tube   CULTURES: Blood culture 7/22>>> Left Pleural fluid 7/22>>>  ANTIBIOTICS: Zosyn 7/16>>> vanc 7/16>>>   SUBJECTIVE:  Denies dyspnea. Minor focal pain at chest tube site    VITAL SIGNS: Temp:  [97.6 F (36.4 C)-98.4 F (36.9 C)] 98.4 F (36.9 C) (07/25 0511) Pulse Rate:  [74-85] 82 (07/25 0511) Resp:  [16-20] 16 (07/25 0511) BP: (125-138)/(62-79) 134/62 mmHg (07/25 0511) SpO2:  [93 %-97 %] 93 % (07/25 0821) Weight:  [64.003 kg (141 lb 1.6 oz)] 64.003 kg (141 lb 1.6 oz) (07/25 0511) Room air  PHYSICAL EXAMINATION: General:  Chronically ill appearing white male, in no acute distress. Very friendly/ talkative Neuro:  Awake, alert, no focal def   HEENT:  Poor dentition  Cardiovascular:  rrr Lungs:  Fine rales Left base, decreased on right. Left thoracic tube unremarkable. R Pleurx clamped  Abdomen:  Soft, nt no om  Musculoskeletal:  Intact  Skin:  intact   Recent Labs Lab 10/01/13 0430 10/02/13 1215 10/03/13 0505  NA 141 139 140  K 3.3* 3.1* 3.2*  CL 96 98 98  CO2 33* 32 34*  BUN 24* 18 13  CREATININE 1.58* 1.34 1.32  GLUCOSE 119* 110* 111*    Recent Labs Lab 10/01/13 0430 10/02/13 1215 10/03/13 0505  HGB 12.9* 12.2* 12.9*  HCT 42.3 38.9* 41.3  WBC 15.4*  11.7* 11.6*  PLT 341 329 326   Ct Chest Wo Contrast  10/02/2013   CLINICAL DATA:  Evaluate effusion.  History of lung carcinoma.  EXAM: CT CHEST WITHOUT CONTRAST  TECHNIQUE: Multidetector CT imaging of the chest was performed following the standard protocol without IV contrast.  COMPARISON:  Chest radiograph, 09/30/2013. Chest CTs, 09/23/2013 and 06/03/2013.  FINDINGS: Since the prior study, there has been increase in consolidation in the right mid and upper lung. Dense consolidation/atelectasis in the right lower lung is without change. Right-sided hydro pneumothorax has mildly increased, measuring 4.7 cm in thickness at the level of the inferior vena cava atrial confluence where it had measured 3.4 cm.  Pleural effusion on the left has decreased in size, approximately half the volume of the prior study.  Small pulmonary nodules noted on the prior study is stable. An area of focal confluent opacity in the left upper lobe has increased common measuring 2.2 cm x 1.8 cm in size.  Volume loss on the right is similar to the prior exam. Soft tissue contiguous with the lung consolidation/ atelectasis surrounds the right hilar structures, unchanged. No discrete mass is seen. No discrete enlarged mediastinal lymph nodes are seen. No left hilar masses or adenopathy.  Limited evaluation of the upper abdomen again shows ascites and evidence of omental/peritoneal metastatic disease.  No osteoblastic or osteolytic lesions.  IMPRESSION: 1. Right hydro pneumothorax has mildly increased  size from the prior study and consolidation in the right mid and upper lung has mildly increased. 2. There is a small area of focal airspace opacity in the left upper lobe that has increased in size from the prior exam. 3. Pleural effusion on the left has decreased in size. 4. Pulmonary nodules noted previously consistent with metastatic disease are unchanged from the recent prior CT of the chest, abdomen and pelvis. Peritoneal and omental  metastatic disease noted on the prior exam is also unchanged.   Electronically Signed   By: Lajean Manes M.D.   On: 10/02/2013 16:30    ASSESSMENT / PLAN:  NSCLCA w/ radiographic evidence of progression as well as new malignant left effusion Malignant bilateral pleural effusions Pneumonia (nos) Complicated left pleural effusion. Not sure how much of this is cancer and how much is possible empyema/infection  Plan R thoracentesis done 7/22 CT (non-contrast) chest. IR has placed TPA. Tube is now clamped until 3:00PM. If tpa can't improve drainage, then can consult TSGY. Cont current abx. Will defer to ID re: course  Do not think he is a good candidate for VATs   10/03/2013, 12:12 PM  Summary 58 yo male with Stage IV NSCLC complicated by b/l malignant pleural effusions.  He has persistent effusion on Rt in spite of having pleurx catheter.  Concern is that he has complicated effusion with infection.   Have d/w IR.   Instilling thrombolytics through pleurx,  If inadequate drainage, then  Thoracic surgery assessment for surgical intervention.  [place], [place] [phone]    p   [phone] After 3pm call: [phone]

## 2013-10-03 NOTE — Progress Notes (Signed)
No change from am assessment.

## 2013-10-03 NOTE — Progress Notes (Signed)
Chest tube reconnected to suction, good drainage noted.

## 2013-10-03 NOTE — Progress Notes (Signed)
[facility] PROGRESS NOTE  [name] [name] IWP:[id number] DOB: 1956/02/21 DOA: 09/24/2013 PCP: Woody Seller, MD   Brief narrative 58 year old male with history of non-small cell lung cancer on chemotherapy (followed by Dr. Julien Nordmann), recurrent right pleural effusion status post percutaneous drain,  former smoker, hypertension, chronic kidney disease presented to the ED with fever and productive cough for  2-3 days. He was seen in the oncology clinic one day prior to admission and had CT chest, abdomen and pelvis showing new versus worsening pulmonary metastatic disease, decreased right pleural effusion and decreased right-sided collapse with development of right greater than left patchy airspace disease with septal thickening suspicious for a metastatic tumor spread versus infection.  CT of the abdomen showed small volume abdominal and pelvic ascites with extensive omental thickening consistent with peritoneal/omental metastases. Patient on the day of admission had a fever of 100.63F, oxygen saturation in the 70s and confused transiently. His oncologist was contacted and was ask to come to the ED. In the ED patient had a white count of 16.1k, (was normal one day prior to admission) creatinine of 1.5,  chest x-ray suggestive of diffuse pulmonary infiltrates R>L suggestive of pneumonia. Patient was started on IV vancomycin and Zosyn and admitted to medical floor.  Assessment/Plan: Acute Hypoxic respiratory failure: Right side Hydropneumothorax loculated, malignant left side pleural effusion.  -Likely combination of Healthcare associated pneumonia, progressive lung cancer and recuren pleural effusions -cultures so far negative. Requiring 2 L via [place]. Repeated CXR shows pulmonary edema with bilateral effusions with right lower lobe loculated hydropneumothorax. -Continue with  IV vancomycin and Zosyn, day 9.  -S/P left side thoracentesis, this has improved patient oxygen requirement.   -Left side  pleural fluids no growth to date.  -Appreciate Dr Johnnye Sima help.  -Pulmonary consulted for further evaluation, care of right side hydropneumothorax , loculated,. -WBC trending down.  -Repeated CT chest 7-24 showed reacumulation of right side pleural effusion.  -pleural catheter was hooked to continuous suctioning. TPA was injected to  Pleura drainage catheter.   Hypertension Stable. Continue home medications  NSVT on 7/20-7/21 Resolved.  Replete electrolytes.   Hypokalemia and hypomagnesemia 40 meq times 2.   Chronic pain Resumed home oxycodone. I would avoid ibuprofen  given his underlying renal disease. Dced neurontin as per wife's request as it was making patient "loopy".  Transient encephalopathy Possibly related to hypoxia and underlying infection and has now resolved.  Metastatic non-small cell lung cancer Followed by Dr. Julien Nordmann. Currently on Tarceva daily for palliative chemotherapy. D/w Dr Julien Nordmann who recommended to d/c tarceva as he plans on chemo as outpt  Chronic kidney disease Renal function around baseline.  discontinued NSAIDs Follow renal function.  Improving.   protein calorie malnutrition, severe  seen by nutrition and added supplements    Diet: Regular with supplements  DVT prophylaxis: Subcutaneous Lovenox   Code Status: Full code Family Communication: care discussed with wife.  Disposition Plan: PT consult. Home once improved.    Consultants:  IR  Procedures:  Change chest tube valve connector at bedside on 7/17  Antibiotics:  IV vancomycin and Zosyn 7/16>>  HPI/Subjective: No complaints, breathing better.   Objective: Filed Vitals:   10/03/13 1435  BP: 132/64  Pulse: 79  Temp: 98.4 F (36.9 C)  Resp: 18    Intake/Output Summary (Last 24 hours) at 10/03/13 1438 Last data filed at 10/03/13 1436  Gross per 24 hour  Intake   1720 ml  Output    840 ml  Net  880 ml   Filed Weights   10/01/13 0654 10/02/13 0500 10/03/13  0511  Weight: 61.9 kg (136 lb 7.4 oz) 63.5 kg (139 lb 15.9 oz) 64.003 kg (141 lb 1.6 oz)    Exam:   General:  Middle aged cachectic male in no acute distress  Cardiovascular: Normal S1 and S2, no murmurs   Respiratory: bilateral breath sound, no wheezing, , has right percutaneous drain  Abdomen: Soft, nontender, nondistended, bowel sounds normal  Musculoskeletal: no edema   Data Reviewed: Basic Metabolic Panel:  Recent Labs Lab 09/27/13 0814 09/29/13 0830 09/30/13 0430 10/01/13 0430 10/02/13 1215 10/03/13 0505  NA 140  --  144 141 139 140  K 3.9 3.0* 3.3* 3.3* 3.1* 3.2*  CL 97  --  96 96 98 98  CO2 34*  --  37* 33* 32 34*  GLUCOSE 139*  --  132* 119* 110* 111*  BUN 32*  --  26* 24* 18 13  CREATININE 1.68*  --  1.68* 1.58* 1.34 1.32  CALCIUM 8.9  --  9.4 9.0 8.4 8.6  MG  --  1.8  --  2.0  --   --    Liver Function Tests:  Recent Labs Lab 10/02/13 1215  PROT 5.2*   No results found for this basename: LIPASE, AMYLASE,  in the last 168 hours No results found for this basename: AMMONIA,  in the last 168 hours CBC:  Recent Labs Lab 09/27/13 0814 09/30/13 0430 10/01/13 0430 10/02/13 1215 10/03/13 0505  WBC 14.9* 13.2* 15.4* 11.7* 11.6*  HGB 12.6* 13.4 12.9* 12.2* 12.9*  HCT 40.6 42.1 42.3 38.9* 41.3  MCV 87.7 85.6 87.4 85.3 86.8  PLT 312 323 341 329 326   Cardiac Enzymes: No results found for this basename: CKTOTAL, CKMB, CKMBINDEX, TROPONINI,  in the last 168 hours BNP (last 3 results)  Recent Labs  09/24/13 1637  PROBNP 221.7*   CBG: No results found for this basename: GLUCAP,  in the last 168 hours  Recent Results (from the past 240 hour(s))  CULTURE, BLOOD (ROUTINE X 2)     Status: None   Collection Time    09/24/13  5:06 PM      Result Value Ref Range Status   Specimen Description BLOOD LEFT FOREARM   Final   Special Requests BOTTLES DRAWN AEROBIC AND ANAEROBIC 5 ML   Final   Culture  Setup Time     Final   Value: 09/24/2013 22:28      Performed at [facility]   Culture     Final   Value:        BLOOD CULTURE RECEIVED NO GROWTH TO DATE CULTURE WILL BE HELD FOR 5 DAYS BEFORE ISSUING A FINAL NEGATIVE REPORT     Performed at [facility]   Report Status 09/30/2013 FINAL   Final  CULTURE, BLOOD (ROUTINE X 2)     Status: None   Collection Time    09/24/13  5:06 PM      Result Value Ref Range Status   Specimen Description BLOOD RIGHT ANTECUBITAL   Final   Special Requests BOTTLES DRAWN AEROBIC AND ANAEROBIC 3 ML   Final   Culture  Setup Time     Final   Value: 09/24/2013 22:29     Performed at [facility]   Culture     Final   Value: NO GROWTH 5 DAYS     Note: FOR RESULT QUESTIONS PLEASE CALL MICRO AT [phone]  [place]     Performed at [facility]   Report Status 09/30/2013 FINAL   Final  URINE CULTURE     Status: None   Collection Time    09/24/13  5:11 PM      Result Value Ref Range Status   Specimen Description URINE, CLEAN CATCH   Final   Special Requests NONE   Final   Culture  Setup Time     Final   Value: 09/24/2013 21:06     Performed at [facility]     Final   Value: NO GROWTH     Performed at [facility]   Culture     Final   Value: NO GROWTH     Performed at [facility]   Report Status 09/25/2013 FINAL   Final  BODY FLUID CULTURE     Status: None   Collection Time    09/30/13  3:47 PM      Result Value Ref Range Status   Specimen Description PLEURAL   Final   Special Requests NONE   Final   Gram Stain     Final   Value: MODERATE WBC PRESENT,BOTH PMN AND MONONUCLEAR     NO ORGANISMS SEEN     Performed at [facility]   Culture     Final   Value: NO GROWTH 3 DAYS     Performed at [facility]   Report Status 10/03/2013 FINAL   Final  CLOSTRIDIUM DIFFICILE BY PCR     Status: None   Collection Time    10/02/13  2:12 PM      Result Value Ref Range Status   C difficile by pcr NEGATIVE  NEGATIVE Final    Comment: Performed at [facility]  BODY FLUID CULTURE     Status: None   Collection Time    10/02/13  2:59 PM      Result Value Ref Range Status   Specimen Description PLEURAL   Final   Special Requests Normal   Final   Gram Stain     Final   Value: FEW WBC PRESENT,BOTH PMN AND MONONUCLEAR     NO ORGANISMS SEEN     Performed at [facility]   Culture     Final   Value: NO GROWTH 1 DAY     Performed at [facility]   Report Status PENDING   Incomplete     Studies: Ct Chest Wo Contrast  10/02/2013   CLINICAL DATA:  Evaluate effusion.  History of lung carcinoma.  EXAM: CT CHEST WITHOUT CONTRAST  TECHNIQUE: Multidetector CT imaging of the chest was performed following the standard protocol without IV contrast.  COMPARISON:  Chest radiograph, 09/30/2013. Chest CTs, 09/23/2013 and 06/03/2013.  FINDINGS: Since the prior study, there has been increase in consolidation in the right mid and upper lung. Dense consolidation/atelectasis in the right lower lung is without change. Right-sided hydro pneumothorax has mildly increased, measuring 4.7 cm in thickness at the level of the inferior vena cava atrial confluence where it had measured 3.4 cm.  Pleural effusion on the left has decreased in size, approximately half the volume of the prior study.  Small pulmonary nodules noted on the prior study is stable. An area of focal confluent opacity in the left upper lobe has increased common measuring 2.2 cm x 1.8 cm in size.  Volume loss on the right is similar to the prior  exam. Soft tissue contiguous with the lung consolidation/ atelectasis surrounds the right hilar structures, unchanged. No discrete mass is seen. No discrete enlarged mediastinal lymph nodes are seen. No left hilar masses or adenopathy.  Limited evaluation of the upper abdomen again shows ascites and evidence of omental/peritoneal metastatic disease.  No osteoblastic or osteolytic lesions.  IMPRESSION: 1. Right hydro  pneumothorax has mildly increased size from the prior study and consolidation in the right mid and upper lung has mildly increased. 2. There is a small area of focal airspace opacity in the left upper lobe that has increased in size from the prior exam. 3. Pleural effusion on the left has decreased in size. 4. Pulmonary nodules noted previously consistent with metastatic disease are unchanged from the recent prior CT of the chest, abdomen and pelvis. Peritoneal and omental metastatic disease noted on the prior exam is also unchanged.   Electronically Signed   By: Lajean Manes M.D.   On: 10/02/2013 16:30    Scheduled Meds: . amLODipine  10 mg Oral QHS  . antiseptic oral rinse  15 mL Mouth Rinse BID  . enoxaparin (LOVENOX) injection  40 mg Subcutaneous Q24H  . fluticasone  1 spray Each Nare Daily  . folic acid  0.5 mg Oral Daily  . guaiFENesin  1,200 mg Oral BID  . ipratropium-albuterol  3 mL Nebulization TID  . metoprolol succinate  50 mg Oral QHS  . multivitamin with minerals  1 tablet Oral Daily  . OxyCODONE  40 mg Oral TID  . piperacillin-tazobactam (ZOSYN)  IV  3.375 g Intravenous 3 times per day  . sertraline  100 mg Oral QHS  . sodium chloride  3 mL Intravenous Q12H  . vancomycin  750 mg Intravenous Q12H   Continuous Infusions:    Time spent: 25 minutes    Passion Lavin A  [facility] Pager [phone] If 7PM-7AM, please contact night-coverage at [email or web address], password [facility] 10/03/2013, 2:38 PM  LOS: 9 days

## 2013-10-03 NOTE — Progress Notes (Signed)
5mg  in 30mL NS injected through pleural drain without difficulty. Will keep tube clamped and have pt shift/alternate positions for the next 4 hrs, then resume suction. Follow output, can repeat TPA tomorrow if necessary.  Ascencion Dike PA-C Interventional Radiology 10/03/2013 11:33 AM

## 2013-10-04 DIAGNOSIS — C3400 Malignant neoplasm of unspecified main bronchus: Secondary | ICD-10-CM

## 2013-10-04 LAB — CBC
HCT: 40.4 % (ref 39.0–52.0)
HEMOGLOBIN: 12.9 g/dL — AB (ref 13.0–17.0)
MCH: 27 pg (ref 26.0–34.0)
MCHC: 31.9 g/dL (ref 30.0–36.0)
MCV: 84.5 fL (ref 78.0–100.0)
Platelets: 359 10*3/uL (ref 150–400)
RBC: 4.78 MIL/uL (ref 4.22–5.81)
RDW: 13.7 % (ref 11.5–15.5)
WBC: 13.4 10*3/uL — AB (ref 4.0–10.5)

## 2013-10-04 LAB — BASIC METABOLIC PANEL
Anion gap: 10 (ref 5–15)
BUN: 12 mg/dL (ref 6–23)
CO2: 31 mEq/L (ref 19–32)
Calcium: 8.4 mg/dL (ref 8.4–10.5)
Chloride: 97 mEq/L (ref 96–112)
Creatinine, Ser: 1.3 mg/dL (ref 0.50–1.35)
GFR calc Af Amer: 68 mL/min — ABNORMAL LOW (ref >90)
GFR, EST NON AFRICAN AMERICAN: 59 mL/min — AB (ref >90)
Glucose, Bld: 109 mg/dL — ABNORMAL HIGH (ref 70–99)
Potassium: 3.5 mEq/L — ABNORMAL LOW (ref 3.7–5.3)
SODIUM: 138 meq/L (ref 137–147)

## 2013-10-04 NOTE — Progress Notes (Signed)
Name: [name] [name] MRN: [id number] DOB: 03/07/1956    ADMISSION DATE:  09/24/2013 CONSULTATION DATE:  7/24   REFERRING MD :  [name] PRIMARY SERVICE:  [facility]  CHIEF COMPLAINT:  Pleural effusion   BRIEF PATIENT DESCRIPTION:  58 year old male w/ NSCLC on palliative rx w/ Tarceva and right pleur-x. Last CT chest showed progression of disease on 7/15. Admitted w/ pneumonia +/- new infected pleural space on the right. PCCM was asked to see in regards to making recommendations about treatment options for possible complicated pleural effusion.   SIGNIFICANT EVENTS / STUDIES:  7/16 admitted w/ fever and PNA  Left thoracentesis 7/22: + malignant cells 7/25-TPA through R chest tube by IR   CT chest 7/24>>>  LINES / TUBES: Chronic pleur-x tube   CULTURES: Blood culture 7/22>>> Left Pleural fluid 7/22>>>  ANTIBIOTICS: Zosyn 7/16>>> vanc 7/16>>>   SUBJECTIVE:  Feels better with draining R chest tube    VITAL SIGNS: Temp:  [98.1 F (36.7 C)-98.4 F (36.9 C)] 98.3 F (36.8 C) (07/26 0500) Pulse Rate:  [79-89] 89 (07/26 0500) Resp:  [18] 18 (07/26 0500) BP: (127-151)/(60-72) 151/72 mmHg (07/26 0500) SpO2:  [92 %-96 %] 94 % (07/26 0819) Weight:  [64.411 kg (142 lb)] 64.411 kg (142 lb) (07/26 0522) Room air  PHYSICAL EXAMINATION: General:  Chronically ill appearing white male, in no acute distress. Very friendly/ talkative Neuro:  Awake, alert, no focal def   HEENT:  Poor dentition  Cardiovascular:  rrr Lungs:  Fine rales Left base, decreased on right. R chest tube draining serosanguinous. Hard cough Abdomen:  Soft, nt no om  Musculoskeletal:  Intact  Skin:  intact   Recent Labs Lab 10/02/13 1215 10/03/13 0505 10/04/13 0500  NA 139 140 138  K 3.1* 3.2* 3.5*  CL 98 98 97  CO2 32 34* 31  BUN 18 13 12   CREATININE 1.34 1.32 1.30  GLUCOSE 110* 111* 109*    Recent Labs Lab 10/02/13 1215 10/03/13 0505 10/04/13 0500  HGB 12.2* 12.9* 12.9*  HCT 38.9* 41.3 40.4  WBC  11.7* 11.6* 13.4*  PLT 329 326 359   Ct Chest Wo Contrast  10/02/2013   CLINICAL DATA:  Evaluate effusion.  History of lung carcinoma.  EXAM: CT CHEST WITHOUT CONTRAST  TECHNIQUE: Multidetector CT imaging of the chest was performed following the standard protocol without IV contrast.  COMPARISON:  Chest radiograph, 09/30/2013. Chest CTs, 09/23/2013 and 06/03/2013.  FINDINGS: Since the prior study, there has been increase in consolidation in the right mid and upper lung. Dense consolidation/atelectasis in the right lower lung is without change. Right-sided hydro pneumothorax has mildly increased, measuring 4.7 cm in thickness at the level of the inferior vena cava atrial confluence where it had measured 3.4 cm.  Pleural effusion on the left has decreased in size, approximately half the volume of the prior study.  Small pulmonary nodules noted on the prior study is stable. An area of focal confluent opacity in the left upper lobe has increased common measuring 2.2 cm x 1.8 cm in size.  Volume loss on the right is similar to the prior exam. Soft tissue contiguous with the lung consolidation/ atelectasis surrounds the right hilar structures, unchanged. No discrete mass is seen. No discrete enlarged mediastinal lymph nodes are seen. No left hilar masses or adenopathy.  Limited evaluation of the upper abdomen again shows ascites and evidence of omental/peritoneal metastatic disease.  No osteoblastic or osteolytic lesions.  IMPRESSION: 1. Right hydro pneumothorax has  mildly increased size from the prior study and consolidation in the right mid and upper lung has mildly increased. 2. There is a small area of focal airspace opacity in the left upper lobe that has increased in size from the prior exam. 3. Pleural effusion on the left has decreased in size. 4. Pulmonary nodules noted previously consistent with metastatic disease are unchanged from the recent prior CT of the chest, abdomen and pelvis. Peritoneal and omental  metastatic disease noted on the prior exam is also unchanged.   Electronically Signed   By: Lajean Manes M.D.   On: 10/02/2013 16:30    ASSESSMENT / PLAN:  NSCLCA w/ radiographic evidence of progression as well as new malignant left effusion Malignant bilateral pleural effusions Pneumonia (nos) Complicated left pleural effusion. Not sure how much of this is cancer and how much is possible empyema/infection R loculated effusion draining after TPA through R chest tube by IR on 7/25  Plan Continue drainage R chest tube Cont current abx. Will defer to ID re: course  Do not think he is a good candidate for VATs CXR for 7/27   10/04/2013, 12:08 PM  Summary 58 yo male with Stage IV NSCLC complicated by b/l malignant pleural effusions.  He has persistent effusion on Rt in spite of having pleurx catheter.  Concern is that he has complicated effusion with infection. IR instilled thrombolytics through pleurx,  If inadequate drainage, then Thoracic surgery assessment for surgical intervention.  [place], [place] [phone]    p   [phone] After 3pm call: [phone]

## 2013-10-04 NOTE — Progress Notes (Signed)
Subjective: Pt feeling better. TPA injected through right pleural tube yesterday with good response. Pt feels breathing is easier, he is really encouraged.  Objective: Physical Exam: BP 151/72  Pulse 89  Temp(Src) 98.3 F (36.8 C) (Oral)  Resp 18  Ht 5\' 10"  (1.778 m)  Wt 142 lb (64.411 kg)  BMI 20.37 kg/m2  SpO2 94% (R)pleural drain intact, site clean. Output ~952mL of blood tinged fluid since TPA.  There is still fluid coming though drain this am while on suction.    Labs: CBC  Recent Labs  10/03/13 0505 10/04/13 0500  WBC 11.6* 13.4*  HGB 12.9* 12.9*  HCT 41.3 40.4  PLT 326 359   BMET  Recent Labs  10/03/13 0505 10/04/13 0500  NA 140 138  K 3.2* 3.5*  CL 98 97  CO2 34* 31  GLUCOSE 111* 109*  BUN 13 12  CREATININE 1.32 1.30  CALCIUM 8.6 8.4   LFT  Recent Labs  10/02/13 1215  PROT 5.2*   PT/INR No results found for this basename: LABPROT, INR,  in the last 72 hours   Studies/Results: Ct Chest Wo Contrast  10/02/2013   CLINICAL DATA:  Evaluate effusion.  History of lung carcinoma.  EXAM: CT CHEST WITHOUT CONTRAST  TECHNIQUE: Multidetector CT imaging of the chest was performed following the standard protocol without IV contrast.  COMPARISON:  Chest radiograph, 09/30/2013. Chest CTs, 09/23/2013 and 06/03/2013.  FINDINGS: Since the prior study, there has been increase in consolidation in the right mid and upper lung. Dense consolidation/atelectasis in the right lower lung is without change. Right-sided hydro pneumothorax has mildly increased, measuring 4.7 cm in thickness at the level of the inferior vena cava atrial confluence where it had measured 3.4 cm.  Pleural effusion on the left has decreased in size, approximately half the volume of the prior study.  Small pulmonary nodules noted on the prior study is stable. An area of focal confluent opacity in the left upper lobe has increased common measuring 2.2 cm x 1.8 cm in size.  Volume loss on the right is  similar to the prior exam. Soft tissue contiguous with the lung consolidation/ atelectasis surrounds the right hilar structures, unchanged. No discrete mass is seen. No discrete enlarged mediastinal lymph nodes are seen. No left hilar masses or adenopathy.  Limited evaluation of the upper abdomen again shows ascites and evidence of omental/peritoneal metastatic disease.  No osteoblastic or osteolytic lesions.  IMPRESSION: 1. Right hydro pneumothorax has mildly increased size from the prior study and consolidation in the right mid and upper lung has mildly increased. 2. There is a small area of focal airspace opacity in the left upper lobe that has increased in size from the prior exam. 3. Pleural effusion on the left has decreased in size. 4. Pulmonary nodules noted previously consistent with metastatic disease are unchanged from the recent prior CT of the chest, abdomen and pelvis. Peritoneal and omental metastatic disease noted on the prior exam is also unchanged.   Electronically Signed   By: Lajean Manes M.D.   On: 10/02/2013 16:30    Assessment/Plan: (R)hydroPTX  Marked increase in output from pleural drain after TPA >900 mL Will continue suction today, no need for repeat TPA yet. Will check CXR tomorrow. If effusion resolved and output minimal, will clamp tube. If CXR still shows residual effusion, can consider repeat TPA.    LOS: 10 days    Ascencion Dike PA-C 10/04/2013 10:05 AM

## 2013-10-04 NOTE — Progress Notes (Signed)
[facility] PROGRESS NOTE  Anvay Tennis IOX:[id number] DOB: 1956/02/03 DOA: 09/24/2013 PCP: Woody Seller, MD   Brief narrative 58 year old male with history of non-small cell lung cancer on chemotherapy (followed by Dr. Julien Nordmann), recurrent right pleural effusion status post percutaneous drain,  former smoker, hypertension, chronic kidney disease presented to the ED with fever and productive cough for  2-3 days. He was seen in the oncology clinic one day prior to admission and had CT chest, abdomen and pelvis showing new versus worsening pulmonary metastatic disease, decreased right pleural effusion and decreased right-sided collapse with development of right greater than left patchy airspace disease with septal thickening suspicious for a metastatic tumor spread versus infection.  CT of the abdomen showed small volume abdominal and pelvic ascites with extensive omental thickening consistent with peritoneal/omental metastases. Patient on the day of admission had a fever of 100.70F, oxygen saturation in the 70s and confused transiently. His oncologist was contacted and was ask to come to the ED. In the ED patient had a white count of 16.1k, (was normal one day prior to admission) creatinine of 1.5,  chest x-ray suggestive of diffuse pulmonary infiltrates R>L suggestive of pneumonia. Patient was started on IV vancomycin and Zosyn and admitted to medical floor.  Assessment/Plan: Acute Hypoxic respiratory failure: Right side Hydropneumothorax loculated, malignant left side pleural effusion.  -Likely combination of Healthcare associated pneumonia, progressive lung cancer and recuren pleural effusions -cultures so far negative. Requiring 2 L via [place]. Repeated CXR shows pulmonary edema with bilateral effusions with right lower lobe loculated hydropneumothorax. -Continue with  IV vancomycin and Zosyn, day 10.  -S/P left side thoracentesis, this has improved patient oxygen requirement.   -Left side  pleural fluids no growth to date.  -Appreciate Dr Johnnye Sima help.  -Pulmonary consulted for further evaluation, care of right side hydropneumothorax , loculated,. -WBC at 13.  -Repeated CT chest 7-24 showed reacumulation of right side pleural effusion.  -pleural catheter was hooked to continuous suctioning. TPA was injected through   Pleura drainage catheter.  -pleura catheter draining after TPA, so far 900.  -plan to repeat chest x ray 7-27.   Hypertension Stable. Continue home medications  NSVT on 7/20-7/21 Resolved.  Replete electrolytes.   Hypokalemia and hypomagnesemia 40 meq times 2.   Chronic pain Resumed home oxycodone. I would avoid ibuprofen  given his underlying renal disease. Dced neurontin as per wife's request as it was making patient "loopy".  Transient encephalopathy Possibly related to hypoxia and underlying infection and has now resolved.  Metastatic non-small cell lung cancer Followed by Dr. Julien Nordmann.  D/w Dr Julien Nordmann who recommended to d/c tarceva as he plans on chemo as outpt  Chronic kidney disease Renal function around baseline.  discontinued NSAIDs Follow renal function.  Improving.   protein calorie malnutrition, severe  seen by nutrition and added supplements    Diet: Regular with supplements  DVT prophylaxis: Subcutaneous Lovenox   Code Status: Full code Family Communication: care discussed with wife.  Disposition Plan: PT consult. Home once improved.    Consultants:  IR  Procedures:  Change chest tube valve connector at bedside on 7/17  Antibiotics:  IV vancomycin and Zosyn 7/16>>  HPI/Subjective: No complaints, feeling much better after pleura catheter is draining.   Objective: Filed Vitals:   10/04/13 0500  BP: 151/72  Pulse: 89  Temp: 98.3 F (36.8 C)  Resp: 18    Intake/Output Summary (Last 24 hours) at 10/04/13 1210 Last data filed at 10/04/13 0946  Gross per  24 hour  Intake    850 ml  Output    780 ml  Net      70 ml   Filed Weights   10/02/13 0500 10/03/13 0511 10/04/13 0522  Weight: 63.5 kg (139 lb 15.9 oz) 64.003 kg (141 lb 1.6 oz) 64.411 kg (142 lb)    Exam:   General:  Middle aged cachectic male in no acute distress  Cardiovascular: Normal S1 and S2, no murmurs   Respiratory: bilateral breath sound, no wheezing, , has right percutaneous drain in place.   Abdomen: Soft, nontender, nondistended, bowel sounds normal  Musculoskeletal: no edema   Data Reviewed: Basic Metabolic Panel:  Recent Labs Lab 09/29/13 0830 09/30/13 0430 10/01/13 0430 10/02/13 1215 10/03/13 0505 10/04/13 0500  NA  --  144 141 139 140 138  K 3.0* 3.3* 3.3* 3.1* 3.2* 3.5*  CL  --  96 96 98 98 97  CO2  --  37* 33* 32 34* 31  GLUCOSE  --  132* 119* 110* 111* 109*  BUN  --  26* 24* 18 13 12   CREATININE  --  1.68* 1.58* 1.34 1.32 1.30  CALCIUM  --  9.4 9.0 8.4 8.6 8.4  MG 1.8  --  2.0  --   --   --    Liver Function Tests:  Recent Labs Lab 10/02/13 1215  PROT 5.2*   No results found for this basename: LIPASE, AMYLASE,  in the last 168 hours No results found for this basename: AMMONIA,  in the last 168 hours CBC:  Recent Labs Lab 09/30/13 0430 10/01/13 0430 10/02/13 1215 10/03/13 0505 10/04/13 0500  WBC 13.2* 15.4* 11.7* 11.6* 13.4*  HGB 13.4 12.9* 12.2* 12.9* 12.9*  HCT 42.1 42.3 38.9* 41.3 40.4  MCV 85.6 87.4 85.3 86.8 84.5  PLT 323 341 329 326 359   Cardiac Enzymes: No results found for this basename: CKTOTAL, CKMB, CKMBINDEX, TROPONINI,  in the last 168 hours BNP (last 3 results)  Recent Labs  09/24/13 1637  PROBNP 221.7*   CBG: No results found for this basename: GLUCAP,  in the last 168 hours  Recent Results (from the past 240 hour(s))  CULTURE, BLOOD (ROUTINE X 2)     Status: None   Collection Time    09/24/13  5:06 PM      Result Value Ref Range Status   Specimen Description BLOOD LEFT FOREARM   Final   Special Requests BOTTLES DRAWN AEROBIC AND ANAEROBIC 5 ML    Final   Culture  Setup Time     Final   Value: 09/24/2013 22:28     Performed at [facility]   Culture     Final   Value:        BLOOD CULTURE RECEIVED NO GROWTH TO DATE CULTURE WILL BE HELD FOR 5 DAYS BEFORE ISSUING A FINAL NEGATIVE REPORT     Performed at [facility]   Report Status 09/30/2013 FINAL   Final  CULTURE, BLOOD (ROUTINE X 2)     Status: None   Collection Time    09/24/13  5:06 PM      Result Value Ref Range Status   Specimen Description BLOOD RIGHT ANTECUBITAL   Final   Special Requests BOTTLES DRAWN AEROBIC AND ANAEROBIC 3 ML   Final   Culture  Setup Time     Final   Value: 09/24/2013 22:29     Performed at [facility]  Final   Value: NO GROWTH 5 DAYS     Note: FOR RESULT QUESTIONS PLEASE CALL MICRO AT [phone]     Performed at [facility]   Report Status 09/30/2013 FINAL   Final  URINE CULTURE     Status: None   Collection Time    09/24/13  5:11 PM      Result Value Ref Range Status   Specimen Description URINE, CLEAN CATCH   Final   Special Requests NONE   Final   Culture  Setup Time     Final   Value: 09/24/2013 21:06     Performed at [facility]     Final   Value: NO GROWTH     Performed at [facility]   Culture     Final   Value: NO GROWTH     Performed at [facility]   Report Status 09/25/2013 FINAL   Final  BODY FLUID CULTURE     Status: None   Collection Time    09/30/13  3:47 PM      Result Value Ref Range Status   Specimen Description PLEURAL   Final   Special Requests NONE   Final   Gram Stain     Final   Value: MODERATE WBC PRESENT,BOTH PMN AND MONONUCLEAR     NO ORGANISMS SEEN     Performed at [facility]   Culture     Final   Value: NO GROWTH 3 DAYS     Performed at [facility]   Report Status 10/03/2013 FINAL   Final  CLOSTRIDIUM DIFFICILE BY PCR     Status: None   Collection Time    10/02/13  2:12 PM      Result Value  Ref Range Status   C difficile by pcr NEGATIVE  NEGATIVE Final   Comment: Performed at [facility]  BODY FLUID CULTURE     Status: None   Collection Time    10/02/13  2:59 PM      Result Value Ref Range Status   Specimen Description PLEURAL   Final   Special Requests Normal   Final   Gram Stain     Final   Value: FEW WBC PRESENT,BOTH PMN AND MONONUCLEAR     NO ORGANISMS SEEN     Performed at [facility]   Culture     Final   Value: NO GROWTH 1 DAY     Performed at [facility]   Report Status PENDING   Incomplete     Studies: Ct Chest Wo Contrast  10/02/2013   CLINICAL DATA:  Evaluate effusion.  History of lung carcinoma.  EXAM: CT CHEST WITHOUT CONTRAST  TECHNIQUE: Multidetector CT imaging of the chest was performed following the standard protocol without IV contrast.  COMPARISON:  Chest radiograph, 09/30/2013. Chest CTs, 09/23/2013 and 06/03/2013.  FINDINGS: Since the prior study, there has been increase in consolidation in the right mid and upper lung. Dense consolidation/atelectasis in the right lower lung is without change. Right-sided hydro pneumothorax has mildly increased, measuring 4.7 cm in thickness at the level of the inferior vena cava atrial confluence where it had measured 3.4 cm.  Pleural effusion on the left has decreased in size, approximately half the volume of the prior study.  Small pulmonary nodules noted on the prior study is stable. An area of focal confluent opacity in the left upper lobe has  increased common measuring 2.2 cm x 1.8 cm in size.  Volume loss on the right is similar to the prior exam. Soft tissue contiguous with the lung consolidation/ atelectasis surrounds the right hilar structures, unchanged. No discrete mass is seen. No discrete enlarged mediastinal lymph nodes are seen. No left hilar masses or adenopathy.  Limited evaluation of the upper abdomen again shows ascites and evidence of omental/peritoneal metastatic disease.  No  osteoblastic or osteolytic lesions.  IMPRESSION: 1. Right hydro pneumothorax has mildly increased size from the prior study and consolidation in the right mid and upper lung has mildly increased. 2. There is a small area of focal airspace opacity in the left upper lobe that has increased in size from the prior exam. 3. Pleural effusion on the left has decreased in size. 4. Pulmonary nodules noted previously consistent with metastatic disease are unchanged from the recent prior CT of the chest, abdomen and pelvis. Peritoneal and omental metastatic disease noted on the prior exam is also unchanged.   Electronically Signed   By: Lajean Manes M.D.   On: 10/02/2013 16:30    Scheduled Meds: . amLODipine  10 mg Oral QHS  . antiseptic oral rinse  15 mL Mouth Rinse BID  . enoxaparin (LOVENOX) injection  40 mg Subcutaneous Q24H  . fluticasone  1 spray Each Nare Daily  . folic acid  0.5 mg Oral Daily  . guaiFENesin  1,200 mg Oral BID  . ipratropium-albuterol  3 mL Nebulization TID  . metoprolol succinate  50 mg Oral QHS  . multivitamin with minerals  1 tablet Oral Daily  . OxyCODONE  40 mg Oral TID  . piperacillin-tazobactam (ZOSYN)  IV  3.375 g Intravenous 3 times per day  . potassium chloride  40 mEq Oral BID  . sertraline  100 mg Oral QHS  . sodium chloride  3 mL Intravenous Q12H  . vancomycin  750 mg Intravenous Q12H   Continuous Infusions:    Time spent: 25 minutes    Regalado, Belkys A  [facility] Pager [phone] If 7PM-7AM, please contact night-coverage at [email or web address], password [facility] 10/04/2013, 12:10 PM  LOS: 10 days

## 2013-10-05 ENCOUNTER — Inpatient Hospital Stay (HOSPITAL_COMMUNITY): Payer: Medicare Other

## 2013-10-05 DIAGNOSIS — J90 Pleural effusion, not elsewhere classified: Secondary | ICD-10-CM

## 2013-10-05 LAB — BODY FLUID CULTURE
CULTURE: NO GROWTH
SPECIAL REQUESTS: NORMAL

## 2013-10-05 LAB — QUANTIFERON TB GOLD ASSAY (BLOOD)
Mitogen value: 0.16 IU/mL
Quantiferon Nil Value: 0.02 IU/mL
TB AG VALUE: 0.02 [IU]/mL
TB Antigen Minus Nil Value: 0 IU/mL

## 2013-10-05 LAB — PATHOLOGIST SMEAR REVIEW

## 2013-10-05 MED ORDER — MAGIC MOUTHWASH W/LIDOCAINE
5.0000 mL | Freq: Four times a day (QID) | ORAL | Status: DC | PRN
  Filled 2013-10-05: qty 5

## 2013-10-05 NOTE — Progress Notes (Signed)
[facility] PROGRESS NOTE  [name] [name] DJM:[id number] DOB: 1955-06-06 DOA: 09/24/2013 PCP: Woody Seller, MD   Brief narrative 58 year old male with history of non-small cell lung cancer on chemotherapy (followed by Dr. Julien Nordmann), recurrent right pleural effusion status post percutaneous drain,  former smoker, hypertension, chronic kidney disease presented to the ED with fever and productive cough for  2-3 days. He was seen in the oncology clinic one day prior to admission and had CT chest, abdomen and pelvis showing new versus worsening pulmonary metastatic disease, decreased right pleural effusion and decreased right-sided collapse with development of right greater than left patchy airspace disease with septal thickening suspicious for a metastatic tumor spread versus infection.  CT of the abdomen showed small volume abdominal and pelvic ascites with extensive omental thickening consistent with peritoneal/omental metastases. Patient on the day of admission had a fever of 100.84F, oxygen saturation in the 70s and confused transiently. His oncologist was contacted and was ask to come to the ED. In the ED patient had a white count of 16.1k, (was normal one day prior to admission) creatinine of 1.5,  chest x-ray suggestive of diffuse pulmonary infiltrates R>L suggestive of pneumonia. Patient was started on IV vancomycin and Zosyn and admitted to medical floor.  Assessment/Plan: Acute Hypoxic respiratory failure: Right side Hydropneumothorax loculated, malignant left side pleural effusion.  -Likely combination of Healthcare associated pneumonia, progressive lung cancer and recuren pleural effusions -cultures so far negative. Requiring 2 L via [place]. Repeated CXR shows pulmonary edema with bilateral effusions with right lower lobe loculated hydropneumothorax. -Continue with  IV vancomycin and Zosyn, day 10.  -S/P left side thoracentesis, this has improved patient oxygen requirement.   -Left side  pleural fluids no growth to date.  -Appreciate Dr Johnnye Sima help.  -Pulmonary consulted for further evaluation, care of right side hydropneumothorax , loculated,. -WBC at 13.  -Repeated CT chest 7-24 showed reacumulation of right side pleural effusion.  -pleural catheter was hooked to continuous suctioning. TPA was injected through   Pleura drainage catheter.  -pleura catheter draining after TPA. Pleura catheter clamp.  -Chest x ray with improve right hydropneumothorax.  -Appreciate Dr Johnnye Sima, plan to discharge patient on Augmentin.   Hypertension Stable. Continue home medications  NSVT on 7/20-7/21 Resolved.  Replete electrolytes.   Hypokalemia and hypomagnesemia Repeat B-met in am.   Chronic pain Resumed home oxycodone. I would avoid ibuprofen  given his underlying renal disease. Dced neurontin as per wife's request as it was making patient "loopy".  Transient encephalopathy Possibly related to hypoxia and underlying infection and has now resolved.  Metastatic non-small cell lung cancer Followed by Dr. Julien Nordmann.  D/w Dr Julien Nordmann who recommended to d/c tarceva as he plans on chemo as outpt  Chronic kidney disease Renal function around baseline.  discontinued NSAIDs Follow renal function.  Improving.   protein calorie malnutrition, severe  seen by nutrition and added supplements    Diet: Regular with supplements  DVT prophylaxis: Subcutaneous Lovenox   Code Status: Full code Family Communication: care discussed with wife.  Disposition Plan: discharge tomorrow ?    Consultants:  IR  Procedures:  Change chest tube valve connector at bedside on 7/17  Antibiotics:  IV vancomycin and Zosyn 7/16>>  HPI/Subjective: No worsening dyspnea, relates tongue pain and scaling.  He was eating hamburger.   Objective: Filed Vitals:   10/05/13 1433  BP: 120/74  Pulse: 84  Temp: 97.4 F (36.3 C)  Resp: 20    Intake/Output Summary (Last 24 hours) at  10/05/13  1744 Last data filed at 10/05/13 1500  Gross per 24 hour  Intake    740 ml  Output    225 ml  Net    515 ml   Filed Weights   10/03/13 0511 10/04/13 0522 10/05/13 0634  Weight: 64.003 kg (141 lb 1.6 oz) 64.411 kg (142 lb) 65.7 kg (144 lb 13.5 oz)    Exam:   General:  Middle aged cachectic male in no acute distress  Cardiovascular: Normal S1 and S2, no murmurs   Respiratory: bilateral breath sound, no wheezing, , has right percutaneous drain in place.   Abdomen: Soft, nontender, nondistended, bowel sounds normal  Musculoskeletal: no edema   Data Reviewed: Basic Metabolic Panel:  Recent Labs Lab 09/29/13 0830 09/30/13 0430 10/01/13 0430 10/02/13 1215 10/03/13 0505 10/04/13 0500  NA  --  144 141 139 140 138  K 3.0* 3.3* 3.3* 3.1* 3.2* 3.5*  CL  --  96 96 98 98 97  CO2  --  37* 33* 32 34* 31  GLUCOSE  --  132* 119* 110* 111* 109*  BUN  --  26* 24* 18 13 12   CREATININE  --  1.68* 1.58* 1.34 1.32 1.30  CALCIUM  --  9.4 9.0 8.4 8.6 8.4  MG 1.8  --  2.0  --   --   --    Liver Function Tests:  Recent Labs Lab 10/02/13 1215  PROT 5.2*   No results found for this basename: LIPASE, AMYLASE,  in the last 168 hours No results found for this basename: AMMONIA,  in the last 168 hours CBC:  Recent Labs Lab 09/30/13 0430 10/01/13 0430 10/02/13 1215 10/03/13 0505 10/04/13 0500  WBC 13.2* 15.4* 11.7* 11.6* 13.4*  HGB 13.4 12.9* 12.2* 12.9* 12.9*  HCT 42.1 42.3 38.9* 41.3 40.4  MCV 85.6 87.4 85.3 86.8 84.5  PLT 323 341 329 326 359   Cardiac Enzymes: No results found for this basename: CKTOTAL, CKMB, CKMBINDEX, TROPONINI,  in the last 168 hours BNP (last 3 results)  Recent Labs  09/24/13 1637  PROBNP 221.7*   CBG: No results found for this basename: GLUCAP,  in the last 168 hours  Recent Results (from the past 240 hour(s))  BODY FLUID CULTURE     Status: None   Collection Time    09/30/13  3:47 PM      Result Value Ref Range Status   Specimen  Description PLEURAL   Final   Special Requests NONE   Final   Gram Stain     Final   Value: MODERATE WBC PRESENT,BOTH PMN AND MONONUCLEAR     NO ORGANISMS SEEN     Performed at [facility]   Culture     Final   Value: NO GROWTH 3 DAYS     Performed at [facility]   Report Status 10/03/2013 FINAL   Final  CLOSTRIDIUM DIFFICILE BY PCR     Status: None   Collection Time    10/02/13  2:12 PM      Result Value Ref Range Status   C difficile by pcr NEGATIVE  NEGATIVE Final   Comment: Performed at [facility]  BODY FLUID CULTURE     Status: None   Collection Time    10/02/13  2:59 PM      Result Value Ref Range Status   Specimen Description PLEURAL   Final   Special Requests Normal   Final   Gram Stain  Final   Value: FEW WBC PRESENT,BOTH PMN AND MONONUCLEAR     NO ORGANISMS SEEN     Performed at [facility]   Culture     Final   Value: NO GROWTH 3 DAYS     Performed at [facility]   Report Status 10/05/2013 FINAL   Final     Studies: Dg Chest Port 1 View  10/05/2013   CLINICAL DATA:  Chest tube.  EXAM: PORTABLE CHEST - 1 VIEW  COMPARISON:  CT 10/02/2013.  Chest x-ray 09/30/2013.  FINDINGS: Surgical clips left apex. Port-A-Cath and right chest tube in stable position. Interim improvement of right hydro pneumothorax. Prominent fascial fluid remains. Bilateral patchy pulmonary densities are noted, these could be infectious or metastatic. Small left pleural effusion. Heart size stable. No acute osseous abnormality.  IMPRESSION: 1. Interim improvement of right hydro pneumothorax. Right chest tube in stable position. 2. Persistent ill-defined densities noted throughout both lungs. These could be infectious or metastatic.   Electronically Signed   By: Marcello Moores  Register   On: 10/05/2013 07:25    Scheduled Meds: . amLODipine  10 mg Oral QHS  . antiseptic oral rinse  15 mL Mouth Rinse BID  . enoxaparin (LOVENOX) injection  40 mg Subcutaneous Q24H   . fluticasone  1 spray Each Nare Daily  . folic acid  0.5 mg Oral Daily  . guaiFENesin  1,200 mg Oral BID  . ipratropium-albuterol  3 mL Nebulization TID  . metoprolol succinate  50 mg Oral QHS  . multivitamin with minerals  1 tablet Oral Daily  . OxyCODONE  40 mg Oral TID  . piperacillin-tazobactam (ZOSYN)  IV  3.375 g Intravenous 3 times per day  . potassium chloride  40 mEq Oral BID  . sertraline  100 mg Oral QHS  . sodium chloride  3 mL Intravenous Q12H  . vancomycin  750 mg Intravenous Q12H   Continuous Infusions:    Time spent: 25 minutes    Lorane Cousar A  [facility] Pager [phone] If 7PM-7AM, please contact night-coverage at [email or web address], password [facility] 10/05/2013, 5:44 PM  LOS: 11 days

## 2013-10-05 NOTE — Progress Notes (Signed)
INFECTIOUS DISEASE PROGRESS NOTE  ID: [name] [name] is a 58 y.o. male with  Principal Problem:   HCAP (healthcare-associated pneumonia) Active Problems:   Lung cancer   Recurrent right pleural effusion   Acute respiratory failure with hypoxia   Stage III chronic kidney disease   V-tach   Hypokalemia   Protein-calorie malnutrition, severe  Subjective: Denies SOB, chest pain.   Abtx:  Anti-infectives   Start     Dose/Rate Route Frequency Ordered Stop   10/03/13 1800  vancomycin (VANCOCIN) IVPB 750 mg/150 ml premix     750 mg 150 mL/hr over 60 Minutes Intravenous Every 12 hours 10/03/13 0736     09/25/13 0600  piperacillin-tazobactam (ZOSYN) IVPB 3.375 g     3.375 g 12.5 mL/hr over 240 Minutes Intravenous 3 times per day 09/24/13 2001     09/25/13 0600  vancomycin (VANCOCIN) 500 mg in sodium chloride 0.9 % 100 mL IVPB  Status:  Discontinued     500 mg 100 mL/hr over 60 Minutes Intravenous Every 12 hours 09/24/13 2001 10/03/13 0736   09/24/13 1800  piperacillin-tazobactam (ZOSYN) IVPB 3.375 g     3.375 g 12.5 mL/hr over 240 Minutes Intravenous  Once 09/24/13 1737 09/25/13 0311   09/24/13 1745  vancomycin (VANCOCIN) IVPB 1000 mg/200 mL premix     1,000 mg 200 mL/hr over 60 Minutes Intravenous  Once 09/24/13 1737 09/24/13 2000      Medications:  Scheduled: . amLODipine  10 mg Oral QHS  . antiseptic oral rinse  15 mL Mouth Rinse BID  . enoxaparin (LOVENOX) injection  40 mg Subcutaneous Q24H  . fluticasone  1 spray Each Nare Daily  . folic acid  0.5 mg Oral Daily  . guaiFENesin  1,200 mg Oral BID  . ipratropium-albuterol  3 mL Nebulization TID  . metoprolol succinate  50 mg Oral QHS  . multivitamin with minerals  1 tablet Oral Daily  . OxyCODONE  40 mg Oral TID  . piperacillin-tazobactam (ZOSYN)  IV  3.375 g Intravenous 3 times per day  . potassium chloride  40 mEq Oral BID  . sertraline  100 mg Oral QHS  . sodium chloride  3 mL Intravenous Q12H  . vancomycin  750 mg  Intravenous Q12H    Objective: Vital signs in last 24 hours: Temp:  [97.4 F (36.3 C)-98.2 F (36.8 C)] 97.4 F (36.3 C) (07/27 1433) Pulse Rate:  [80-84] 84 (07/27 1433) Resp:  [18-20] 20 (07/27 1433) BP: (120-131)/(71-74) 120/74 mmHg (07/27 1433) SpO2:  [93 %-97 %] 97 % (07/27 1433) Weight:  [65.7 kg (144 lb 13.5 oz)] 65.7 kg (144 lb 13.5 oz) (07/27 0634)   General appearance: alert, cooperative, no distress and pale Resp: clear to auscultation bilaterally Cardio: regular rate and rhythm GI: normal findings: bowel sounds normal and soft, non-tender  Lab Results  Recent Labs  10/03/13 0505 10/04/13 0500  WBC 11.6* 13.4*  HGB 12.9* 12.9*  HCT 41.3 40.4  NA 140 138  K 3.2* 3.5*  CL 98 97  CO2 34* 31  BUN 13 12  CREATININE 1.32 1.30   Liver Panel No results found for this basename: PROT, ALBUMIN, AST, ALT, ALKPHOS, BILITOT, BILIDIR, IBILI,  in the last 72 hours Sedimentation Rate No results found for this basename: ESRSEDRATE,  in the last 72 hours C-Reactive Protein No results found for this basename: CRP,  in the last 72 hours  Microbiology: Recent Results (from the past 240 hour(s))  BODY FLUID CULTURE  Status: None   Collection Time    09/30/13  3:47 PM      Result Value Ref Range Status   Specimen Description PLEURAL   Final   Special Requests NONE   Final   Gram Stain     Final   Value: MODERATE WBC PRESENT,BOTH PMN AND MONONUCLEAR     NO ORGANISMS SEEN     Performed at [facility]   Culture     Final   Value: NO GROWTH 3 DAYS     Performed at [facility]   Report Status 10/03/2013 FINAL   Final  CLOSTRIDIUM DIFFICILE BY PCR     Status: None   Collection Time    10/02/13  2:12 PM      Result Value Ref Range Status   C difficile by pcr NEGATIVE  NEGATIVE Final   Comment: Performed at [facility]  BODY FLUID CULTURE     Status: None   Collection Time    10/02/13  2:59 PM      Result Value Ref Range Status   Specimen  Description PLEURAL   Final   Special Requests Normal   Final   Gram Stain     Final   Value: FEW WBC PRESENT,BOTH PMN AND MONONUCLEAR     NO ORGANISMS SEEN     Performed at [facility]   Culture     Final   Value: NO GROWTH 3 DAYS     Performed at [facility]   Report Status 10/05/2013 FINAL   Final    Studies/Results: Dg Chest Port 1 View  10/05/2013   CLINICAL DATA:  Chest tube.  EXAM: PORTABLE CHEST - 1 VIEW  COMPARISON:  CT 10/02/2013.  Chest x-ray 09/30/2013.  FINDINGS: Surgical clips left apex. Port-A-Cath and right chest tube in stable position. Interim improvement of right hydro pneumothorax. Prominent fascial fluid remains. Bilateral patchy pulmonary densities are noted, these could be infectious or metastatic. Small left pleural effusion. Heart size stable. No acute osseous abnormality.  IMPRESSION: 1. Interim improvement of right hydro pneumothorax. Right chest tube in stable position. 2. Persistent ill-defined densities noted throughout both lungs. These could be infectious or metastatic.   Electronically Signed   By: Marcello Moores  Register   On: 10/05/2013 07:25     Assessment/Plan: Hydropneumothorax, loculated  Metastatic non-small cell adenoCA of lung  CRI (GFR 56)  Loose BM   Total days of antibiotics: 12 vanco/zosyn    WBC remains low but increased. C diff (-) Appreciate Dr Bari Mantis note, I discussed case with Dr [name]. The duration of therapy for him is unlcear- I suspect that it will be hard to clear his pleural space given malignancy and loculation. His pleural fluid after streptokinase certainly  Appears infectious (or malignant). Would consider changing him to augmentin for 2 more weeks. Available if questions.          Bobby Rumpf Infectious Diseases (pager) [phone] [email or web address] 10/05/2013, 3:14 PM  LOS: 11 days   **Disclaimer: This note may have been dictated with voice recognition software. Similar sounding words can  inadvertently be transcribed and this note may contain transcription errors which may not have been corrected upon publication of note.**

## 2013-10-05 NOTE — Progress Notes (Signed)
Name: [name] [name] MRN: [id number] DOB: 1955/06/06    ADMISSION DATE:  09/24/2013 CONSULTATION DATE:  7/24   REFERRING MD :  [name] PRIMARY SERVICE:  [facility]  CHIEF COMPLAINT:  Pleural effusion   BRIEF PATIENT DESCRIPTION:  58 year old male w/ NSCLC on palliative rx w/ Tarceva and right pleur-x. Last CT chest showed progression of disease on 7/15. Admitted w/ pneumonia +/- new infected pleural space on the right. PCCM was asked to see in regards to making recommendations about treatment options for possible complicated pleural effusion.   SIGNIFICANT EVENTS / STUDIES:  7/16 admitted w/ fever and PNA  Left thoracentesis 7/22: + malignant cells 1.4 litres 7/25-TPA through R chest tube by IR   CT chest 7/24>>>rt hydro pnx, decreased effusion.decreased left effusion  LINES / TUBES: Chronic pleur-x tube   CULTURES: Blood culture 7/22>>>ng Left Pleural fluid 7/22>>>neg  ANTIBIOTICS: Zosyn 7/16>>> vanc 7/16>>>7/27   SUBJECTIVE:  Feels great, very upbeat Afebrile Satn ok with tube clamped -90% RA, 94% on 2L     VITAL SIGNS: Temp:  [97.9 F (36.6 C)-98.5 F (36.9 C)] 97.9 F (36.6 C) (07/27 0272) Pulse Rate:  [80-84] 80 (07/27 0634) Resp:  [18-20] 20 (07/27 0634) BP: (123-140)/(51-71) 131/71 mmHg (07/27 0634) SpO2:  [93 %-97 %] 94 % (07/27 0835) Weight:  [144 lb 13.5 oz (65.7 kg)] 144 lb 13.5 oz (65.7 kg) (07/27 0634) Room air  PHYSICAL EXAMINATION: General:  Chronically ill appearing white male, in no acute distress. Very friendly/ talkative Neuro:  Awake, alert, no focal def   HEENT:  Poor dentition  Cardiovascular:  rrr Lungs:   decreased on right. R chest tube draining serosanguinous. 900 cc since tpa Abdomen:  Soft, nt no om  Musculoskeletal:  Intact  Skin:  intact   Recent Labs Lab 10/02/13 1215 10/03/13 0505 10/04/13 0500  NA 139 140 138  K 3.1* 3.2* 3.5*  CL 98 98 97  CO2 32 34* 31  BUN 18 13 12   CREATININE 1.34 1.32 1.30  GLUCOSE 110* 111* 109*      Recent Labs Lab 10/02/13 1215 10/03/13 0505 10/04/13 0500  HGB 12.2* 12.9* 12.9*  HCT 38.9* 41.3 40.4  WBC 11.7* 11.6* 13.4*  PLT 329 326 359   Dg Chest Port 1 View  10/05/2013   CLINICAL DATA:  Chest tube.  EXAM: PORTABLE CHEST - 1 VIEW  COMPARISON:  CT 10/02/2013.  Chest x-ray 09/30/2013.  FINDINGS: Surgical clips left apex. Port-A-Cath and right chest tube in stable position. Interim improvement of right hydro pneumothorax. Prominent fascial fluid remains. Bilateral patchy pulmonary densities are noted, these could be infectious or metastatic. Small left pleural effusion. Heart size stable. No acute osseous abnormality.  IMPRESSION: 1. Interim improvement of right hydro pneumothorax. Right chest tube in stable position. 2. Persistent ill-defined densities noted throughout both lungs. These could be infectious or metastatic.   Electronically Signed   By: Marcello Moores  Register   On: 10/05/2013 07:25    ASSESSMENT / PLAN:  NSCLCA w/ radiographic evidence of progression as well as new malignant left effusion Malignant bilateral pleural effusions Pneumonia (nos) Malignant left pleural effusion.  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 Would defer to dr Earlie Server if he wants markers sent on left pleural fluid specimen - I discussed with him. Please assess for O2 needs by ambulating on RA at discharge PCCM to sign off  Kara Mead MD. Shade Flood. [facility] Pager [phone] If no response call [phone] ]  10/05/2013, 11:49 AM

## 2013-10-05 NOTE — Progress Notes (Signed)
Subjective: Pt feels as if he is breathing better today; still with occ cough; very emotional regarding overall clinical situation Objective: Vital signs in last 24 hours: Temp:  [97.9 F (36.6 C)-98.5 F (36.9 C)] 97.9 F (36.6 C) (07/27 0634) Pulse Rate:  [80-84] 80 (07/27 0634) Resp:  [18-20] 20 (07/27 0634) BP: (123-140)/(51-71) 131/71 mmHg (07/27 0634) SpO2:  [93 %-97 %] 94 % (07/27 0835) Weight:  [144 lb 13.5 oz (65.7 kg)] 144 lb 13.5 oz (65.7 kg) (07/27 0634) Last BM Date: 10/04/13  Intake/Output from previous day: 07/26 0701 - 07/27 0700 In: 1170 [P.O.:720; IV Piggyback:450] Out: 225 [Chest Tube:225] Intake/Output this shift: Total I/O In: 240 [P.O.:240] Out: -   Rt pleurx cath intact, output over 1 liter last 48 hours  Lab Results:   Recent Labs  10/03/13 0505 10/04/13 0500  WBC 11.6* 13.4*  HGB 12.9* 12.9*  HCT 41.3 40.4  PLT 326 359   BMET  Recent Labs  10/03/13 0505 10/04/13 0500  NA 140 138  K 3.2* 3.5*  CL 98 97  CO2 34* 31  GLUCOSE 111* 109*  BUN 13 12  CREATININE 1.32 1.30  CALCIUM 8.6 8.4   PT/INR No results found for this basename: LABPROT, INR,  in the last 72 hours ABG No results found for this basename: PHART, PCO2, PO2, HCO3,  in the last 72 hours  Studies/Results: Dg Chest Port 1 View  10/05/2013   CLINICAL DATA:  Chest tube.  EXAM: PORTABLE CHEST - 1 VIEW  COMPARISON:  CT 10/02/2013.  Chest x-ray 09/30/2013.  FINDINGS: Surgical clips left apex. Port-A-Cath and right chest tube in stable position. Interim improvement of right hydro pneumothorax. Prominent fascial fluid remains. Bilateral patchy pulmonary densities are noted, these could be infectious or metastatic. Small left pleural effusion. Heart size stable. No acute osseous abnormality.  IMPRESSION: 1. Interim improvement of right hydro pneumothorax. Right chest tube in stable position. 2. Persistent ill-defined densities noted throughout both lungs. These could be infectious  or metastatic.   Electronically Signed   By: Marcello Moores  Register   On: 10/05/2013 07:25    Anti-infectives: Anti-infectives   Start     Dose/Rate Route Frequency Ordered Stop   10/03/13 1800  vancomycin (VANCOCIN) IVPB 750 mg/150 ml premix     750 mg 150 mL/hr over 60 Minutes Intravenous Every 12 hours 10/03/13 0736     09/25/13 0600  piperacillin-tazobactam (ZOSYN) IVPB 3.375 g     3.375 g 12.5 mL/hr over 240 Minutes Intravenous 3 times per day 09/24/13 2001     09/25/13 0600  vancomycin (VANCOCIN) 500 mg in sodium chloride 0.9 % 100 mL IVPB  Status:  Discontinued     500 mg 100 mL/hr over 60 Minutes Intravenous Every 12 hours 09/24/13 2001 10/03/13 0736   09/24/13 1800  piperacillin-tazobactam (ZOSYN) IVPB 3.375 g     3.375 g 12.5 mL/hr over 240 Minutes Intravenous  Once 09/24/13 1737 09/25/13 0311   09/24/13 1745  vancomycin (VANCOCIN) IVPB 1000 mg/200 mL premix     1,000 mg 200 mL/hr over 60 Minutes Intravenous  Once 09/24/13 1737 09/24/13 2000      Assessment/Plan: s/p TPA into rt pleurx 7/25 with increased output of effusion; CXR with improved rt hydroptx; case d/w Dr. Vernard Gambles- will cap pleurx today; may reconnect to pleuravac/suction if dyspnea increases ; if effusion recurs and drainage minimal can reinject TPA; other plans as per primary team.  LOS: 11 days    ALLRED,D [facility] 10/05/2013

## 2013-10-06 LAB — CBC
HEMATOCRIT: 41.6 % (ref 39.0–52.0)
Hemoglobin: 12.7 g/dL — ABNORMAL LOW (ref 13.0–17.0)
MCH: 26.6 pg (ref 26.0–34.0)
MCHC: 30.5 g/dL (ref 30.0–36.0)
MCV: 87 fL (ref 78.0–100.0)
Platelets: 421 10*3/uL — ABNORMAL HIGH (ref 150–400)
RBC: 4.78 MIL/uL (ref 4.22–5.81)
RDW: 14.2 % (ref 11.5–15.5)
WBC: 13.4 10*3/uL — AB (ref 4.0–10.5)

## 2013-10-06 LAB — BASIC METABOLIC PANEL
ANION GAP: 6 (ref 5–15)
BUN: 11 mg/dL (ref 6–23)
CHLORIDE: 101 meq/L (ref 96–112)
CO2: 37 meq/L — AB (ref 19–32)
Calcium: 8.5 mg/dL (ref 8.4–10.5)
Creatinine, Ser: 1.42 mg/dL — ABNORMAL HIGH (ref 0.50–1.35)
GFR calc non Af Amer: 53 mL/min — ABNORMAL LOW (ref >90)
GFR, EST AFRICAN AMERICAN: 61 mL/min — AB (ref >90)
Glucose, Bld: 106 mg/dL — ABNORMAL HIGH (ref 70–99)
POTASSIUM: 4.3 meq/L (ref 3.7–5.3)
Sodium: 144 mEq/L (ref 137–147)

## 2013-10-06 MED ORDER — AMOXICILLIN-POT CLAVULANATE 875-125 MG PO TABS: 1.0000 | ORAL_TABLET | Freq: Two times a day (BID) | ORAL | Status: DC

## 2013-10-06 MED ORDER — IPRATROPIUM-ALBUTEROL 0.5-2.5 (3) MG/3ML IN SOLN: 3.0000 mL | Freq: Three times a day (TID) | RESPIRATORY_TRACT | Status: AC

## 2013-10-06 MED ORDER — GUAIFENESIN ER 600 MG PO TB12: 1200.0000 mg | ORAL_TABLET | Freq: Two times a day (BID) | ORAL | Status: AC

## 2013-10-06 MED ORDER — MAGIC MOUTHWASH W/LIDOCAINE: 5.0000 mL | Freq: Four times a day (QID) | ORAL | Status: DC | PRN

## 2013-10-06 MED ORDER — BIOTENE DRY MOUTH MT LIQD
15.0000 mL | Freq: Two times a day (BID) | OROMUCOSAL | Status: DC
Start: 2013-10-06 — End: 2013-11-04

## 2013-10-06 NOTE — Progress Notes (Signed)
[name] O2 sats at rest on room air dropped to 88%. Placed on 2L O2 and sats returned to 93%. Callie Fielding RN

## 2013-10-06 NOTE — Progress Notes (Signed)
Subjective: Pt feels better. Ready for discharge today.  Objective: Vital signs in last 24 hours: Temp:  [97.4 F (36.3 C)-98.5 F (36.9 C)] 97.8 F (36.6 C) (07/28 0512) Pulse Rate:  [84-87] 87 (07/28 0512) Resp:  [18-20] 18 (07/28 0512) BP: (120-133)/(70-74) 131/70 mmHg (07/28 0512) SpO2:  [91 %-97 %] 96 % (07/28 0820) Weight:  [146 lb 9.6 oz (66.497 kg)] 146 lb 9.6 oz (66.497 kg) (07/28 0512) Last BM Date: 10/06/13   Rt pleurx cath intact. Hooked to gravity bag prior to discharge today. After 30 minutes, ~50cc clear yellow output  Lab Results:   Recent Labs  10/04/13 0500 10/06/13 0519  WBC 13.4* 13.4*  HGB 12.9* 12.7*  HCT 40.4 41.6  PLT 359 421*   BMET  Recent Labs  10/04/13 0500 10/06/13 0519  NA 138 144  K 3.5* 4.3  CL 97 101  CO2 31 37*  GLUCOSE 109* 106*  BUN 12 11  CREATININE 1.30 1.42*  CALCIUM 8.4 8.5   Studies/Results: Dg Chest Port 1 View  10/05/2013   CLINICAL DATA:  Chest tube.  EXAM: PORTABLE CHEST - 1 VIEW  COMPARISON:  CT 10/02/2013.  Chest x-ray 09/30/2013.  FINDINGS: Surgical clips left apex. Port-A-Cath and right chest tube in stable position. Interim improvement of right hydro pneumothorax. Prominent fascial fluid remains. Bilateral patchy pulmonary densities are noted, these could be infectious or metastatic. Small left pleural effusion. Heart size stable. No acute osseous abnormality.  IMPRESSION: 1. Interim improvement of right hydro pneumothorax. Right chest tube in stable position. 2. Persistent ill-defined densities noted throughout both lungs. These could be infectious or metastatic.   Electronically Signed   By: Marcello Moores  Register   On: 10/05/2013 07:25    Anti-infectives: Anti-infectives   Start     Dose/Rate Route Frequency Ordered Stop   10/06/13 0000  amoxicillin-clavulanate (AUGMENTIN) 875-125 MG per tablet     1 tablet Oral 2 times daily 10/06/13 1118     10/03/13 1800  vancomycin (VANCOCIN) IVPB 750 mg/150 ml premix     750  mg 150 mL/hr over 60 Minutes Intravenous Every 12 hours 10/03/13 0736     09/25/13 0600  piperacillin-tazobactam (ZOSYN) IVPB 3.375 g     3.375 g 12.5 mL/hr over 240 Minutes Intravenous 3 times per day 09/24/13 2001     09/25/13 0600  vancomycin (VANCOCIN) 500 mg in sodium chloride 0.9 % 100 mL IVPB  Status:  Discontinued     500 mg 100 mL/hr over 60 Minutes Intravenous Every 12 hours 09/24/13 2001 10/03/13 0736   09/24/13 1800  piperacillin-tazobactam (ZOSYN) IVPB 3.375 g     3.375 g 12.5 mL/hr over 240 Minutes Intravenous  Once 09/24/13 1737 09/25/13 0311   09/24/13 1745  vancomycin (VANCOCIN) IVPB 1000 mg/200 mL premix     1,000 mg 200 mL/hr over 60 Minutes Intravenous  Once 09/24/13 1737 09/24/13 2000      Assessment/Plan: Pt to be discharged, he will resume care of pleural drain at home. Wife has enough supplies. Recommend drain every few days or when SOB. If minimal drainage or emptying right side does not help with sxs, he will need follow up CXR to see if (L)effusion has recurred. He may require repeat (L)thora.   LOS: 12 days    Ascencion Dike 10/06/2013

## 2013-10-06 NOTE — Discharge Instructions (Signed)
Resume previous [place] drain care and routine.

## 2013-10-06 NOTE — Progress Notes (Signed)
Aspira drain hooked up to gravity bag and drained for approximately 30 minutes. 50cc returned to bag. Pt tolerated well. Callie Fielding RN

## 2013-10-06 NOTE — Progress Notes (Signed)
ANTIBIOTIC CONSULT NOTE - Follow-up  Pharmacy Consult for Vancomycin/Zosyn Indication: HCAP  No Known Allergies  Patient Measurements: Height: 5\' 10"  (177.8 cm) Weight: 146 lb 9.6 oz (66.497 kg) IBW/kg (Calculated) : 73  Vital Signs: Temp: 97.8 F (36.6 C) (07/28 0512) Temp src: Oral (07/28 0512) BP: 131/70 mmHg (07/28 0512) Pulse Rate: 87 (07/28 0512) Intake/Output from previous day: 07/27 0701 - 07/28 0700 In: 780 [P.O.:480; IV Piggyback:300] Out: 110 [Chest Tube:110] Intake/Output from this shift:    Labs:  Recent Labs  10/04/13 0500 10/06/13 0519  WBC 13.4* 13.4*  HGB 12.9* 12.7*  PLT 359 421*  CREATININE 1.30 1.42*   Estimated Creatinine Clearance: 52.7 ml/min (by C-G formula based on Cr of 1.42). No results found for this basename: VANCOTROUGH, VANCOPEAK, VANCORANDOM, [place], GENTRANDOM, TOBRATROUGH, TOBRAPEAK, TOBRARND, AMIKACINPEAK, AMIKACINTROU, AMIKACIN,  in the last 72 hours   Microbiology: Recent Results (from the past 720 hour(s))  CULTURE, BLOOD (ROUTINE X 2)     Status: None   Collection Time    09/24/13  5:06 PM      Result Value Ref Range Status   Specimen Description BLOOD LEFT FOREARM   Final   Special Requests BOTTLES DRAWN AEROBIC AND ANAEROBIC 5 ML   Final   Culture  Setup Time     Final   Value: 09/24/2013 22:28     Performed at [facility]   Culture     Final   Value:        BLOOD CULTURE RECEIVED NO GROWTH TO DATE CULTURE WILL BE HELD FOR 5 DAYS BEFORE ISSUING A FINAL NEGATIVE REPORT     Performed at [facility]   Report Status 09/30/2013 FINAL   Final  CULTURE, BLOOD (ROUTINE X 2)     Status: None   Collection Time    09/24/13  5:06 PM      Result Value Ref Range Status   Specimen Description BLOOD RIGHT ANTECUBITAL   Final   Special Requests BOTTLES DRAWN AEROBIC AND ANAEROBIC 3 ML   Final   Culture  Setup Time     Final   Value: 09/24/2013 22:29     Performed at [facility]   Culture      Final   Value: NO GROWTH 5 DAYS     Note: FOR RESULT QUESTIONS PLEASE CALL MICRO AT [phone]     Performed at [facility]   Report Status 09/30/2013 FINAL   Final  URINE CULTURE     Status: None   Collection Time    09/24/13  5:11 PM      Result Value Ref Range Status   Specimen Description URINE, CLEAN CATCH   Final   Special Requests NONE   Final   Culture  Setup Time     Final   Value: 09/24/2013 21:06     Performed at [place] [facility]     Final   Value: NO GROWTH     Performed at [facility]   Culture     Final   Value: NO GROWTH     Performed at [facility]   Report Status 09/25/2013 FINAL   Final  BODY FLUID CULTURE     Status: None   Collection Time    09/30/13  3:47 PM      Result Value Ref Range Status   Specimen Description PLEURAL   Final   Special Requests NONE  Final   Gram Stain     Final   Value: MODERATE WBC PRESENT,BOTH PMN AND MONONUCLEAR     NO ORGANISMS SEEN     Performed at [facility]   Culture     Final   Value: NO GROWTH 3 DAYS     Performed at [facility]   Report Status 10/03/2013 FINAL   Final  CLOSTRIDIUM DIFFICILE BY PCR     Status: None   Collection Time    10/02/13  2:12 PM      Result Value Ref Range Status   C difficile by pcr NEGATIVE  NEGATIVE Final   Comment: Performed at [facility]  BODY FLUID CULTURE     Status: None   Collection Time    10/02/13  2:59 PM      Result Value Ref Range Status   Specimen Description PLEURAL   Final   Special Requests Normal   Final   Gram Stain     Final   Value: FEW WBC PRESENT,BOTH PMN AND MONONUCLEAR     NO ORGANISMS SEEN     Performed at [facility]   Culture     Final   Value: NO GROWTH 3 DAYS     Performed at [facility]   Report Status 10/05/2013 FINAL   Final   Assessment: 63 yom with h/o lung CA on chemo, R pleural effusion s/p perc drain, HTN, former smoker, stage III CKD. Presented to ED  with fever, productive cough, yellow sputum, nasal congestion/runny nose. Vanc and Zosyn ordered in ED x1 for HCAP  7/16 >>zosyn >> 7/16 >>vanco >>   Day #12 vanco/zosyn Tmax: Afebrile WBCs: elevated, 13.4 Renal: CKD - SCr 1.42 (near baseline) with CrCl ~ 60ml/min N  7/16 blood: NG F 7/16 urine: NGF 7/22 Pleural fluid: ng F 7/23 Sputum: sent 7/23 C.diff PCR: neg 7/24 Plueral fluid: ng F 7/24 Cdiff: negative  Drug level / dose changes info: 7/19: 0530 VT = 14.4 mcg/ml on vancomycin 500mg  q12h 7/25: 0500 VT = 12 mcg/ml on vancomycin 500mg  q12h  Goal of Therapy:  Vancomycin trough level 15-20 mcg/ml Appropriate antibiotic dosing for renal function; eradication of infection  Plan:   Continue Zosyn 3.375g IV Q8H infused over 4hrs.  Continue Vancomycin 750 mg IV q12h.  Obtain Vancomycin trough tonight at 1700  Follow up renal fxn and culture results.   Kizzie Furnish, PharmD Pager: [phone] 10/06/2013 1:46 PM

## 2013-10-06 NOTE — Discharge Summary (Signed)
Physician Discharge Summary  Santhosh Gulino BXI:[id number] DOB: Apr 07, 1955 DOA: 09/24/2013  PCP: Woody Seller, MD  Admit date: 09/24/2013 Discharge date: 10/06/2013  Time spent: 35 minutes  Recommendations for Outpatient Follow-up:  1. Needs to follow up with Dr Julien Nordmann for repeat Chest X ray to follow up Bilateral pleural effusion.  2. FU with IR as needed for repeated TPA into right Pleurex  3. Need B-met to follow renal function.   Discharge Diagnoses:    Recurrent right pleural effusion, hydropneumothorax, loculated.    Left side pleural effusion of malignancy.    HCAP (healthcare-associated pneumonia)   Metastatic non small cell  Lung cancer   Acute respiratory failure with hypoxia   Stage III chronic kidney disease   V-tach   Hypokalemia   Protein-calorie malnutrition, severe   Discharge Condition: Stable,   Diet recommendation: regular diet.   Filed Weights   10/04/13 0522 10/05/13 0634 10/06/13 0512  Weight: 64.411 kg (142 lb) 65.7 kg (144 lb 13.5 oz) 66.497 kg (146 lb 9.6 oz)    History of present illness:  [name] [name] is a 58 y.o. male with history of lung cancer on chemotherapy, right pleural effusion status post percutaneous drain, hypertension, former smoker, stage III chronic kidney disease, presented to the ED with the above complaints. Patient and significant other who is an Therapist, sports, at bedside provided history. We gives 2-3 days history of cough productive of intermittent yellow sputum, nasal congestion and runny nose. He was seen yesterday at outpatient oncology with CT chest, abdomen and pelvis which showed new versus worsening pulmonary metastatic disease, decreased right pleural effusion, decreased right-sided collapse with development of right greater than left patchy airspace disease and septal thickening suspicious for lymphangitic tumor spread versus infection versus drug toxicity and small to moderate left pleural effusion. CT abdomen showed small volume abdominal  pelvic ascites and extensive omental thickening consistent with peritoneal/omental metastasis. He was otherwise in his usual state of health until this morning when he was found to have a fever of 100.4, oxygen saturation in the 70s and transiently confused. Patient's right-sided chest tube is drained twice a week. Oncologist was contacted patient was advised to come to the ED. In the ED, creatinine was 1.492 WBC 16.1 which was normal yesterday, BNP 221.7 and chest x-ray suggested diffuse pulmonary infiltrates right greater than left favoring pneumonia. Patient was started on vancomycin and Zosyn and hospitalist admission was requested.   Hospital Course:  58 year old male with history of non-small cell lung cancer on chemotherapy (followed by Dr. Julien Nordmann), recurrent right pleural effusion status post percutaneous drain, former smoker, hypertension, chronic kidney disease presented to the ED with fever and productive cough for 2-3 days. He was seen in the oncology clinic one day prior to admission and had CT chest, abdomen and pelvis showing new versus worsening pulmonary metastatic disease, decreased right pleural effusion and decreased right-sided collapse with development of right greater than left patchy airspace disease with septal thickening suspicious for a metastatic tumor spread versus infection. CT of the abdomen showed small volume abdominal and pelvic ascites with extensive omental thickening consistent with peritoneal/omental metastases. Patient on the day of admission had a fever of 100.75F, oxygen saturation in the 70s and confused transiently. His oncologist was contacted and was ask to come to the ED.  In the ED patient had a white count of 16.1k, (was normal one day prior to admission) creatinine of 1.5, chest x-ray suggestive of diffuse pulmonary infiltrates R>L suggestive of pneumonia. Patient was  started on IV vancomycin and Zosyn and admitted to medical floor.   Assessment/Plan:  Acute  Hypoxic respiratory failure: Right side Hydropneumothorax loculated, malignant left side pleural effusion.  -Likely combination of Healthcare associated pneumonia, progressive lung cancer and recuren pleural effusions  -cultures from pleural fluid so far negative. He was initially requiring 6 L of oxygen.  Repeated CXR shows pulmonary edema with bilateral effusions with right lower lobe loculated hydropneumothorax. -Pulmonary consulted for further evaluation, care of right side hydropneumothorax , loculated. Pleurax catheter was hooked to suction. Also TPA was instill into pleural catheter. It started to drain more than 1 L of fluids. Patient also underwent left side thoracentesis.  Left side pleural fluid had malignant cell. He received IV vancomycin and Zosyn,for 10  days. His oxygen requirement has improved.  -Dr Johnnye Sima recommend 2 weeks of Augmentin. He was evaluated by pulmonary, patient probably not candidate for VAST.  Patient will need repeat Chest x ray, might need repeat left side thoracentesis. Might need repeat TPA to right side pleurex catheter.   Hypertension  Stable. Continue home medications   NSVT on 7/20-7/21  Resolved.  Hypokalemia and hypomagnesemia  Replaced.   Chronic pain  Resumed home oxycodone. I would avoid ibuprofen given his underlying renal disease. Dced neurontin as per wife's request as it was making patient "loopy".   Transient encephalopathy  Possibly related to hypoxia and underlying infection and has now resolved.   Metastatic non-small cell lung cancer  Followed by Dr. Julien Nordmann. D/w Dr Julien Nordmann who recommended to d/c tarceva as he plans on chemo as outpt.   Chronic kidney disease  Renal function around baseline. discontinued NSAIDs  Follow renal function.   protein calorie malnutrition, severe  seen by nutrition and added supplements   Procedures: Change chest tube valve connector at bedside on 7/17 Left thoracentesis.     Consultations:  IR  Pulmonary  Discharge Exam: Filed Vitals:   10/06/13 0512  BP: 131/70  Pulse: 87  Temp: 97.8 F (36.6 C)  Resp: 18    General: No distress.  Cardiovascular: S 1, S 2 RRR Respiratory: Crackles base, decreases breath sound. Right pleura Catheter in place.   Discharge Instructions You were cared for by a hospitalist during your hospital stay. If you have any questions about your discharge medications or the care you received while you were in the hospital after you are discharged, you can call the unit and asked to speak with the hospitalist on call if the hospitalist that took care of you is not available. Once you are discharged, your primary care physician will handle any further medical issues. Please note that NO REFILLS for any discharge medications will be authorized once you are discharged, as it is imperative that you return to your primary care physician (or establish a relationship with a primary care physician if you do not have one) for your aftercare needs so that they can reassess your need for medications and monitor your lab values.  Discharge Instructions   Diet general    Complete by:  As directed      Increase activity slowly    Complete by:  As directed             Medication List    STOP taking these medications       erlotinib 150 MG tablet  Commonly known as:  TARCEVA     gabapentin 300 MG capsule  Commonly known as:  NEURONTIN     ibuprofen 200 MG  tablet  Commonly known as:  ADVIL,MOTRIN      TAKE these medications       ALPRAZolam 1 MG tablet  Commonly known as:  XANAX  Take 1 mg by mouth 3 (three) times daily as needed for anxiety.     amLODipine 10 MG tablet  Commonly known as:  NORVASC  Take 10 mg by mouth at bedtime.     amoxicillin-clavulanate 875-125 MG per tablet  Commonly known as:  AUGMENTIN  Take 1 tablet by mouth 2 (two) times daily.     antiseptic oral rinse Liqd  15 mLs by Mouth Rinse route 2  (two) times daily.     ENSURE  Take 237 mLs by mouth 2 (two) times daily between meals.     fluticasone 50 MCG/ACT nasal spray  Commonly known as:  FLONASE  Place 1 spray into both nostrils daily as needed for allergies.     folic acid 588 MCG tablet  Commonly known as:  FOLVITE  Take 400 mcg by mouth daily.     guaiFENesin 600 MG 12 hr tablet  Commonly known as:  MUCINEX  Take 2 tablets (1,200 mg total) by mouth 2 (two) times daily.     ipratropium-albuterol 0.5-2.5 (3) MG/3ML Soln  Commonly known as:  DUONEB  Take 3 mLs by nebulization 3 (three) times daily.     magic mouthwash w/lidocaine Soln  Take 5 mLs by mouth 4 (four) times daily as needed for mouth pain.     metoprolol succinate 50 MG 24 hr tablet  Commonly known as:  TOPROL-XL  Take 50 mg by mouth at bedtime. Take with or immediately following a meal.     multivitamin with minerals Tabs tablet  Take 1 tablet by mouth daily.     OXYCONTIN 40 mg T12a 12 hr tablet  Generic drug:  OxyCODONE  Take 40 mg by mouth 3 (three) times daily.     Oxycodone HCl 20 MG Tabs  Take 20 mg by mouth 3 (three) times daily as needed (pain).     polyvinyl alcohol 1.4 % ophthalmic solution  Commonly known as:  LIQUIFILM TEARS  Place 1 drop into both eyes as needed for dry eyes.     promethazine 25 MG suppository  Commonly known as:  PHENERGAN  Place 25 mg rectally every 6 (six) hours as needed for nausea.     sertraline 100 MG tablet  Commonly known as:  ZOLOFT  Take 100 mg by mouth at bedtime.     zolpidem 10 MG tablet  Commonly known as:  AMBIEN  Take 10 mg by mouth at bedtime as needed for sleep.       No Known Allergies     Follow-up Information   Follow up with Woody Seller, MD.   Specialty:  Family Medicine   Contact information:   [address] [place] [address] [phone]       Follow up with Eilleen Kempf., MD. (you need chest x ray. )    Specialty:  Oncology   Contact information:    [place] [address] [phone]        The results of significant diagnostics from this hospitalization (including imaging, microbiology, ancillary and laboratory) are listed below for reference.    Significant Diagnostic Studies: Ct Abdomen Pelvis Wo Contrast  09/23/2013   CLINICAL DATA:  Lung cancer diagnosed 3/9. Chemotherapy complete. No current complaints. Malignant neoplasm of right upper lobe. Hypertension.  EXAM:  CT CHEST, ABDOMEN AND PELVIS WITHOUT CONTRAST  TECHNIQUE: Multidetector CT imaging of the chest, abdomen and pelvis was performed following the standard protocol without IV contrast.  COMPARISON:  06/03/2013  FINDINGS: CT CHEST FINDINGS  Lungs/Pleura: Secretions within the trachea. Patent airways, with progressive bronchial wall thickening to the lower lobes. Greater on the right.  Improved right lung aeration with decreased volume loss and compressive atelectasis. Persistent right lower lobe airspace disease and volume loss. Development of patchy right upper lobe airspace disease with septal thickening. More mild patchy airspace disease and septal thickening is seen within the left upper and less so left lower lobes.  Mild motion degradation throughout.  New or enlarged left lower lobe lung nodule at 5 mm on image 34/series 4. A 3 mm left lower lobe nodule on image 49 is also new or enlarged.  Small to moderate left-sided pleural effusion which is new since the prior CT. Circumferential right-sided pleural thickening with decreased right-sided pleural fluid. There is a right-sided small bore pleural drain which terminates over the right lung apex. Right pleural air which is likely secondary.  Heart/Mediastinum: Tortuous thoracic aorta. Normal heart size without pericardial effusion. Multivessel coronary artery atherosclerosis. Right-sided Port-A-Cath which terminates at the superior caval/ atrial junction.  No mediastinal or definite hilar adenopathy, given  limitations of unenhanced CT.  CT ABDOMEN AND PELVIS FINDINGS  Abdomen/Pelvis: New small volume abdominal pelvic ascites. Normal spleen, stomach, pancreas. Cholelithiasis. Normal adrenal glands. Mild bilateral renal cortical atrophy.  Aortic and branch vessel atherosclerosis. No retroperitoneal or retrocrural adenopathy. Normal colon and terminal ileum. Normal caliber of small bowel, without obstruction.  Development of omental thickening throughout. No well-defined omental mass.  No pelvic adenopathy. Decompressed urinary bladder. Normal prostate. Right inguinal nodes are not pathologic by size criteria. Slightly enlarged since the prior exam on image 107. Nonspecific.  Bones/Musculoskeletal: No acute osseous abnormality. Disc bulge at the lumbosacral junction.  IMPRESSION: CT CHEST IMPRESSION  1. New versus enlarged left-sided pulmonary nodules, most consistent with metastatic disease. 2. Mildly motion degraded evaluation the chest. 3. Decrease right pleural effusion since 06/03/2013. Right chest tube in place with presumed secondary pleural air. 4. Decreased right sided collapse with development of right greater than left patchy airspace disease and septal thickening. Morphology is suspicious for lymphangitic tumor spread. Infection or drug toxicity could look similar. 5. Small to moderate left pleural effusion is new.  CT ABDOMEN AND PELVIS IMPRESSION  1. Development of small volume abdominal pelvic ascites and extensive omental thickening, most consistent with peritoneal/omental metastasis. 2. Cholelithiasis.   Electronically Signed   By: Jeronimo Greaves M.D.   On: 09/23/2013 16:02   Dg Chest 1 View  09/30/2013   CLINICAL DATA:  Status post left thoracentesis.  EXAM: CHEST - 1 VIEW  COMPARISON:  PA and lateral chest 09/27/2013.  CT chest 09/23/2013.  FINDINGS: Left pleural effusion is markedly decreased after thoracentesis. No pneumothorax is identified. Port-A-Cath and right chest tube remain in place. Right  pleural effusion and airspace disease do not appear markedly changed. Air-fluid levels in the right lower chest are again seen.  IMPRESSION: Marked decrease in left pleural effusion after thoracentesis. Negative for pneumothorax.  No change in the patient's right pleural effusion with air-fluid levels identified and airspace disease in the right in the right chest.   Electronically Signed   By: Drusilla Kanner M.D.   On: 09/30/2013 14:57   Dg Chest 2 View  09/27/2013   CLINICAL DATA:  Followup pneumonia. Right  pleural effusion. History of lung carcinoma.  EXAM: CHEST  2 VIEW  COMPARISON:  09/25/2013.  FINDINGS: Bilateral pleural effusions are stable. There is hazy airspace lung opacity most evident centrally, right greater than left. This is also similar to the prior study allowing for differences in technique and patient positioning. Compare bubble at the right lateral lung base is consistent with a loculated hydro pneumothorax, without change from the prior exam.  Right-sided chest tube is stable. Stable right anterior chest wall Port-A-Cath.  IMPRESSION: 1. No significant change from the previous exam. Pulmonary edema with bilateral effusions. Right lower lung zone hydro pneumothorax, loculated. Right chest tube is stable.   Electronically Signed   By: Lajean Manes M.D.   On: 09/27/2013 11:56   Dg Chest 2 View (if Patient Has Fever And/or Copd)  09/24/2013   CLINICAL DATA:  Shortness of breath today, or recent diagnosis of pneumonia, has a chest tube, history hypertension, smoking, lung cancer  EXAM: CHEST  2 VIEW  COMPARISON:  04/10/2013 ; correlation CT chest 09/23/2013  FINDINGS: RIGHT subclavian Port-A-Cath stable with tip projecting over SVC.  RIGHT thoracostomy tubes with tips at apex and RIGHT base.  Enlargement of cardiac silhouette.  Pulmonary vascular congestion.  BILATERAL pleural effusions much greater on RIGHT.  Small loculated pneumothorax at inferior RIGHT hemi thorax.  Diffuse infiltrates  throughout both lungs greater in RIGHT upper lobe, favor pneumonia.  No pneumothorax.  Pulmonary metastases identified on preceding chest CT are less well visualized.  Bones demineralized.  IMPRESSION: Diffuse pulmonary infiltrates RIGHT greater than LEFT favor pneumonia.  Pulmonary metastases seen on recent CT are less well visualized radiographically.  Persistent RIGHT hydropneumothorax despite 2 thoracostomy tubes.   Electronically Signed   By: Lavonia Dana M.D.   On: 09/24/2013 17:28   Ct Chest Wo Contrast  10/02/2013   CLINICAL DATA:  Evaluate effusion.  History of lung carcinoma.  EXAM: CT CHEST WITHOUT CONTRAST  TECHNIQUE: Multidetector CT imaging of the chest was performed following the standard protocol without IV contrast.  COMPARISON:  Chest radiograph, 09/30/2013. Chest CTs, 09/23/2013 and 06/03/2013.  FINDINGS: Since the prior study, there has been increase in consolidation in the right mid and upper lung. Dense consolidation/atelectasis in the right lower lung is without change. Right-sided hydro pneumothorax has mildly increased, measuring 4.7 cm in thickness at the level of the inferior vena cava atrial confluence where it had measured 3.4 cm.  Pleural effusion on the left has decreased in size, approximately half the volume of the prior study.  Small pulmonary nodules noted on the prior study is stable. An area of focal confluent opacity in the left upper lobe has increased common measuring 2.2 cm x 1.8 cm in size.  Volume loss on the right is similar to the prior exam. Soft tissue contiguous with the lung consolidation/ atelectasis surrounds the right hilar structures, unchanged. No discrete mass is seen. No discrete enlarged mediastinal lymph nodes are seen. No left hilar masses or adenopathy.  Limited evaluation of the upper abdomen again shows ascites and evidence of omental/peritoneal metastatic disease.  No osteoblastic or osteolytic lesions.  IMPRESSION: 1. Right hydro pneumothorax has  mildly increased size from the prior study and consolidation in the right mid and upper lung has mildly increased. 2. There is a small area of focal airspace opacity in the left upper lobe that has increased in size from the prior exam. 3. Pleural effusion on the left has decreased in size. 4. Pulmonary nodules noted  previously consistent with metastatic disease are unchanged from the recent prior CT of the chest, abdomen and pelvis. Peritoneal and omental metastatic disease noted on the prior exam is also unchanged.   Electronically Signed   By: Lajean Manes M.D.   On: 10/02/2013 16:30   Ct Chest Wo Contrast  09/23/2013   CLINICAL DATA:  Lung cancer diagnosed 3/9. Chemotherapy complete. No current complaints. Malignant neoplasm of right upper lobe. Hypertension.  EXAM: CT CHEST, ABDOMEN AND PELVIS WITHOUT CONTRAST  TECHNIQUE: Multidetector CT imaging of the chest, abdomen and pelvis was performed following the standard protocol without IV contrast.  COMPARISON:  06/03/2013  FINDINGS: CT CHEST FINDINGS  Lungs/Pleura: Secretions within the trachea. Patent airways, with progressive bronchial wall thickening to the lower lobes. Greater on the right.  Improved right lung aeration with decreased volume loss and compressive atelectasis. Persistent right lower lobe airspace disease and volume loss. Development of patchy right upper lobe airspace disease with septal thickening. More mild patchy airspace disease and septal thickening is seen within the left upper and less so left lower lobes.  Mild motion degradation throughout.  New or enlarged left lower lobe lung nodule at 5 mm on image 34/series 4. A 3 mm left lower lobe nodule on image 49 is also new or enlarged.  Small to moderate left-sided pleural effusion which is new since the prior CT. Circumferential right-sided pleural thickening with decreased right-sided pleural fluid. There is a right-sided small bore pleural drain which terminates over the right lung  apex. Right pleural air which is likely secondary.  Heart/Mediastinum: Tortuous thoracic aorta. Normal heart size without pericardial effusion. Multivessel coronary artery atherosclerosis. Right-sided Port-A-Cath which terminates at the superior caval/ atrial junction.  No mediastinal or definite hilar adenopathy, given limitations of unenhanced CT.  CT ABDOMEN AND PELVIS FINDINGS  Abdomen/Pelvis: New small volume abdominal pelvic ascites. Normal spleen, stomach, pancreas. Cholelithiasis. Normal adrenal glands. Mild bilateral renal cortical atrophy.  Aortic and branch vessel atherosclerosis. No retroperitoneal or retrocrural adenopathy. Normal colon and terminal ileum. Normal caliber of small bowel, without obstruction.  Development of omental thickening throughout. No well-defined omental mass.  No pelvic adenopathy. Decompressed urinary bladder. Normal prostate. Right inguinal nodes are not pathologic by size criteria. Slightly enlarged since the prior exam on image 107. Nonspecific.  Bones/Musculoskeletal: No acute osseous abnormality. Disc bulge at the lumbosacral junction.  IMPRESSION: CT CHEST IMPRESSION  1. New versus enlarged left-sided pulmonary nodules, most consistent with metastatic disease. 2. Mildly motion degraded evaluation the chest. 3. Decrease right pleural effusion since 06/03/2013. Right chest tube in place with presumed secondary pleural air. 4. Decreased right sided collapse with development of right greater than left patchy airspace disease and septal thickening. Morphology is suspicious for lymphangitic tumor spread. Infection or drug toxicity could look similar. 5. Small to moderate left pleural effusion is new.  CT ABDOMEN AND PELVIS IMPRESSION  1. Development of small volume abdominal pelvic ascites and extensive omental thickening, most consistent with peritoneal/omental metastasis. 2. Cholelithiasis.   Electronically Signed   By: Abigail Miyamoto M.D.   On: 09/23/2013 16:02   Ir Chest  Fluoro  09/08/2013   CLINICAL DATA:  Lung carcinoma and recurrent malignant right pleural effusion requiring prior placement of a pleural drainage catheter on 07/03/2013. There has been diminished return of fluid from the catheter and the patient has been referred for tunneled catheter removal.  EXAM: CHEST FLUOROSCOPY  COMPARISON:  07/03/2013  PROCEDURE: Indwelling right-sided tunneled pleural drainage catheter and surrounding  skin was prepped with chlorhexidine. A retention suture was cut. 1% lidocaine was infiltrated around the tube exit site. Fluoroscopy was performed of the catheter and right chest. Aspiration was performed via the catheter. Blunt dissection was performed around the catheter exit site. Additional aspiration was performed via the catheter.  Additional fluoroscopy was performed. A new Prolene retention suture was applied at the catheter exit site. A Vasoline gauze dressing was then placed over the catheter exit site.  FINDINGS: Initial fluoroscopy shows loculated pleural fluid at the lateral lung base. The pleural drainage catheter courses transversely in the lower chest and ascends in the medial pleural space. With initial aspiration, there was return of a several cm long tubular plug of fibrinous tissue. Over the course of several minutes, there was then return of approximately 350 mL of minimally blood-tinged pleural fluid. The subcutaneous cuff of the indwelling drainage catheter was located quite high in the tunnel and was difficult to reach via the catheter exit site.  After thoracentesis, there was return of air via the catheter. Fluoroscopy demonstrates an ex vacuo cavity containing air where fluid was present previously. Given this air remaining as well as the relatively moderate amount of residual fluid, decision was made not to remove the drainage catheter at this time. It is likely that the fibrinous tissue aspirated from the catheter was partially occluding the catheter lumen.   IMPRESSION: As above, the indwelling right tunneled pleural drainage catheter was not removed today due to return of 350 mL of fluid after aspiration of an occlusive fibrinous plug. In addition, ex vacuo air was present in the pleural space after drainage of fluid.   Electronically Signed   By: Aletta Edouard M.D.   On: 09/08/2013 16:33   Dg Chest Port 1 View  10/05/2013   CLINICAL DATA:  Chest tube.  EXAM: PORTABLE CHEST - 1 VIEW  COMPARISON:  CT 10/02/2013.  Chest x-ray 09/30/2013.  FINDINGS: Surgical clips left apex. Port-A-Cath and right chest tube in stable position. Interim improvement of right hydro pneumothorax. Prominent fascial fluid remains. Bilateral patchy pulmonary densities are noted, these could be infectious or metastatic. Small left pleural effusion. Heart size stable. No acute osseous abnormality.  IMPRESSION: 1. Interim improvement of right hydro pneumothorax. Right chest tube in stable position. 2. Persistent ill-defined densities noted throughout both lungs. These could be infectious or metastatic.   Electronically Signed   By: Marcello Moores  Register   On: 10/05/2013 07:25   Dg Chest Port 1 View  09/25/2013   CLINICAL DATA:  Hypoxia  EXAM: PORTABLE CHEST - 1 VIEW  COMPARISON:  Chest x-ray from yesterday  FINDINGS: No change in heart size or mediastinal contours.  There is a right IJ porta catheter which is in stable position. Right-sided small bore chest tube is also unchanged, tip at the apex.  Unchanged moderate, partially loculated right hydro pneumothorax. There is a small left pleural effusion which is unchanged. Increased interstitial coarsening throughout the lungs. Airspace disease and throughout the right lung appears similar to previous. There are peripheral ground-glass densities in the left lung. New bandlike opacity in the left lower lung.  IMPRESSION: 1. New pulmonary edema. 2. Right hydropneumothorax and left pleural effusion are stable from prior. 3. New left basilar opacity,  timing favoring atelectasis.   Electronically Signed   By: Jorje Guild M.D.   On: 09/25/2013 05:12   US Thoracentesis Asp Pleural Space W/img Guide  09/30/2013   CLINICAL DATA:  Metastatic lung cancer, shortness of  breath. Left-sided pleural effusion. Request diagnostic and therapeutic thoracentesis.  EXAM: ULTRASOUND GUIDED LEFT THORACENTESIS  COMPARISON:  None.  PROCEDURE: An ultrasound guided thoracentesis was thoroughly discussed with the patient and questions answered. The benefits, risks, alternatives and complications were also discussed. The patient understands and wishes to proceed with the procedure. Written consent was obtained.  Ultrasound was performed to localize and mark an adequate pocket of fluid in the left chest. The area was then prepped and draped in the normal sterile fashion. 1% Lidocaine was used for local anesthesia. Under ultrasound guidance a 19 gauge Yueh catheter was introduced. Thoracentesis was performed. The catheter was removed and a dressing applied.  Complications:  None immediate  FINDINGS: A total of approximately 1.4 L of amber/ blood-tinged fluid was removed. A fluid sample wassent for laboratory analysis.  IMPRESSION: Successful ultrasound guided left thoracentesis yielding 1.4 L of pleural fluid.  Read by: Ascencion Dike PA-C   Electronically Signed   By: Aletta Edouard M.D.   On: 09/30/2013 14:42    Microbiology: Recent Results (from the past 240 hour(s))  BODY FLUID CULTURE     Status: None   Collection Time    09/30/13  3:47 PM      Result Value Ref Range Status   Specimen Description PLEURAL   Final   Special Requests NONE   Final   Gram Stain     Final   Value: MODERATE WBC PRESENT,BOTH PMN AND MONONUCLEAR     NO ORGANISMS SEEN     Performed at [facility]   Culture     Final   Value: NO GROWTH 3 DAYS     Performed at [facility]   Report Status 10/03/2013 FINAL   Final  CLOSTRIDIUM DIFFICILE BY PCR     Status: None    Collection Time    10/02/13  2:12 PM      Result Value Ref Range Status   C difficile by pcr NEGATIVE  NEGATIVE Final   Comment: Performed at [place]     Status: None   Collection Time    10/02/13  2:59 PM      Result Value Ref Range Status   Specimen Description PLEURAL   Final   Special Requests Normal   Final   Gram Stain     Final   Value: FEW WBC PRESENT,BOTH PMN AND MONONUCLEAR     NO ORGANISMS SEEN     Performed at [facility]   Culture     Final   Value: NO GROWTH 3 DAYS     Performed at [facility]   Report Status 10/05/2013 FINAL   Final     Labs: Basic Metabolic Panel:  Recent Labs Lab 10/01/13 0430 10/02/13 1215 10/03/13 0505 10/04/13 0500 10/06/13 0519  NA 141 139 140 138 144  K 3.3* 3.1* 3.2* 3.5* 4.3  CL 96 98 98 97 101  CO2 33* 32 34* 31 37*  GLUCOSE 119* 110* 111* 109* 106*  BUN 24* _0 CREATININE 1.58* 1.34 1.32 1.30 1.42*  CALCIUM 9.0 8.4 8.6 8.4 8.5  MG 2.0  --   --   --   --    Liver Function Tests:  Recent Labs Lab 10/02/13 1215  PROT 5.2*   No results found for this basename: LIPASE, AMYLASE,  in the last 168 hours No results found for this basename: AMMONIA,  in the last 168  hours CBC:  Recent Labs Lab 10/01/13 0430 10/02/13 1215 10/03/13 0505 10/04/13 0500 10/06/13 0519  WBC 15.4* 11.7* 11.6* 13.4* 13.4*  HGB 12.9* 12.2* 12.9* 12.9* 12.7*  HCT 42.3 38.9* 41.3 40.4 41.6  MCV 87.4 85.3 86.8 84.5 87.0  PLT 341 329 326 359 421*   Cardiac Enzymes: No results found for this basename: CKTOTAL, CKMB, CKMBINDEX, TROPONINI,  in the last 168 hours BNP: BNP (last 3 results)  Recent Labs  09/24/13 1637  PROBNP 221.7*   CBG: No results found for this basename: GLUCAP,  in the last 168 hours     Signed:  Niel Hummer A  [facility] 10/06/2013, 11:18 AM

## 2013-10-08 ENCOUNTER — Telehealth: Payer: Self-pay | Admitting: Internal Medicine

## 2013-10-08 ENCOUNTER — Telehealth: Payer: Self-pay | Admitting: Medical Oncology

## 2013-10-08 ENCOUNTER — Other Ambulatory Visit: Payer: Self-pay | Admitting: Medical Oncology

## 2013-10-08 DIAGNOSIS — C3490 Malignant neoplasm of unspecified part of unspecified bronchus or lung: Secondary | ICD-10-CM

## 2013-10-08 NOTE — Telephone Encounter (Signed)
cld pt's girlfriend/spouse to confirm apts, vm was full and was no accepting any more calls. Mailed pt updated schedule...KJ

## 2013-10-08 NOTE — Telephone Encounter (Signed)
Requests follow up and thinks pt needs cxr next week.

## 2013-10-12 ENCOUNTER — Telehealth: Payer: Self-pay | Admitting: Medical Oncology

## 2013-10-12 NOTE — Telephone Encounter (Signed)
Confirmed appts

## 2013-10-13 ENCOUNTER — Other Ambulatory Visit: Payer: Medicare Other

## 2013-10-13 ENCOUNTER — Telehealth: Payer: Self-pay | Admitting: *Deleted

## 2013-10-13 ENCOUNTER — Ambulatory Visit: Payer: Medicare Other | Admitting: Internal Medicine

## 2013-10-13 NOTE — Telephone Encounter (Signed)
[name] wife called stating that they cannot make it to pt's f/u appt today.  Pt was having a panic attack and she states "I cannot do anything with him".  She wants to r/s his lab and f/u appt.  Asked if he has any medication for anxiety, she states "yes he does."  Onc tx schedule sent to the schedulers to r/s and appt.

## 2013-10-14 ENCOUNTER — Telehealth: Payer: Self-pay | Admitting: Internal Medicine

## 2013-10-14 ENCOUNTER — Telehealth: Payer: Self-pay | Admitting: *Deleted

## 2013-10-14 NOTE — Telephone Encounter (Signed)
Received refill request from [facility] regarding refilling the qty of drainage systems pt gets per week.  He has been draining 1x daily and the other is for 2-3x weekly.  Per Dr Vista Mink, pt does not need to be draining every day.  He can drain up to 2-3x weekly and is to call if there are any issues.  Spoke with pt's wife, she verbalized understanding.  Called and informed [facility] [phone].  SLJ

## 2013-10-14 NOTE — Telephone Encounter (Signed)
s.w. pt wife and adivsed on new appt.....she did not want to keep... she will call us back once she knows her sched from work

## 2013-10-20 ENCOUNTER — Other Ambulatory Visit: Payer: Self-pay | Admitting: Medical Oncology

## 2013-10-20 NOTE — Telephone Encounter (Signed)
Faxed order to [place] for nebulizer machine.Pt discharged with duoneb , but no nebulizer ordered by hospitalist.Phyllis notified.

## 2013-10-21 ENCOUNTER — Telehealth: Payer: Self-pay | Admitting: Medical Oncology

## 2013-10-21 NOTE — Telephone Encounter (Signed)
[employer] states that patient does not have qualifying diagnosis. She will call patient to offer payment out of pocket.

## 2013-10-21 NOTE — Telephone Encounter (Signed)
[employer] needs qualifying diagnosis for nebulizer.

## 2013-10-29 ENCOUNTER — Ambulatory Visit: Payer: Medicare Other | Admitting: Physician Assistant

## 2013-10-29 ENCOUNTER — Telehealth: Payer: Self-pay | Admitting: Internal Medicine

## 2013-10-29 ENCOUNTER — Other Ambulatory Visit: Payer: Medicare Other

## 2013-10-29 NOTE — Telephone Encounter (Signed)
returned pt call and lvm for pt to call back with time frame of when he wants to come back

## 2013-10-30 ENCOUNTER — Encounter (HOSPITAL_COMMUNITY): Payer: Self-pay | Admitting: Emergency Medicine

## 2013-10-30 ENCOUNTER — Inpatient Hospital Stay (HOSPITAL_COMMUNITY)
Admission: EM | Admit: 2013-10-30 | Discharge: 2013-11-05 | DRG: 180 | Disposition: A | Discharge status: Home / Self Care | Payer: Medicare Other | Attending: Internal Medicine | Admitting: Internal Medicine

## 2013-10-30 ENCOUNTER — Emergency Department (HOSPITAL_COMMUNITY): Payer: Medicare Other

## 2013-10-30 DIAGNOSIS — N183 Chronic kidney disease, stage 3 unspecified: Secondary | ICD-10-CM | POA: Diagnosis present

## 2013-10-30 DIAGNOSIS — J9601 Acute respiratory failure with hypoxia: Secondary | ICD-10-CM

## 2013-10-30 DIAGNOSIS — R0902 Hypoxemia: Secondary | ICD-10-CM | POA: Diagnosis present

## 2013-10-30 DIAGNOSIS — R0602 Shortness of breath: Secondary | ICD-10-CM | POA: Diagnosis not present

## 2013-10-30 DIAGNOSIS — R636 Underweight: Secondary | ICD-10-CM | POA: Diagnosis present

## 2013-10-30 DIAGNOSIS — I959 Hypotension, unspecified: Secondary | ICD-10-CM | POA: Diagnosis not present

## 2013-10-30 DIAGNOSIS — J9621 Acute and chronic respiratory failure with hypoxia: Secondary | ICD-10-CM | POA: Diagnosis present

## 2013-10-30 DIAGNOSIS — J90 Pleural effusion, not elsewhere classified: Secondary | ICD-10-CM | POA: Diagnosis present

## 2013-10-30 DIAGNOSIS — I129 Hypertensive chronic kidney disease with stage 1 through stage 4 chronic kidney disease, or unspecified chronic kidney disease: Secondary | ICD-10-CM | POA: Diagnosis present

## 2013-10-30 DIAGNOSIS — Z23 Encounter for immunization: Secondary | ICD-10-CM

## 2013-10-30 DIAGNOSIS — Z9221 Personal history of antineoplastic chemotherapy: Secondary | ICD-10-CM

## 2013-10-30 DIAGNOSIS — R188 Other ascites: Secondary | ICD-10-CM | POA: Diagnosis present

## 2013-10-30 DIAGNOSIS — J910 Malignant pleural effusion: Secondary | ICD-10-CM

## 2013-10-30 DIAGNOSIS — E876 Hypokalemia: Secondary | ICD-10-CM

## 2013-10-30 DIAGNOSIS — J189 Pneumonia, unspecified organism: Secondary | ICD-10-CM | POA: Diagnosis present

## 2013-10-30 DIAGNOSIS — F418 Other specified anxiety disorders: Secondary | ICD-10-CM | POA: Diagnosis present

## 2013-10-30 DIAGNOSIS — F341 Dysthymic disorder: Secondary | ICD-10-CM | POA: Diagnosis present

## 2013-10-30 DIAGNOSIS — C786 Secondary malignant neoplasm of retroperitoneum and peritoneum: Secondary | ICD-10-CM | POA: Diagnosis present

## 2013-10-30 DIAGNOSIS — Z79899 Other long term (current) drug therapy: Secondary | ICD-10-CM

## 2013-10-30 DIAGNOSIS — I472 Ventricular tachycardia, unspecified: Secondary | ICD-10-CM

## 2013-10-30 DIAGNOSIS — E43 Unspecified severe protein-calorie malnutrition: Secondary | ICD-10-CM | POA: Diagnosis present

## 2013-10-30 DIAGNOSIS — R180 Malignant ascites: Secondary | ICD-10-CM | POA: Diagnosis present

## 2013-10-30 DIAGNOSIS — G40909 Epilepsy, unspecified, not intractable, without status epilepticus: Secondary | ICD-10-CM | POA: Diagnosis present

## 2013-10-30 DIAGNOSIS — C3490 Malignant neoplasm of unspecified part of unspecified bronchus or lung: Secondary | ICD-10-CM | POA: Diagnosis not present

## 2013-10-30 DIAGNOSIS — Z9981 Dependence on supplemental oxygen: Secondary | ICD-10-CM

## 2013-10-30 DIAGNOSIS — I10 Essential (primary) hypertension: Secondary | ICD-10-CM | POA: Diagnosis present

## 2013-10-30 DIAGNOSIS — J9620 Acute and chronic respiratory failure, unspecified whether with hypoxia or hypercapnia: Secondary | ICD-10-CM | POA: Diagnosis present

## 2013-10-30 DIAGNOSIS — F17200 Nicotine dependence, unspecified, uncomplicated: Secondary | ICD-10-CM | POA: Diagnosis present

## 2013-10-30 DIAGNOSIS — Z809 Family history of malignant neoplasm, unspecified: Secondary | ICD-10-CM

## 2013-10-30 DIAGNOSIS — IMO0002 Reserved for concepts with insufficient information to code with codable children: Secondary | ICD-10-CM

## 2013-10-30 HISTORY — DX: Other specified anxiety disorders: F41.8

## 2013-10-30 HISTORY — DX: Pleural effusion, not elsewhere classified: J90

## 2013-10-30 HISTORY — DX: Pneumonia, unspecified organism: J18.9

## 2013-10-30 HISTORY — DX: Unspecified severe protein-calorie malnutrition: E43

## 2013-10-30 NOTE — ED Notes (Signed)
Pt to department via EMS.  Per report, pt is on O2 at home, and oxygen concentrator malfunctioned.  Per EMS, pt was very hypoxic upon arrival.  Pt arrived to department on NRB.  Pt alert and oriented upon arrival.

## 2013-10-31 ENCOUNTER — Emergency Department (HOSPITAL_COMMUNITY): Payer: Medicare Other

## 2013-10-31 ENCOUNTER — Encounter (HOSPITAL_COMMUNITY): Payer: Self-pay | Admitting: Internal Medicine

## 2013-10-31 ENCOUNTER — Inpatient Hospital Stay (HOSPITAL_COMMUNITY): Payer: Medicare Other

## 2013-10-31 DIAGNOSIS — F341 Dysthymic disorder: Secondary | ICD-10-CM | POA: Diagnosis present

## 2013-10-31 DIAGNOSIS — J910 Malignant pleural effusion: Secondary | ICD-10-CM | POA: Diagnosis present

## 2013-10-31 DIAGNOSIS — J9620 Acute and chronic respiratory failure, unspecified whether with hypoxia or hypercapnia: Secondary | ICD-10-CM | POA: Diagnosis present

## 2013-10-31 DIAGNOSIS — C3490 Malignant neoplasm of unspecified part of unspecified bronchus or lung: Secondary | ICD-10-CM | POA: Diagnosis present

## 2013-10-31 DIAGNOSIS — Z809 Family history of malignant neoplasm, unspecified: Secondary | ICD-10-CM | POA: Diagnosis not present

## 2013-10-31 DIAGNOSIS — E43 Unspecified severe protein-calorie malnutrition: Secondary | ICD-10-CM | POA: Diagnosis present

## 2013-10-31 DIAGNOSIS — I10 Essential (primary) hypertension: Secondary | ICD-10-CM | POA: Diagnosis present

## 2013-10-31 DIAGNOSIS — R188 Other ascites: Secondary | ICD-10-CM | POA: Diagnosis present

## 2013-10-31 DIAGNOSIS — C786 Secondary malignant neoplasm of retroperitoneum and peritoneum: Secondary | ICD-10-CM | POA: Diagnosis present

## 2013-10-31 DIAGNOSIS — J189 Pneumonia, unspecified organism: Secondary | ICD-10-CM | POA: Diagnosis present

## 2013-10-31 DIAGNOSIS — IMO0002 Reserved for concepts with insufficient information to code with codable children: Secondary | ICD-10-CM | POA: Diagnosis not present

## 2013-10-31 DIAGNOSIS — Z9981 Dependence on supplemental oxygen: Secondary | ICD-10-CM | POA: Diagnosis not present

## 2013-10-31 DIAGNOSIS — Z9221 Personal history of antineoplastic chemotherapy: Secondary | ICD-10-CM | POA: Diagnosis not present

## 2013-10-31 DIAGNOSIS — G40909 Epilepsy, unspecified, not intractable, without status epilepticus: Secondary | ICD-10-CM | POA: Diagnosis present

## 2013-10-31 DIAGNOSIS — N183 Chronic kidney disease, stage 3 unspecified: Secondary | ICD-10-CM | POA: Diagnosis present

## 2013-10-31 DIAGNOSIS — I959 Hypotension, unspecified: Secondary | ICD-10-CM | POA: Diagnosis not present

## 2013-10-31 DIAGNOSIS — R180 Malignant ascites: Secondary | ICD-10-CM | POA: Diagnosis present

## 2013-10-31 DIAGNOSIS — R0602 Shortness of breath: Secondary | ICD-10-CM | POA: Diagnosis present

## 2013-10-31 DIAGNOSIS — F418 Other specified anxiety disorders: Secondary | ICD-10-CM

## 2013-10-31 DIAGNOSIS — I129 Hypertensive chronic kidney disease with stage 1 through stage 4 chronic kidney disease, or unspecified chronic kidney disease: Secondary | ICD-10-CM | POA: Diagnosis present

## 2013-10-31 DIAGNOSIS — F17200 Nicotine dependence, unspecified, uncomplicated: Secondary | ICD-10-CM | POA: Diagnosis present

## 2013-10-31 DIAGNOSIS — R636 Underweight: Secondary | ICD-10-CM | POA: Diagnosis present

## 2013-10-31 DIAGNOSIS — Z79899 Other long term (current) drug therapy: Secondary | ICD-10-CM | POA: Diagnosis not present

## 2013-10-31 DIAGNOSIS — R0902 Hypoxemia: Secondary | ICD-10-CM | POA: Diagnosis present

## 2013-10-31 DIAGNOSIS — Z23 Encounter for immunization: Secondary | ICD-10-CM | POA: Diagnosis not present

## 2013-10-31 HISTORY — DX: Other specified anxiety disorders: F41.8

## 2013-10-31 HISTORY — DX: Pneumonia, unspecified organism: J18.9

## 2013-10-31 LAB — MRSA PCR SCREENING: MRSA by PCR: NEGATIVE

## 2013-10-31 LAB — COMPREHENSIVE METABOLIC PANEL
ALT: 11 U/L (ref 0–53)
AST: 20 U/L (ref 0–37)
Albumin: 2 g/dL — ABNORMAL LOW (ref 3.5–5.2)
Alkaline Phosphatase: 80 U/L (ref 39–117)
Anion gap: 8 (ref 5–15)
BILIRUBIN TOTAL: 0.2 mg/dL — AB (ref 0.3–1.2)
BUN: 14 mg/dL (ref 6–23)
CHLORIDE: 98 meq/L (ref 96–112)
CO2: 35 meq/L — AB (ref 19–32)
CREATININE: 1.06 mg/dL (ref 0.50–1.35)
Calcium: 8.8 mg/dL (ref 8.4–10.5)
GFR calc non Af Amer: 75 mL/min — ABNORMAL LOW (ref >90)
GFR, EST AFRICAN AMERICAN: 87 mL/min — AB (ref >90)
GLUCOSE: 117 mg/dL — AB (ref 70–99)
Potassium: 4.3 mEq/L (ref 3.7–5.3)
Sodium: 141 mEq/L (ref 137–147)
Total Protein: 5.4 g/dL — ABNORMAL LOW (ref 6.0–8.3)

## 2013-10-31 LAB — BLOOD GAS, ARTERIAL
Acid-Base Excess: 11 mmol/L — ABNORMAL HIGH (ref 0.0–2.0)
Bicarbonate: 35.7 mEq/L — ABNORMAL HIGH (ref 20.0–24.0)
FIO2: 100 %
O2 SAT: 94.7 %
PATIENT TEMPERATURE: 37
TCO2: 31.5 mmol/L (ref 0–100)
pCO2 arterial: 53 mmHg — ABNORMAL HIGH (ref 35.0–45.0)
pH, Arterial: 7.443 (ref 7.350–7.450)
pO2, Arterial: 73.1 mmHg — ABNORMAL LOW (ref 80.0–100.0)

## 2013-10-31 LAB — CBC WITH DIFFERENTIAL/PLATELET
Basophils Absolute: 0 10*3/uL (ref 0.0–0.1)
Basophils Relative: 0 % (ref 0–1)
EOS PCT: 1 % (ref 0–5)
Eosinophils Absolute: 0.1 10*3/uL (ref 0.0–0.7)
HEMATOCRIT: 40.8 % (ref 39.0–52.0)
Hemoglobin: 12.8 g/dL — ABNORMAL LOW (ref 13.0–17.0)
LYMPHS PCT: 5 % — AB (ref 12–46)
Lymphs Abs: 0.7 10*3/uL (ref 0.7–4.0)
MCH: 26.8 pg (ref 26.0–34.0)
MCHC: 31.4 g/dL (ref 30.0–36.0)
MCV: 85.5 fL (ref 78.0–100.0)
MONO ABS: 1 10*3/uL (ref 0.1–1.0)
Monocytes Relative: 8 % (ref 3–12)
NEUTROS ABS: 11.1 10*3/uL — AB (ref 1.7–7.7)
Neutrophils Relative %: 86 % — ABNORMAL HIGH (ref 43–77)
Platelets: 424 10*3/uL — ABNORMAL HIGH (ref 150–400)
RBC: 4.77 MIL/uL (ref 4.22–5.81)
RDW: 14.4 % (ref 11.5–15.5)
WBC: 12.9 10*3/uL — AB (ref 4.0–10.5)

## 2013-10-31 LAB — TROPONIN I

## 2013-10-31 LAB — BODY FLUID CELL COUNT WITH DIFFERENTIAL
Eos, Fluid: 1 %
LYMPHS FL: 62 %
Monocyte-Macrophage-Serous Fluid: 22 % — ABNORMAL LOW (ref 50–90)
NEUTROPHIL FLUID: 15 % (ref 0–25)
WBC FLUID: 454 uL (ref 0–1000)

## 2013-10-31 LAB — PROTEIN, TOTAL: TOTAL PROTEIN: 4.8 g/dL — AB (ref 6.0–8.3)

## 2013-10-31 LAB — PROTEIN, BODY FLUID: Total protein, fluid: 2.2 g/dL

## 2013-10-31 LAB — LACTATE DEHYDROGENASE, PLEURAL OR PERITONEAL FLUID: LD FL: 264 U/L — AB (ref 3–23)

## 2013-10-31 LAB — PROTIME-INR
INR: 1.05 (ref 0.00–1.49)
PROTHROMBIN TIME: 13.7 s (ref 11.6–15.2)

## 2013-10-31 LAB — LACTATE DEHYDROGENASE: LDH: 202 U/L (ref 94–250)

## 2013-10-31 MED ORDER — IPRATROPIUM-ALBUTEROL 0.5-2.5 (3) MG/3ML IN SOLN
3.0000 mL | Freq: Three times a day (TID) | RESPIRATORY_TRACT | Status: DC
  Administered 2013-10-31 – 2013-11-05 (×14): 3 mL via RESPIRATORY_TRACT
  Filled 2013-10-31 (×16): qty 3

## 2013-10-31 MED ORDER — SODIUM CHLORIDE 0.9 % IJ SOLN
3.0000 mL | Freq: Two times a day (BID) | INTRAMUSCULAR | Status: DC
  Administered 2013-10-31 – 2013-11-03 (×5): 3 mL via INTRAVENOUS

## 2013-10-31 MED ORDER — ALPRAZOLAM 1 MG PO TABS
1.0000 mg | ORAL_TABLET | Freq: Three times a day (TID) | ORAL | Status: DC | PRN
  Administered 2013-10-31 – 2013-11-05 (×13): 1 mg via ORAL
  Filled 2013-10-31 (×13): qty 1

## 2013-10-31 MED ORDER — PNEUMOCOCCAL VAC POLYVALENT 25 MCG/0.5ML IJ INJ
0.5000 mL | INJECTION | INTRAMUSCULAR | Status: AC
  Administered 2013-11-01: 0.5 mL via INTRAMUSCULAR
  Filled 2013-10-31 (×2): qty 0.5

## 2013-10-31 MED ORDER — CETYLPYRIDINIUM CHLORIDE 0.05 % MT LIQD
7.0000 mL | Freq: Two times a day (BID) | OROMUCOSAL | Status: DC
  Administered 2013-10-31 – 2013-11-04 (×9): 7 mL via OROMUCOSAL

## 2013-10-31 MED ORDER — OXYCODONE-ACETAMINOPHEN 5-325 MG PO TABS
1.0000 | ORAL_TABLET | Freq: Once | ORAL | Status: AC
  Administered 2013-10-31: 1 via ORAL
  Filled 2013-10-31: qty 1

## 2013-10-31 MED ORDER — SODIUM CHLORIDE 0.9 % IJ SOLN: 3.0000 mL | Freq: Two times a day (BID) | INTRAMUSCULAR | Status: DC

## 2013-10-31 MED ORDER — ONDANSETRON HCL 4 MG/2ML IJ SOLN
4.0000 mg | Freq: Four times a day (QID) | INTRAMUSCULAR | Status: DC | PRN
Start: 2013-10-31 — End: 2013-11-05

## 2013-10-31 MED ORDER — OXYCODONE HCL ER 40 MG PO T12A
40.0000 mg | EXTENDED_RELEASE_TABLET | Freq: Three times a day (TID) | ORAL | Status: DC
  Administered 2013-10-31 – 2013-11-05 (×16): 40 mg via ORAL
  Filled 2013-10-31: qty 1
  Filled 2013-10-31: qty 2
  Filled 2013-10-31: qty 1
  Filled 2013-10-31 (×7): qty 2
  Filled 2013-10-31: qty 1
  Filled 2013-10-31: qty 2
  Filled 2013-10-31: qty 1
  Filled 2013-10-31: qty 2
  Filled 2013-10-31 (×2): qty 1

## 2013-10-31 MED ORDER — VANCOMYCIN HCL IN DEXTROSE 750-5 MG/150ML-% IV SOLN
750.0000 mg | Freq: Two times a day (BID) | INTRAVENOUS | Status: DC
  Administered 2013-10-31 – 2013-11-04 (×8): 750 mg via INTRAVENOUS
  Filled 2013-10-31 (×9): qty 150

## 2013-10-31 MED ORDER — SODIUM CHLORIDE 0.9 % IJ SOLN
3.0000 mL | Freq: Two times a day (BID) | INTRAMUSCULAR | Status: DC
  Administered 2013-10-31 – 2013-11-03 (×3): 3 mL via INTRAVENOUS

## 2013-10-31 MED ORDER — PIPERACILLIN-TAZOBACTAM 3.375 G IVPB
3.3750 g | Freq: Three times a day (TID) | INTRAVENOUS | Status: DC
  Administered 2013-10-31 – 2013-11-04 (×13): 3.375 g via INTRAVENOUS
  Filled 2013-10-31 (×14): qty 50

## 2013-10-31 MED ORDER — PIPERACILLIN-TAZOBACTAM 3.375 G IVPB 30 MIN
3.3750 g | Freq: Once | INTRAVENOUS | Status: AC
  Administered 2013-10-31: 3.375 g via INTRAVENOUS
  Filled 2013-10-31: qty 50

## 2013-10-31 MED ORDER — OXYCODONE-ACETAMINOPHEN 5-325 MG PO TABS
1.0000 | ORAL_TABLET | ORAL | Status: DC | PRN
  Administered 2013-10-31 – 2013-11-04 (×4): 1 via ORAL
  Filled 2013-10-31 (×4): qty 1

## 2013-10-31 MED ORDER — SODIUM CHLORIDE 0.9 % IJ SOLN: 3.0000 mL | INTRAMUSCULAR | Status: DC | PRN

## 2013-10-31 MED ORDER — VANCOMYCIN HCL IN DEXTROSE 1-5 GM/200ML-% IV SOLN
1000.0000 mg | Freq: Once | INTRAVENOUS | Status: AC
  Administered 2013-10-31: 1000 mg via INTRAVENOUS
  Filled 2013-10-31: qty 200

## 2013-10-31 MED ORDER — ONDANSETRON HCL 4 MG PO TABS: 4.0000 mg | ORAL_TABLET | Freq: Four times a day (QID) | ORAL | Status: DC | PRN

## 2013-10-31 MED ORDER — GUAIFENESIN ER 600 MG PO TB12
1200.0000 mg | ORAL_TABLET | Freq: Two times a day (BID) | ORAL | Status: DC
  Administered 2013-10-31 – 2013-11-05 (×11): 1200 mg via ORAL
  Filled 2013-10-31 (×12): qty 2

## 2013-10-31 MED ORDER — SERTRALINE HCL 100 MG PO TABS
100.0000 mg | ORAL_TABLET | Freq: Every day | ORAL | Status: DC
  Administered 2013-10-31 – 2013-11-04 (×5): 100 mg via ORAL
  Filled 2013-10-31 (×6): qty 1

## 2013-10-31 MED ORDER — SODIUM CHLORIDE 0.9 % IV SOLN
250.0000 mL | INTRAVENOUS | Status: DC | PRN
Start: 2013-10-31 — End: 2013-11-05

## 2013-10-31 NOTE — Procedures (Signed)
Thoracentesis Procedure Note  Pre-operative Diagnosis: left effusion  Post-operative Diagnosis: same  Indications: Large left effusion with respiratory distress  Procedure Details  Consent: Informed consent was obtained. Risks of the procedure were discussed including: infection, bleeding, pain, pneumothorax.  Under sterile conditions the patient was positioned. Betadine solution and sterile drapes were utilized.  2% plain plain was used to anesthetize the left 7th rib space. Fluid was obtained without any difficulties and minimal blood loss.  A dressing was applied to the wound and wound care instructions were provided.   Findings 1800 ml of amber yellow pleural fluid was obtained. A sample was sent to Pathology for cytology and cell counts, as well as for infection analysis.  Complications:  None; patient tolerated the procedure well.          Condition: stable  Plan A follow up chest x-ray was ordered. Bed Rest for 2 hours. Tylenol 650 mg. for pain.  Attending Attestation: I performed the procedure. Pre-procedure US showed large effusion as echo free space bounded by chest wall, diaphragm & atelectatic lung. Post thora US showed smaller effusion, sliding lung was noted, no pneumothorax  Rigoberto Noel. MD

## 2013-10-31 NOTE — Consult Note (Signed)
Name: [name] [name] MRN: [id number] DOB: 1955/07/07    ADMISSION DATE:  10/30/2013 CONSULTATION DATE:  10/31/2013  REFERRING MD :  Rama PRIMARY SERVICE:  [facility]  CHIEF COMPLAINT:  Shortness of breath, hypoxia   SIGNIFICANT EVENTS / STUDIES:  09/24/13 admitted w/ fever and PNA  Left thoracentesis 7/22: + malignant cells 1.4 litres  7/25-TPA through R chest tube by IR  CT chest 7/24>>>rt hydro pnx, decreased effusion.decreased left effusion   HISTORY OF PRESENT ILLNESS:  58 y.o. male with history of lung cancer diagnosed 2009, s/p chemotherapy, right pleural effusion status post pleurX, hypertension, former smoker, stage III chronic kidney disease, presented to the ED w/ fever, chills and pleuritic type back pain and worsening cough, productive of yellow sputum. He was seen at outpatient oncology 7/15 with CT chest, abdomen and pelvis which showed new versus worsening pulmonary metastatic disease, decreased right pleural effusion, decreased right-sided collapse with development of right greater than left patchy airspace disease and septal thickening suspicious for lymphangitic tumor spread versus infection versus drug toxicity and small to moderate left pleural effusion. CT abdomen showed small volume abdominal pelvic ascites and extensive omental thickening consistent with peritoneal/omental metastasis. He was admitted and treated for pneumonia. He underwent left thoracentesis on 7/22 which showed marked + malignant effusion He had disease progression on Avastin and Alimta, and was placed on Tarceva.  He was admitted to any [facility] with worsening respiratory distress and hypoxia, CT again showed disease progression a large left effusion that had reaccumulated. He is now requiring a nonrebreather. He remained surprisingly up beat, denies sputum production or fevers  PAST MEDICAL HISTORY :  Past Medical History  Diagnosis Date  . Hypertension   . lung ca dx'd 05/2007    chemo comp 12/2009  . 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 Hx antineoplastic chemo 2011  . Pneumonia   . Postobstructive pneumonia, healthcare associated 10/31/2013  . Protein-calorie malnutrition, severe 10/01/2013  . Recurrent right pleural effusion 06/30/2013  . Depression with anxiety 10/31/2013   History reviewed. No pertinent past surgical history. Prior to Admission medications   Medication Sig Start Date End Date Taking? Authorizing Provider  ALPRAZolam Duanne Moron) 1 MG tablet Take 1 mg by mouth 3 (three) times daily as needed for anxiety.    Yes Historical Provider, MD  Alum & Mag Hydroxide-Simeth (MAGIC MOUTHWASH W/LIDOCAINE) SOLN Take 5 mLs by mouth 4 (four) times daily as needed for mouth pain. 10/06/13  Yes Belkys A Regalado, MD  amLODipine (NORVASC) 10 MG tablet Take 10 mg by mouth at bedtime.    Yes Historical Provider, MD  antiseptic oral rinse (BIOTENE) LIQD 15 mLs by Mouth Rinse route 2 (two) times daily. 10/06/13  Yes Belkys A Regalado, MD  ENSURE (ENSURE) Take 237 mLs by mouth 2 (two) times daily between meals.   Yes Historical Provider, MD  fluticasone (FLONASE) 50 MCG/ACT nasal spray Place 1 spray into both nostrils daily as needed for allergies.  11/27/12  Yes Historical Provider, MD  folic acid (FOLVITE) 027 MCG tablet Take 400 mcg by mouth daily.   Yes Historical Provider, MD  guaiFENesin (MUCINEX) 600 MG 12 hr tablet Take 2 tablets (1,200 mg total) by mouth 2 (two) times daily. 10/06/13  Yes Belkys A Regalado, MD  metoprolol succinate (TOPROL-XL) 50 MG 24 hr tablet Take 50 mg by mouth at bedtime. Take with or immediately following a meal.   Yes Historical Provider, MD  Multiple Vitamin (MULTIVITAMIN WITH MINERALS) TABS tablet Take 1 tablet by mouth daily.   Yes Historical Provider, MD  OxyCODONE (OXYCONTIN) 40 mg T12A 12 hr tablet Take 40 mg by mouth 3 (three) times daily.   Yes Historical Provider, MD  Oxycodone HCl 20 MG TABS Take 20 mg by mouth 3 (three) times daily as needed  (pain).   Yes Historical Provider, MD  polyvinyl alcohol (LIQUIFILM TEARS) 1.4 % ophthalmic solution Place 1 drop into both eyes as needed for dry eyes.   Yes Historical Provider, MD  sertraline (ZOLOFT) 100 MG tablet Take 100 mg by mouth at bedtime.    Yes Historical Provider, MD  zolpidem (AMBIEN) 10 MG tablet Take 10 mg by mouth at bedtime as needed for sleep.    Yes Historical Provider, MD  amoxicillin-clavulanate (AUGMENTIN) 875-125 MG per tablet Take 1 tablet by mouth 2 (two) times daily. 10/06/13   Belkys A Regalado, MD  ipratropium-albuterol (DUONEB) 0.5-2.5 (3) MG/3ML SOLN Take 3 mLs by nebulization 3 (three) times daily. 10/06/13   Belkys A Regalado, MD   No Known Allergies  FAMILY HISTORY:  Family History  Problem Relation Age of Onset  . Cancer Brother    SOCIAL HISTORY:  reports that he has been smoking Cigarettes.  He has been smoking about 0.00 packs per day. He has never used smokeless tobacco. He reports that he does not drink alcohol or use illicit drugs.  REVIEW OF SYSTEMS:   Constitutional: Negative for fever, chills, weight loss, malaise/fatigue and diaphoresis.  HENT: Negative for hearing loss, ear pain, nosebleeds, congestion, sore throat, neck pain, tinnitus and ear discharge.   Eyes: Negative for blurred vision, double vision, photophobia, pain, discharge and redness.  Respiratory: Negative for cough, hemoptysis, sputum production, shortness of breath, wheezing and stridor.   Cardiovascular: Negative for chest pain, palpitations, orthopnea, claudication, leg swelling and PND.  Gastrointestinal: Negative for heartburn, nausea, vomiting, abdominal pain, diarrhea, constipation, blood in stool and melena.  Genitourinary: Negative for dysuria, urgency, frequency, hematuria and flank pain.  Musculoskeletal: Negative for myalgias, back pain, joint pain and falls.  Skin: Negative for itching and rash.  Neurological: Negative for dizziness, tingling, tremors, sensory change,  speech change, focal weakness, seizures, loss of consciousness, weakness and headaches.  Endo/Heme/Allergies: Negative for environmental allergies and polydipsia. Does not bruise/bleed easily.  SUBJECTIVE:   VITAL SIGNS: Temp:  [96.8 F (36 C)-98 F (36.7 C)] 96.8 F (36 C) (08/22 0800) Pulse Rate:  [68-88] 72 (08/22 0605) Resp:  [21-40] 21 (08/22 0605) BP: (119-145)/(68-84) 129/68 mmHg (08/22 0605) SpO2:  [96 %-100 %] 98 % (08/22 0749) Weight:  [66.225 kg (146 lb)] 66.225 kg (146 lb) (08/21 2334)  PHYSICAL EXAMINATION: Gen. Pleasant, well-nourished, in no distress, normal affect ENT - no lesions, no post nasal drip Neck: No JVD, no thyromegaly, no carotid bruits Lungs: no use of accessory muscles, no dullness to percussion, decreased on left without rales or rhonchi  Cardiovascular: Rhythm regular, heart sounds  normal, no murmurs, no peripheral edema Abdomen: soft and non-tender, no hepatosplenomegaly, BS normal. Musculoskeletal: No deformities, no cyanosis or clubbing Neuro:  alert, non focal Skin:  Warm, no lesions/ rash    Recent Labs Lab 10/31/13 0133  NA 141  K 4.3  CL 98  CO2 35*  BUN 14  CREATININE 1.06  GLUCOSE 117*    Recent Labs Lab 10/31/13 0133  HGB 12.8*  HCT 40.8  WBC 12.9*  PLT 424*   Ct Chest Wo Contrast  10/31/2013   CLINICAL DATA:  58 year old male with shortness of breath, weakness, chest pain. Hypoxia. Initial encounter. Lung cancer.  EXAM: CT CHEST WITHOUT CONTRAST  TECHNIQUE: Multidetector CT imaging of the chest was performed following the standard protocol without IV contrast.  COMPARISON:  Portable chest radiograph 10/30/2013 and earlier.  FINDINGS: Left pleural effusion has progressed and is large. Increased left lung compressive atelectasis. Superimposed increased widespread nodular (tree-in-bud) as well as patchy peribronchovascular opacity.  Trachea remains patent. Loss of the right lower lobe airway as before. Stable confluent  peribronchovascular opacity in the aerated right lung. Chronic right pleural collection with pleural enhancement. Right side pleural drain remains in place. Size of this collection is stable. Small volume gas within as before.  No pericardial effusion. Stable mediastinal soft tissues. Right chest porta cath.  Increased upper abdominal ascites, small to moderate in volume. Otherwise stable visualized upper abdominal viscera.  Stable visualized osseous structures.  IMPRESSION: 1. Progressed left pleural effusion, large. Stable right pleural collection with pleural drain in place. 2. Increased widespread nodularity in the left lung suspicious for lymphangitic carcinoma, but might instead indicate acute viral/atypical pneumonia. 3. Stable abnormal right lung parenchyma, probably a combination of pulmonary tumor and postobstructive infection. 4. Increased ascites, small to moderate.   Electronically Signed   By: Lars Pinks M.D.   On: 10/31/2013 03:00   Dg Chest Portable 1 View  10/31/2013   CLINICAL DATA:  58 year old male with shortness of breath. Initial encounter. Lung cancer.  EXAM: PORTABLE CHEST - 1 VIEW  COMPARISON:  10/05/2013 and earlier.  FINDINGS: Portable AP upright view at 2342 hrs. Progressive opacification of the bilateral mid and lower lungs. Obscuration of the diaphragm. Right pleural drain or chest tube in place. No pneumothorax identified. Grossly stable mediastinal contours. Aerated left upper lobe appears stable. Visualized tracheal air column is within normal limits.  IMPRESSION: Progressive bilateral pulmonary opacity favored to reflect increasing pleural effusions. Right chest tube remains in place. No pneumothorax.   Electronically Signed   By: Lars Pinks M.D.   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  Hypoxic respiratory failure  Recommend  - Thoracentesis was performed on 2 L of fluid removed from the left chest. Chest x-ray post procedure does not show any pneumothorax. Hopefully this will help to dial down his oxygen requirements Unfortunately discussed seem to be the end of the road for him. The only thing we could possibly offer him as a left Pleurx catheter. I would suggest engaging him in active discussions about hospice care. He remains optimistic given his survival since 2009. May need his oncologist to weigh in on this.  Kara Mead MD. [facility] Flood. [facility] Pager [phone] If no response call [phone]    10/31/2013, 11:41 AM

## 2013-10-31 NOTE — Progress Notes (Signed)
Progress Note   [name] [name] FGH:[id number] DOB: 11/02/1955 DOA: 10/30/2013 PCP: Woody Seller, MD   Brief Narrative:   [name] [name] is an 58 y.o. 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 CT scan, done on admission, showed a large left pleural effusion, stable right pleural collection with pleural drain in place, increased widespread nodularity of the left lung suspicious for lymphangitic carcinoma and possible postobstructive pneumonia on the right.  Assessment/Plan:   Principal Problem:   Acute on chronic respiratory failure with hypoxia, multifactorial, with progressive metastatic lung cancer, malignant pleural effusions, and postobstructive pneumonia all contributory  Admitted to the SDU and placed on 10 L of oxygen by nonrebreather mask.  Pulmonary consultation pending.  Left thoracentesis planned.  Continue empiric vancomycin and Zosyn to cover healthcare associated postobstructive pneumonia. Followup blood cultures.  Active Problems:   Ascites  Likely malignant. May need paracentesis.    Metastatic non-small cell lung cancer  We'll notify Dr. Julien Nordmann of the patient's admission.  CODE STATUS discussed by admitting physician, patient wishes to remain a full code.    Bilateral malignant  pleural effusions   Has a Pleurx catheter on the right in place which can be drained as needed.  Left sided thoracentesis planned.    Stage III chronic kidney disease  Creatinine stable.    Hypertension  Norvasc and metoprolol currently on hold.     Protein-calorie malnutrition, severe / underweight  BMI 21.  Dietitian evaluation requested.    Depression and anxiety   Continue Xanax and Zoloft.    DVT Prophylaxis  Continue SCDs.  Code Status: Full. Family Communication: No family currently at the bedside. Disposition Plan: Home when stable.   IV Access:    Peripheral IV   Procedures and diagnostic studies:    Chest x-ray 10/30/13: Progressive bilateral pulmonary opacity favored to reflect increasing pleural effusions. Right chest tube remains in place. No pneumothorax.   CT chest 10/31/13: 1. Progressed left pleural effusion, large. Stable right pleural collection with pleural drain in place. 2. Increased widespread nodularity in the left lung suspicious for lymphangitic carcinoma, but might instead indicate acute viral/atypical pneumonia. 3. Stable abnormal right lung parenchyma, probably a combination of pulmonary tumor and postobstructive infection. 4. Increased ascites, small to moderate.     Medical Consultants:    Pulmonology   Other Consultants:    None.   Anti-Infectives:    Vancomycin 10/31/13--->  Zosyn 10/31/13--->  Subjective:   [name] feels less dyspneic this morning, although he still has a high O2 requirement.  LBM was today.  No N/V.  No significant pain.    Objective:    Filed Vitals:   10/31/13 0345 10/31/13 0400 10/31/13 0500 10/31/13 0605  BP:   132/77 129/68  Pulse: 81 69 76 72  Temp:    98 F (36.7 C)  TempSrc:      Resp:   26 21  Height:      Weight:  SpO2: 98% 98% 100% 99%   No intake or output data in the 24 hours ending 10/31/13 0702  Exam: Gen:  NAD, on NRM Cardiovascular:  RRR, No  M/R/G Respiratory:  Lungs with crackles on left, diminished in bases bilaterally Gastrointestinal:  Abdomen soft, NT/ND, + BS Extremities:  No C/E/C, SCDs on   Data Reviewed:    Labs: Basic Metabolic Panel:  Recent Labs Lab 10/31/13 0133  NA 141  K 4.3  CL 98  CO2 35*  GLUCOSE 117*  BUN 14  CREATININE 1.06  CALCIUM 8.8   GFR Estimated Creatinine Clearance: 70.3 ml/min (by C-G formula based on Cr of 1.06). Liver Function Tests:  Recent Labs Lab 10/31/13 0133  AST 20  ALT 11  ALKPHOS 80  BILITOT 0.2*  PROT 5.4*  ALBUMIN 2.0*   Coagulation profile  Recent Labs Lab 10/31/13 0346  INR 1.05    CBC:  Recent Labs Lab 10/31/13 0133  WBC 12.9*  NEUTROABS 11.1*  HGB 12.8*  HCT 40.8  MCV 85.5  PLT 424*   Cardiac Enzymes:  Recent Labs Lab 10/31/13 0133  TROPONINI <0.30   BNP (last 3 results)  Recent Labs  09/24/13 1637  PROBNP 221.7*   Microbiology Recent Results (from the past 240 hour(s))  CULTURE, BLOOD (ROUTINE X 2)     Status: None   Collection Time    10/31/13  1:33 AM      Result Value Ref Range Status   Specimen Description Blood RIGHT ANTECUBITAL   Final   Special Requests BOTTLES DRAWN AEROBIC AND ANAEROBIC 6CC   Final   Culture PENDING   Incomplete   Report Status PENDING   Incomplete  CULTURE, BLOOD (ROUTINE X 2)     Status: None   Collection Time    10/31/13  1:45 AM      Result Value Ref Range Status   Specimen Description Blood BLOOD LEFT HAND   Final   Special Requests BOTTLES DRAWN AEROBIC AND ANAEROBIC [place]   Final   Culture PENDING   Incomplete   Report Status PENDING   Incomplete     Medications:   . antiseptic oral rinse  7 mL Mouth Rinse BID  . guaiFENesin  1,200 mg Oral BID  . ipratropium-albuterol  3 mL Nebulization TID  . OxyCODONE  40 mg Oral 3 times per day  . piperacillin-tazobactam (ZOSYN)  IV  3.375 g Intravenous 3 times per day  . [START ON 11/01/2013] pneumococcal 23 valent vaccine  0.5 mL  Intramuscular Tomorrow-1000  . sertraline  100 mg Oral QHS  . sodium chloride  3 mL Intravenous Q12H  . sodium chloride  3 mL Intravenous Q12H  . vancomycin  750 mg Intravenous Q12H   Continuous Infusions:   Time spent: 30 minutes.   LOS: 1 day   RAMA,CHRISTINA  [facility] Pager [phone]. If unable to reach me by pager, please call my cell phone at [phone].  *Please refer to [email or web address], password [email or web address] to get updated schedule on who will round on this patient, as hospitalists switch teams weekly. If 7PM-7AM, please contact night-coverage at [email or web address], password [email or web address] for any overnight needs.  10/31/2013, 7:02 AM

## 2013-10-31 NOTE — H&P (Signed)
PCP:   Woody Seller, MD   Chief Complaint:  sob  HPI: 58 yo [name] h/o stage 4 lung cancer since 2008 with right chest tube for chronic recurrent rt pleural effusion, htn comes in with several days of worsening sob and hypoxia at home.  He is normally on 3 Liters oxygen at home, his wife is Therapist, sports at [employer] here in [place].  It was thought his oxygen tank was not functioning, but this was changed and he was still hypoxic at home so they came to ED.  Pt denies any cough or fevers.  No swelling.  No n/v/d.  He has been steadily loosing weight.  He says he is exhausted b/c he has not slept in 2 days due to his sob.  Ct shows worsening pleural effusions now bilaterally.  Review of Systems:  Positive and negative as per HPI otherwise all other systems are negative  Past Medical History: Past Medical History  Diagnosis Date  . Hypertension   . lung ca dx'd 05/2007    chemo comp 12/2009  . 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 Hx antineoplastic chemo 2011  . Pneumonia    History reviewed. No pertinent past surgical history.  Medications: Prior to Admission medications   Medication Sig Start Date End Date Taking? Authorizing Provider  ALPRAZolam Duanne Moron) 1 MG tablet Take 1 mg by mouth 3 (three) times daily as needed for anxiety.     Historical Provider, MD  Alum & Mag Hydroxide-Simeth (MAGIC MOUTHWASH W/LIDOCAINE) SOLN Take 5 mLs by mouth 4 (four) times daily as needed for mouth pain. 10/06/13   Belkys A Regalado, MD  amLODipine (NORVASC) 10 MG tablet Take 10 mg by mouth at bedtime.     Historical Provider, MD  amoxicillin-clavulanate (AUGMENTIN) 875-125 MG per tablet Take 1 tablet by mouth 2 (two) times daily. 10/06/13   Belkys A Regalado, MD  antiseptic oral rinse (BIOTENE) LIQD 15 mLs by Mouth Rinse route 2 (two) times daily. 10/06/13   Belkys A Regalado, MD  ENSURE (ENSURE) Take 237 mLs by mouth 2 (two) times daily between meals.    Historical Provider, MD   fluticasone (FLONASE) 50 MCG/ACT nasal spray Place 1 spray into both nostrils daily as needed for allergies.  11/27/12   Historical Provider, MD  folic acid (FOLVITE) 623 MCG tablet Take 400 mcg by mouth daily.    Historical Provider, MD  guaiFENesin (MUCINEX) 600 MG 12 hr tablet Take 2 tablets (1,200 mg total) by mouth 2 (two) times daily. 10/06/13   Belkys A Regalado, MD  ipratropium-albuterol (DUONEB) 0.5-2.5 (3) MG/3ML SOLN Take 3 mLs by nebulization 3 (three) times daily. 10/06/13   Belkys A Regalado, MD  metoprolol succinate (TOPROL-XL) 50 MG 24 hr tablet Take 50 mg by mouth at bedtime. Take with or immediately following a meal.    Historical Provider, MD  Multiple Vitamin (MULTIVITAMIN WITH MINERALS) TABS tablet Take 1 tablet by mouth daily.    Historical Provider, MD  OxyCODONE (OXYCONTIN) 40 mg T12A 12 hr tablet Take 40 mg by mouth 3 (three) times daily.    Historical Provider, MD  Oxycodone HCl 20 MG TABS Take 20 mg by mouth 3 (three) times daily as needed (pain).    Historical Provider, MD  polyvinyl alcohol (LIQUIFILM TEARS) 1.4 % ophthalmic solution Place 1 drop into both eyes as needed for dry eyes.    Historical Provider, MD  promethazine (PHENERGAN) 25 MG suppository  Place 25 mg rectally every 6 (six) hours as needed for nausea.  02/09/13   Historical Provider, MD  sertraline (ZOLOFT) 100 MG tablet Take 100 mg by mouth at bedtime.     Historical Provider, MD  zolpidem (AMBIEN) 10 MG tablet Take 10 mg by mouth at bedtime as needed for sleep.     Historical Provider, MD    Allergies:  No Known Allergies  Social History:  reports that he has been smoking Cigarettes.  He has been smoking about 0.00 packs per day. He has never used smokeless tobacco. He reports that he does not drink alcohol or use illicit drugs.  Family History: Family History  Problem Relation Age of Onset  . Cancer Brother     Physical Exam: Filed Vitals:   10/30/13 2334  BP: 145/77  Pulse: 76  Temp: 97.9  F (36.6 C)  TempSrc: Oral  Resp: 40  Height: 5\' 10"  (1.778 m)  Weight: 66.225 kg (146 lb)  SpO2: 100%   General appearance: alert, cooperative, cachectic and mild distress Head: Normocephalic, without obvious abnormality, atraumatic Eyes: negative Nose: Nares normal. Septum midline. Mucosa normal. No drainage or sinus tenderness. Neck: no JVD and supple, symmetrical, trachea midline Lungs: diminished breath sounds bilaterally Heart: regular rate and rhythm, S1, S2 normal, no murmur, click, rub or gallop Abdomen: soft, non-tender; bowel sounds normal; no masses,  no organomegaly Extremities: extremities normal, atraumatic, no cyanosis or edema Pulses: 2+ and symmetric Skin: Skin color, texture, turgor normal. No rashes or lesions Neurologic: Grossly normal  Labs on Admission:   Recent Labs  10/31/13 0133  NA 141  K 4.3  CL 98  CO2 35*  GLUCOSE 117*  BUN 14  CREATININE 1.06  CALCIUM 8.8    Recent Labs  10/31/13 0133  AST 20  ALT 11  ALKPHOS 80  BILITOT 0.2*  PROT 5.4*  ALBUMIN 2.0*    Recent Labs  10/31/13 0133  WBC 12.9*  NEUTROABS 11.1*  HGB 12.8*  HCT 40.8  MCV 85.5  PLT 424*    Recent Labs  10/31/13 0133  TROPONINI <0.30   Radiological Exams on Admission: Ct Chest Wo Contrast  10/31/2013   CLINICAL DATA:  58 year old [name] with shortness of breath, weakness, chest pain. Hypoxia. Initial encounter. Lung cancer.  EXAM: CT CHEST WITHOUT CONTRAST  TECHNIQUE: Multidetector CT imaging of the chest was performed following the standard protocol without IV contrast.  COMPARISON:  Portable chest radiograph 10/30/2013 and earlier.  FINDINGS: Left pleural effusion has progressed and is large. Increased left lung compressive atelectasis. Superimposed increased widespread nodular (tree-in-bud) as well as patchy peribronchovascular opacity.  Trachea remains patent. Loss of the right lower lobe airway as before. Stable confluent peribronchovascular opacity in the  aerated right lung. Chronic right pleural collection with pleural enhancement. Right side pleural drain remains in place. Size of this collection is stable. Small volume gas within as before.  No pericardial effusion. Stable mediastinal soft tissues. Right chest porta cath.  Increased upper abdominal ascites, small to moderate in volume. Otherwise stable visualized upper abdominal viscera.  Stable visualized osseous structures.  IMPRESSION: 1. Progressed left pleural effusion, large. Stable right pleural collection with pleural drain in place. 2. Increased widespread nodularity in the left lung suspicious for lymphangitic carcinoma, but might instead indicate acute viral/atypical pneumonia. 3. Stable abnormal right lung parenchyma, probably a combination of pulmonary tumor and postobstructive infection. 4. Increased ascites, small to moderate.   Electronically Signed   By: Lars Pinks  M.D.   On: 10/31/2013 03:00   Dg Chest Portable 1 View  10/31/2013   CLINICAL DATA:  58 year old [name] with shortness of breath. Initial encounter. Lung cancer.  EXAM: PORTABLE CHEST - 1 VIEW  COMPARISON:  10/05/2013 and earlier.  FINDINGS: Portable AP upright view at 2342 hrs. Progressive opacification of the bilateral mid and lower lungs. Obscuration of the diaphragm. Right pleural drain or chest tube in place. No pneumothorax identified. Grossly stable mediastinal contours. Aerated left upper lobe appears stable. Visualized tracheal air column is within normal limits.  IMPRESSION: Progressive bilateral pulmonary opacity favored to reflect increasing pleural effusions. Right chest tube remains in place. No pneumothorax.   Electronically Signed   By: Lars Pinks M.D.   On: 10/31/2013 00:40    Assessment/Plan  58 yo [name] with acute on chronic respiratory failure hypoxia with stage 4 lung cancer and worsening bilateral pleural effusions  Principal Problem:   Acute respiratory failure with hypoxia-  Due to progression of pleural  effusions likely from underlying cancer, cannot rule out infectious component.  On 10 Liters nrb, able to talk in truncated sentences and mentating good right now.  High risk of deterioration.  Transfer to [facility] to stepdown.  PCCM has been called for consultation on arrival.  Will likely improve with thoracentesis in am, will keep npo for now.  Will also cover with iv vanco and zosyn.    Active Problems:   Lung cancer   Recurrent right pleural effusion   Stage III chronic kidney disease   Protein-calorie malnutrition, severe  Code status discussed with patient.  Wishes full code and continued aggressive treatment for his cancer.  Transfer to [facility] stepdown unit.     Janiece Scovill A 10/31/2013, 3:45 AM

## 2013-10-31 NOTE — ED Provider Notes (Signed)
Medical screening examination/treatment/procedure(s) were conducted as a shared visit with non-physician practitioner(s) and myself.  I personally evaluated the patient during the encounter.  CRITICAL CARE Performed by: Hoy Morn Total critical care time: 35 Critical care time was exclusive of separately billable procedures and treating other patients. Critical care was necessary to treat or prevent imminent or life-threatening deterioration. Critical care was time spent personally by me on the following activities: development of treatment plan with patient and/or surrogate as well as nursing, discussions with consultants, evaluation of patient's response to treatment, examination of patient, obtaining history from patient or surrogate, ordering and performing treatments and interventions, ordering and review of laboratory studies, ordering and review of radiographic studies, pulse oximetry and re-evaluation of patient's condition.  Pt with hypoxia requiring 10 liters NRB mask. Full code. vanc and zosyn. Hypoxia likely secondary to worsening left pleural effusion. Will need transfer to [facility] stepdown, likely need for thoracentesis in AM by IR. coags added. hcap covered for. Spoke with PCCM who will consult on the patient in the AM  1. Hypoxia   2. HCAP (healthcare-associated pneumonia)   3. Pleural effusion   4. Malignant neoplasm of lung, unspecified laterality, unspecified part of lung     Ct Chest Wo Contrast  10/31/2013   CLINICAL DATA:  58 year old male with shortness of breath, weakness, chest pain. Hypoxia. Initial encounter. Lung cancer.  EXAM: CT CHEST WITHOUT CONTRAST  TECHNIQUE: Multidetector CT imaging of the chest was performed following the standard protocol without IV contrast.  COMPARISON:  Portable chest radiograph 10/30/2013 and earlier.  FINDINGS: Left pleural effusion has progressed and is large. Increased left lung compressive atelectasis. Superimposed increased widespread  nodular (tree-in-bud) as well as patchy peribronchovascular opacity.  Trachea remains patent. Loss of the right lower lobe airway as before. Stable confluent peribronchovascular opacity in the aerated right lung. Chronic right pleural collection with pleural enhancement. Right side pleural drain remains in place. Size of this collection is stable. Small volume gas within as before.  No pericardial effusion. Stable mediastinal soft tissues. Right chest porta cath.  Increased upper abdominal ascites, small to moderate in volume. Otherwise stable visualized upper abdominal viscera.  Stable visualized osseous structures.  IMPRESSION: 1. Progressed left pleural effusion, large. Stable right pleural collection with pleural drain in place. 2. Increased widespread nodularity in the left lung suspicious for lymphangitic carcinoma, but might instead indicate acute viral/atypical pneumonia. 3. Stable abnormal right lung parenchyma, probably a combination of pulmonary tumor and postobstructive infection. 4. Increased ascites, small to moderate.   Electronically Signed   By: Lars Pinks M.D.   On: 10/31/2013 03:00   Ct Chest Wo Contrast  10/02/2013   CLINICAL DATA:  Evaluate effusion.  History of lung carcinoma.  EXAM: CT CHEST WITHOUT CONTRAST  TECHNIQUE: Multidetector CT imaging of the chest was performed following the standard protocol without IV contrast.  COMPARISON:  Chest radiograph, 09/30/2013. Chest CTs, 09/23/2013 and 06/03/2013.  FINDINGS: Since the prior study, there has been increase in consolidation in the right mid and upper lung. Dense consolidation/atelectasis in the right lower lung is without change. Right-sided hydro pneumothorax has mildly increased, measuring 4.7 cm in thickness at the level of the inferior vena cava atrial confluence where it had measured 3.4 cm.  Pleural effusion on the left has decreased in size, approximately half the volume of the prior study.  Small pulmonary nodules noted on the  prior study is stable. An area of focal confluent opacity in the left upper lobe  has increased common measuring 2.2 cm x 1.8 cm in size.  Volume loss on the right is similar to the prior exam. Soft tissue contiguous with the lung consolidation/ atelectasis surrounds the right hilar structures, unchanged. No discrete mass is seen. No discrete enlarged mediastinal lymph nodes are seen. No left hilar masses or adenopathy.  Limited evaluation of the upper abdomen again shows ascites and evidence of omental/peritoneal metastatic disease.  No osteoblastic or osteolytic lesions.  IMPRESSION: 1. Right hydro pneumothorax has mildly increased size from the prior study and consolidation in the right mid and upper lung has mildly increased. 2. There is a small area of focal airspace opacity in the left upper lobe that has increased in size from the prior exam. 3. Pleural effusion on the left has decreased in size. 4. Pulmonary nodules noted previously consistent with metastatic disease are unchanged from the recent prior CT of the chest, abdomen and pelvis. Peritoneal and omental metastatic disease noted on the prior exam is also unchanged.   Electronically Signed   By: Lajean Manes M.D.   On: 10/02/2013 16:30   Dg Chest Portable 1 View  10/31/2013   CLINICAL DATA:  58 year old male with shortness of breath. Initial encounter. Lung cancer.  EXAM: PORTABLE CHEST - 1 VIEW  COMPARISON:  10/05/2013 and earlier.  FINDINGS: Portable AP upright view at 2342 hrs. Progressive opacification of the bilateral mid and lower lungs. Obscuration of the diaphragm. Right pleural drain or chest tube in place. No pneumothorax identified. Grossly stable mediastinal contours. Aerated left upper lobe appears stable. Visualized tracheal air column is within normal limits.  IMPRESSION: Progressive bilateral pulmonary opacity favored to reflect increasing pleural effusions. Right chest tube remains in place. No pneumothorax.   Electronically Signed    By: Lars Pinks M.D.   On: 10/31/2013 00:40   Dg Chest Port 1 View  10/05/2013   CLINICAL DATA:  Chest tube.  EXAM: PORTABLE CHEST - 1 VIEW  COMPARISON:  CT 10/02/2013.  Chest x-ray 09/30/2013.  FINDINGS: Surgical clips left apex. Port-A-Cath and right chest tube in stable position. Interim improvement of right hydro pneumothorax. Prominent fascial fluid remains. Bilateral patchy pulmonary densities are noted, these could be infectious or metastatic. Small left pleural effusion. Heart size stable. No acute osseous abnormality.  IMPRESSION: 1. Interim improvement of right hydro pneumothorax. Right chest tube in stable position. 2. Persistent ill-defined densities noted throughout both lungs. These could be infectious or metastatic.   Electronically Signed   By: Marcello Moores  Register   On: 10/05/2013 07:25  I personally reviewed the imaging tests through PACS system I reviewed available ER/hospitalization records through the EMR  Results for orders placed during the hospital encounter of 10/30/13  CULTURE, BLOOD (ROUTINE X 2)      Result Value Ref Range   Specimen Description Blood RIGHT ANTECUBITAL     Special Requests BOTTLES DRAWN AEROBIC AND ANAEROBIC 6CC     Culture PENDING     Report Status PENDING    CULTURE, BLOOD (ROUTINE X 2)      Result Value Ref Range   Specimen Description Blood BLOOD LEFT HAND     Special Requests BOTTLES DRAWN AEROBIC AND ANAEROBIC [place]     Culture PENDING     Report Status PENDING    CBC WITH DIFFERENTIAL      Result Value Ref Range   WBC 12.9 (*) 4.0 - 10.5 K/uL   RBC 4.77  4.22 - 5.81 MIL/uL   Hemoglobin 12.8 (*)  13.0 - 17.0 g/dL   HCT 40.8  39.0 - 52.0 %   MCV 85.5  78.0 - 100.0 fL   MCH 26.8  26.0 - 34.0 pg   MCHC 31.4  30.0 - 36.0 g/dL   RDW 14.4  11.5 - 15.5 %   Platelets 424 (*) 150 - 400 K/uL   Neutrophils Relative % 86 (*) 43 - 77 %   Neutro Abs 11.1 (*) 1.7 - 7.7 K/uL   Lymphocytes Relative 5 (*) 12 - 46 %   Lymphs Abs 0.7  0.7 - 4.0 K/uL    Monocytes Relative 8  3 - 12 %   Monocytes Absolute 1.0  0.1 - 1.0 K/uL   Eosinophils Relative 1  0 - 5 %   Eosinophils Absolute 0.1  0.0 - 0.7 K/uL   Basophils Relative 0  0 - 1 %   Basophils Absolute 0.0  0.0 - 0.1 K/uL  COMPREHENSIVE METABOLIC PANEL      Result Value Ref Range   Sodium 141  137 - 147 mEq/L   Potassium 4.3  3.7 - 5.3 mEq/L   Chloride 98  96 - 112 mEq/L   CO2 35 (*) 19 - 32 mEq/L   Glucose, Bld 117 (*) 70 - 99 mg/dL   BUN 14  6 - 23 mg/dL   Creatinine, Ser 1.06  0.50 - 1.35 mg/dL   Calcium 8.8  8.4 - 10.5 mg/dL   Total Protein 5.4 (*) 6.0 - 8.3 g/dL   Albumin 2.0 (*) 3.5 - 5.2 g/dL   AST 20  0 - 37 U/L   ALT 11  0 - 53 U/L   Alkaline Phosphatase 80  39 - 117 U/L   Total Bilirubin 0.2 (*) 0.3 - 1.2 mg/dL   GFR calc non Af Amer 75 (*) >90 mL/min   GFR calc Af Amer 87 (*) >90 mL/min   Anion gap 8  5 - 15  TROPONIN I      Result Value Ref Range   Troponin I <0.30  <0.30 ng/mL       Hoy Morn, MD 10/31/13 [phone]

## 2013-10-31 NOTE — ED Provider Notes (Signed)
CSN: [id number]     Arrival date & time 10/30/13  2326 History   First MD Initiated Contact with Patient 10/30/13 2328     Chief Complaint  Patient presents with  . Shortness of Breath     (Consider location/radiation/quality/duration/timing/severity/associated sxs/prior Treatment) Patient is a 58 y.o. male presenting with shortness of breath. The history is provided by the patient.  Shortness of Breath Associated symptoms: no abdominal pain, no chest pain, no cough, no neck pain and no wheezing     Past Medical History  Diagnosis Date  . Hypertension   . lung ca dx'd 05/2007    chemo comp 12/2009  . 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 Hx antineoplastic chemo 2011  . Pneumonia    History reviewed. No pertinent past surgical history. Family History  Problem Relation Age of Onset  . Cancer Brother    History  Substance Use Topics  . Smoking status: Light Tobacco Smoker    Types: Cigarettes  . Smokeless tobacco: Never Used  . Alcohol Use: No    Review of Systems  Constitutional: Negative for activity change.       All ROS Neg except as noted in HPI  HENT: Negative for nosebleeds.   Eyes: Negative for photophobia and discharge.  Respiratory: Positive for chest tightness and shortness of breath. Negative for cough and wheezing.   Cardiovascular: Negative for chest pain and palpitations.  Gastrointestinal: Negative for abdominal pain and blood in stool.  Genitourinary: Negative for dysuria, frequency and hematuria.  Musculoskeletal: Negative for arthralgias, back pain and neck pain.  Skin: Negative.   Neurological: Negative for dizziness, seizures and speech difficulty.  Psychiatric/Behavioral: Negative for hallucinations and confusion.      Allergies  Review of patient's allergies indicates no known allergies.  Home Medications   Prior to Admission medications   Medication Sig Start Date End Date Taking? Authorizing Provider   ALPRAZolam Duanne Moron) 1 MG tablet Take 1 mg by mouth 3 (three) times daily as needed for anxiety.     Historical Provider, MD  Alum & Mag Hydroxide-Simeth (MAGIC MOUTHWASH W/LIDOCAINE) SOLN Take 5 mLs by mouth 4 (four) times daily as needed for mouth pain. 10/06/13   Belkys A Regalado, MD  amLODipine (NORVASC) 10 MG tablet Take 10 mg by mouth at bedtime.     Historical Provider, MD  amoxicillin-clavulanate (AUGMENTIN) 875-125 MG per tablet Take 1 tablet by mouth 2 (two) times daily. 10/06/13   Belkys A Regalado, MD  antiseptic oral rinse (BIOTENE) LIQD 15 mLs by Mouth Rinse route 2 (two) times daily. 10/06/13   Belkys A Regalado, MD  ENSURE (ENSURE) Take 237 mLs by mouth 2 (two) times daily between meals.    Historical Provider, MD  fluticasone (FLONASE) 50 MCG/ACT nasal spray Place 1 spray into both nostrils daily as needed for allergies.  11/27/12   Historical Provider, MD  folic acid (FOLVITE) 546 MCG tablet Take 400 mcg by mouth daily.    Historical Provider, MD  guaiFENesin (MUCINEX) 600 MG 12 hr tablet Take 2 tablets (1,200 mg total) by mouth 2 (two) times daily. 10/06/13   Belkys A Regalado, MD  ipratropium-albuterol (DUONEB) 0.5-2.5 (3) MG/3ML SOLN Take 3 mLs by nebulization 3 (three) times daily. 10/06/13   Belkys A Regalado, MD  metoprolol succinate (TOPROL-XL) 50 MG 24 hr tablet Take 50 mg by mouth at bedtime. Take with or immediately following a meal.    Historical Provider,  MD  Multiple Vitamin (MULTIVITAMIN WITH MINERALS) TABS tablet Take 1 tablet by mouth daily.    Historical Provider, MD  OxyCODONE (OXYCONTIN) 40 mg T12A 12 hr tablet Take 40 mg by mouth 3 (three) times daily.    Historical Provider, MD  Oxycodone HCl 20 MG TABS Take 20 mg by mouth 3 (three) times daily as needed (pain).    Historical Provider, MD  polyvinyl alcohol (LIQUIFILM TEARS) 1.4 % ophthalmic solution Place 1 drop into both eyes as needed for dry eyes.    Historical Provider, MD  promethazine (PHENERGAN) 25 MG  suppository Place 25 mg rectally every 6 (six) hours as needed for nausea.  02/09/13   Historical Provider, MD  sertraline (ZOLOFT) 100 MG tablet Take 100 mg by mouth at bedtime.     Historical Provider, MD  zolpidem (AMBIEN) 10 MG tablet Take 10 mg by mouth at bedtime as needed for sleep.     Historical Provider, MD   BP 145/77  Pulse 76  Temp(Src) 97.9 F (36.6 C) (Oral)  Resp 40  Ht 5\' 10"  (1.778 m)  Wt 146 lb (66.225 kg)  BMI 20.95 kg/m2  SpO2 100% Physical Exam  Nursing note and vitals reviewed. Constitutional: He is oriented to person, place, and time. He appears well-developed and well-nourished.  Non-toxic appearance.  HENT:  Head: Normocephalic.  Right Ear: Tympanic membrane and external ear normal.  Left Ear: Tympanic membrane and external ear normal.  Eyes: EOM and lids are normal. Pupils are equal, round, and reactive to light.  Neck: Normal range of motion. Neck supple. Carotid bruit is not present.  Cardiovascular: Normal rate, regular rhythm, normal heart sounds, intact distal pulses and normal pulses.   Pulmonary/Chest: No respiratory distress. He has rales.   chest tube in the right chest wall. Decreased breath sounds at both bases, and the right lung area. There are rales and crackles on the right. There is no laboring of respiratory effort. Patient speaks in complete sentences with a rebreather mask in place.  Abdominal: Soft. Bowel sounds are normal. There is no guarding.  Musculoskeletal: Normal range of motion. He exhibits no edema.  Lymphadenopathy:       Head (right side): No submandibular adenopathy present.       Head (left side): No submandibular adenopathy present.    He has no cervical adenopathy.  Neurological: He is alert and oriented to person, place, and time. He has normal strength. No cranial nerve deficit or sensory deficit.  Skin: Skin is warm and dry.  Psychiatric: He has a normal mood and affect. His speech is normal.    ED Course  Procedures  (including critical care time) Labs Review Labs Reviewed - No data to display  Imaging Review Dg Chest Portable 1 View  10/31/2013   CLINICAL DATA:  58 year old male with shortness of breath. Initial encounter. Lung cancer.  EXAM: PORTABLE CHEST - 1 VIEW  COMPARISON:  10/05/2013 and earlier.  FINDINGS: Portable AP upright view at 2342 hrs. Progressive opacification of the bilateral mid and lower lungs. Obscuration of the diaphragm. Right pleural drain or chest tube in place. No pneumothorax identified. Grossly stable mediastinal contours. Aerated left upper lobe appears stable. Visualized tracheal air column is within normal limits.  IMPRESSION: Progressive bilateral pulmonary opacity favored to reflect increasing pleural effusions. Right chest tube remains in place. No pneumothorax.   Electronically Signed   By: Lars Pinks M.D.   On: 10/31/2013 00:40     EKG Interpretation None  MDM There is a 97.9, pulse rate 76, respiratory rate 40 (elevated), blood pressure 145/77. Pulse oximetry 100% on rebreather mask at 15 L per minute.  Chest x-ray reveals progressive bilateral pulmonary opacity which per the radiologist favors increasing pleural effusions. The right chest tube remains in place.  The patient will be taken off of the rebreather mask and placed back on his nasal cannula at his in  home oxygen rate. Will observe for hypoxia. Patient states he has gone through 3 or 4 oxygen concentrator is at home  Recently. Question if he has been receiving bad machines, or if he is having increasing problems with breathing. Question advancing pleural effusion vs progression of tumors.   The patient's care to be continued by Dr. Venora Maples.    Final diagnoses:  None    *I have reviewed nursing notes, vital signs, and all appropriate lab and imaging results for this patient.Lenox Ahr, PA-C 10/31/13 1621

## 2013-10-31 NOTE — Progress Notes (Signed)
ANTIBIOTIC CONSULT NOTE - INITIAL  Pharmacy Consult for Vancomycin and Zosyn  Indication: pneumonia  No Known Allergies  Patient Measurements: Height: 5\' 10"  (177.8 cm) Weight: 146 lb (66.225 kg) IBW/kg (Calculated) : 73 Adjusted Body Weight:   Vital Signs: Temp: 98 F (36.7 C) (08/22 0605) Temp src: Oral (08/21 2334) BP: 129/68 mmHg (08/22 0605) Pulse Rate: 72 (08/22 0605) Intake/Output from previous day:   Intake/Output from this shift:    Labs:  Recent Labs  10/31/13 0133  WBC 12.9*  HGB 12.8*  PLT 424*  CREATININE 1.06   Estimated Creatinine Clearance: 70.3 ml/min (by C-G formula based on Cr of 1.06). No results found for this basename: VANCOTROUGH, Corlis Leak, VANCORANDOM, [place], GENTPEAK, GENTRANDOM, TOBRATROUGH, TOBRAPEAK, TOBRARND, AMIKACINPEAK, AMIKACINTROU, AMIKACIN,  in the last 72 hours   Microbiology: Recent Results (from the past 720 hour(s))  CLOSTRIDIUM DIFFICILE BY PCR     Status: None   Collection Time    10/02/13  2:12 PM      Result Value Ref Range Status   C difficile by pcr NEGATIVE  NEGATIVE Final   Comment: Performed at [facility]     Status: None   Collection Time    10/02/13  2:59 PM      Result Value Ref Range Status   Specimen Description PLEURAL   Final   Special Requests Normal   Final   Gram Stain     Final   Value: FEW WBC PRESENT,BOTH PMN AND MONONUCLEAR     NO ORGANISMS SEEN     Performed at [facility]   Culture     Final   Value: NO GROWTH 3 DAYS     Performed at [facility]   Report Status 10/05/2013 FINAL   Final  CULTURE, BLOOD (ROUTINE X 2)     Status: None   Collection Time    10/31/13  1:33 AM      Result Value Ref Range Status   Specimen Description Blood RIGHT ANTECUBITAL   Final   Special Requests BOTTLES DRAWN AEROBIC AND ANAEROBIC [place]   Final   Culture PENDING   Incomplete   Report Status PENDING   Incomplete  CULTURE, BLOOD (ROUTINE X 2)     Status:  None   Collection Time    10/31/13  1:45 AM      Result Value Ref Range Status   Specimen Description Blood BLOOD LEFT HAND   Final   Special Requests BOTTLES DRAWN AEROBIC AND ANAEROBIC [facility]   Final   Culture PENDING   Incomplete   Report Status PENDING   Incomplete    Medical History: Past Medical History  Diagnosis Date  . Hypertension   . lung ca dx'd 05/2007    chemo comp 12/2009  . 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 Hx antineoplastic chemo 2011  . Pneumonia     Medications:  Anti-infectives   Start     Dose/Rate Route Frequency Ordered Stop   10/31/13 1800  vancomycin (VANCOCIN) IVPB 750 mg/150 ml premix     750 mg 150 mL/hr over 60 Minutes Intravenous Every 12 hours 10/31/13 0649     10/31/13 1000  piperacillin-tazobactam (ZOSYN) IVPB 3.375 g     3.375 g 12.5 mL/hr over 240 Minutes Intravenous 3 times per day 10/31/13 0649     10/31/13 0330  vancomycin (VANCOCIN) IVPB 1000 mg/200 mL premix  1,000 mg 200 mL/hr over 60 Minutes Intravenous  Once 10/31/13 0321 10/31/13 0445   10/31/13 0330  piperacillin-tazobactam (ZOSYN) IVPB 3.375 g     3.375 g 100 mL/hr over 30 Minutes Intravenous  Once 10/31/13 0321 10/31/13 0445     Assessment: Patient with r/o PNA.  First dose of antibiotics already given.  Goal of Therapy:  Vancomycin trough level 15-20 mcg/ml Zosyn based on renal function   Plan:  Measure antibiotic drug levels at steady state Follow up culture results Vancomycin 750mg  iv q12hr Zosyn 3.375g IV Q8H infused over 4hrs.   Tyler Deis, Shea Stakes [place] 10/31/2013,6:50 AM

## 2013-11-01 DIAGNOSIS — J189 Pneumonia, unspecified organism: Secondary | ICD-10-CM

## 2013-11-01 DIAGNOSIS — C3490 Malignant neoplasm of unspecified part of unspecified bronchus or lung: Secondary | ICD-10-CM | POA: Diagnosis not present

## 2013-11-01 DIAGNOSIS — J910 Malignant pleural effusion: Secondary | ICD-10-CM

## 2013-11-01 LAB — CBC
HCT: 39.6 % (ref 39.0–52.0)
HEMOGLOBIN: 12.3 g/dL — AB (ref 13.0–17.0)
MCH: 26.5 pg (ref 26.0–34.0)
MCHC: 31.1 g/dL (ref 30.0–36.0)
MCV: 85.3 fL (ref 78.0–100.0)
PLATELETS: 414 10*3/uL — AB (ref 150–400)
RBC: 4.64 MIL/uL (ref 4.22–5.81)
RDW: 15 % (ref 11.5–15.5)
WBC: 12.6 10*3/uL — AB (ref 4.0–10.5)

## 2013-11-01 LAB — BASIC METABOLIC PANEL
Anion gap: 7 (ref 5–15)
BUN: 18 mg/dL (ref 6–23)
CO2: 37 meq/L — AB (ref 19–32)
Calcium: 8.7 mg/dL (ref 8.4–10.5)
Chloride: 100 mEq/L (ref 96–112)
Creatinine, Ser: 1.27 mg/dL (ref 0.50–1.35)
GFR calc Af Amer: 70 mL/min — ABNORMAL LOW (ref >90)
GFR calc non Af Amer: 60 mL/min — ABNORMAL LOW (ref >90)
GLUCOSE: 103 mg/dL — AB (ref 70–99)
POTASSIUM: 4.2 meq/L (ref 3.7–5.3)
Sodium: 144 mEq/L (ref 137–147)

## 2013-11-01 LAB — FUNGAL STAIN: FUNGAL SMEAR: NONE SEEN

## 2013-11-01 MED ORDER — ALUM & MAG HYDROXIDE-SIMETH 200-200-20 MG/5ML PO SUSP
30.0000 mL | Freq: Four times a day (QID) | ORAL | Status: DC | PRN
  Filled 2013-11-01: qty 30

## 2013-11-01 NOTE — ED Provider Notes (Signed)
Medical screening examination/treatment/procedure(s) were conducted as a shared visit with non-physician practitioner(s) and myself.  I personally evaluated the patient during the encounter.  Please see my other note for complete details including critical care documentation and medical decision-making     Hoy Morn, MD 11/01/13 [phone]

## 2013-11-01 NOTE — Progress Notes (Signed)
Patient ID: Arjay Jaskiewicz, male   DOB: 1955/06/27, 58 y.o.   MRN: [id number]  CT surgery  I reviewed his history and CT scans. He has metastatic lung cancer since 2009 with a right pleurx catheter placed in the past for a malignant effusion and now has recurrent left pleural effusion. He had 1.4 L removed by thoracentesis on 7/22 and now admitted with dyspnea and had a CT scan showing a moderate left pleural effusion treated with left thoracentesis yesterday by Dr. Elsworth Soho removing 1.8L. His follow up CXR showed good drainage of the effusion. It will likely recur and can be treated with a pleurx catheter at that time but now it is a very small residual effusion so it would be best to wait for some recurrence before placing a pleurx. The patient can be sent home if he is stable otherwise and be seen in our office in 1 week with a cxr. If the effusion is increasing again he can have a pleurx placed as an outpatient. This will also prevent use of resources to transfer him to [facility] for placement of the catheter. If he needs to stay in the hospital for other reasons then I would repeat his CXR later this week. There needs to at least be a moderate effusion to safely and effectively place a pleurx catheter.

## 2013-11-01 NOTE — Progress Notes (Signed)
INITIAL NUTRITION ASSESSMENT  Patient meets the criteria for severe MALNUTRITION in the context of chronic illness with 7% weight loss in 3 months, PO intake <75% of estimated needs, and severe fat and muscle depletion  DOCUMENTATION CODES Per approved criteria  -Severe malnutrition in the context of chronic illness   INTERVENTION: -Patient's wife will continue to bring Ensure shakes from home per patient's preference. Patient advised to request Ensure Complete from unit as needed to supplement.  -Spent extensive amount of time educating patient on menu choices that will be easy to chew. RD provided patient with a menu with foods highlighted. Also, provided with a list of foods we discussed, which may be tolerated.  -Please assist patient with ordering meals to ensure he has adequate PO intake  NUTRITION DIAGNOSIS: Inadequate oral intake related to chewing difficulty and food preferences as evidenced by 25% intake of meals.   Goal: Patient will meet >/=90% of estimated nutrition needs  Monitor:  PO intake, weight, labs, I/Os  Reason for Assessment: Consult, Assessment of nutrition status and requirements  58 y.o. male  Admitting Dx: Acute on chronic respiratory failure with hypoxia  ASSESSMENT: 58 yo male h/o stage 4 lung cancer since 2009 s/p 6 cycles of chemotherapy and maintenance chemotherapy with right chest tube for chronic recurrent rt pleural effusion, htn comes in with several days of worsening sob and hypoxia at home. Ct shows worsening pleural effusions now bilaterally. S/p thoracentesis 8/22.   Patient reports that his PO intake has been fair. This is due in part to lacking back teeth and difficulty chewing. He is also a picky eater, with taste changes related to chemotherapy. He drinks about 3-5 Ensure shakes daily at home, which provide him with most of his needs. His wife (who is an Therapist, sports) has been bringing these at home as he prefers the dark chocolate flavor, which we do  not stock. They are ok with continuing to bring from home.   He does not like hospital food, but has been unable to select foods that he likes and can chew easily. His weight is down 7% in 3 months, which is significant over the time frame. He also has severe fat and muscle depletion at the upper arm, shoulder, and clavicle.   Patient with poor prognosis.   Height: Ht Readings from Last 1 Encounters:  10/30/13 5\' 10"  (1.778 m)    Weight: Wt Readings from Last 1 Encounters:  11/01/13 147 lb 0.8 oz (66.7 kg)    Ideal Body Weight: 166 pounds  % Ideal Body Weight: 89%  Wt Readings from Last 10 Encounters:  11/01/13 147 lb 0.8 oz (66.7 kg)  10/06/13 146 lb 9.6 oz (66.497 kg)  09/02/13 149 lb 12.8 oz (67.949 kg)  08/06/13 150 lb 6.4 oz (68.221 kg)  07/20/13 158 lb 3.2 oz (71.759 kg)  06/30/13 153 lb 12.8 oz (69.763 kg)  06/09/13 156 lb 8 oz (70.988 kg)  02/19/13 161 lb (73.029 kg)  12/18/12 159 lb 4.8 oz (72.258 kg)  10/13/12 160 lb 9.6 oz (72.848 kg)    Usual Body Weight: 158 pounds  % Usual Body Weight: 93%  BMI:  Body mass index is 21.1 kg/(m^2). Patient is normal weight  Estimated Nutritional Needs: Kcal: 1950-2100 kcal Protein: 85-100 g Fluid: >2.3 L/day  Skin: Right chest tube  Diet Order: General  EDUCATION NEEDS: -Education needs addressed   Intake/Output Summary (Last 24 hours) at 11/01/13 1155 Last data filed at 11/01/13 0600  Gross per 24  hour  Intake  402.5 ml  Output    402 ml  Net    0.5 ml    Last BM: 8/23   Labs:   Recent Labs Lab 10/31/13 0133 11/01/13 0346  NA 141 144  K 4.3 4.2  CL 98 100  CO2 35* 37*  BUN 14 18  CREATININE 1.06 1.27  CALCIUM 8.8 8.7  GLUCOSE 117* 103*    CBG (last 3)  No results found for this basename: GLUCAP,  in the last 72 hours  Scheduled Meds: . antiseptic oral rinse  7 mL Mouth Rinse BID  . guaiFENesin  1,200 mg Oral BID  . ipratropium-albuterol  3 mL Nebulization TID  . OxyCODONE  40 mg Oral 3  times per day  . piperacillin-tazobactam (ZOSYN)  IV  3.375 g Intravenous 3 times per day  . sertraline  100 mg Oral QHS  . sodium chloride  3 mL Intravenous Q12H  . sodium chloride  3 mL Intravenous Q12H  . vancomycin  750 mg Intravenous Q12H    Continuous Infusions:   Past Medical History  Diagnosis Date  . Hypertension   . lung ca dx'd 05/2007    chemo comp 12/2009  . 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 Hx antineoplastic chemo 2011  . Pneumonia   . Postobstructive pneumonia, healthcare associated 10/31/2013  . Protein-calorie malnutrition, severe 10/01/2013  . Recurrent right pleural effusion 06/30/2013  . Depression with anxiety 10/31/2013    History reviewed. No pertinent past surgical history.  Larey Seat, RD, LDN Pager #: [phone] After-Hours Pager #: [phone]

## 2013-11-01 NOTE — Progress Notes (Signed)
Progress Note   Zachery Niswander QMG:[id number] DOB: 06-09-1955 DOA: 10/30/2013 PCP: Woody Seller, MD   Brief Narrative:   [name] [name] is an 58 y.o. 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 CT scan, done on admission, showed a large left pleural effusion, stable right pleural collection with pleural drain in place, increased widespread nodularity of the left lung suspicious for lymphangitic carcinoma and possible postobstructive pneumonia on the right.  Assessment/Plan:   Principal Problem:   Acute on chronic respiratory failure with hypoxia, multifactorial, with progressive metastatic lung cancer, malignant pleural effusions, and postobstructive pneumonia all contributory  Admitted to the SDU and placed on 10 L of oxygen by nonrebreather mask.  Being followed by PCCM.  Status post 1.8 L thoracentesis 10/31/13.  F/U cultures, AFB, cytology.  Studies consistent with exudate.  Continue empiric vancomycin and Zosyn to cover healthcare associated postobstructive pneumonia. Followup blood cultures.  Active Problems:   Ascites  Likely malignant. May need paracentesis.    Metastatic non-small cell lung cancer  We'll notify Dr. Julien Nordmann of the patient's admission.  CODE STATUS discussed by admitting  physician, patient wishes to remain a full code.    Bilateral malignant pleural effusions   Has a Pleurx catheter on the right in place which can be drained as needed.  Status post left sided thoracentesis.    Stage III chronic kidney disease  Creatinine stable.    Hypertension  Norvasc and metoprolol currently on hold. BP remains soft.    Protein-calorie malnutrition, severe / underweight  BMI 21.  Dietitian evaluation requested.    Depression and anxiety   Continue Xanax and Zoloft.    DVT Prophylaxis  Continue SCDs.  Code Status: Full. Family Communication: No family currently at the bedside. Disposition Plan: Home when stable.   IV Access:    Peripheral IV   Procedures and diagnostic studies:    Chest x-ray 10/30/13: Progressive bilateral pulmonary opacity favored to reflect increasing pleural effusions. Right chest tube remains in place. No pneumothorax.   CT chest 10/31/13: 1. Progressed left pleural effusion, large. Stable right pleural collection with pleural drain in place. 2. Increased widespread nodularity in the left lung suspicious for lymphangitic carcinoma, but might instead indicate acute viral/atypical pneumonia. 3. Stable abnormal right lung parenchyma, probably a combination of pulmonary tumor and postobstructive infection. 4. Increased ascites, small to moderate.    Thoracentesis 10/31/13: 1800 ml of amber yellow pleural fluid was obtained. A sample was sent to Pathology for cytology and cell counts, as well as for infection analysis.   Dg Chest Port 1 view 10/31/13: 1. No evidence of pneumothorax status post left thoracentesis. 2. Significantly reduced left pleural effusion.   Medical Consultants:    Dr. Kara Mead, Pulmonology   Other Consultants:    Dietician   Anti-Infectives:    Vancomycin 10/31/13--->  Zosyn 10/31/13--->  Subjective:   [name] feels less dyspneic this morning, although he still has a high O2  requirement.  LBM was yesterday.  No N/V.  No significant pain. Appetite OK.   Objective:    Filed Vitals:   10/31/13 2200 11/01/13 0000 11/01/13 0200 11/01/13 0400  BP: 100/55 103/62 106/57 107/59  Pulse: 74 73 73 71  Temp:  98.2 F (36.8 C)  98 F (36.7 C)  TempSrc:  Oral  Oral  Resp: 16 8 16 17   Height:      Weight:    66.7 kg (147 lb 0.8 oz)  SpO2: 94% 94% 95% 93%    Intake/Output Summary (Last 24 hours) at 11/01/13 0715 Last data filed at 11/01/13 0400  Gross per 24 hour  Intake    250 ml  Output    402 ml  Net   -152 ml    Exam: Gen:  NAD, on NRM Cardiovascular:  RRR, No M/R/G Respiratory:  Lungs diminished in bases bilaterally Gastrointestinal:  Abdomen soft, NT/ND, + BS Extremities:  No C/E/C, SCDs on   Data Reviewed:    Labs: Basic Metabolic Panel:  Recent Labs Lab 10/31/13 0133 11/01/13 0346  NA 141 144  K 4.3 4.2  CL 98 100  CO2 35* 37*  GLUCOSE 117* 103*  BUN 14 18  CREATININE 1.06 1.27  CALCIUM 8.8 8.7   GFR Estimated Creatinine Clearance: 59.1 ml/min (by C-G formula based on Cr of 1.27). Liver Function Tests:  Recent Labs Lab 10/31/13 0133 10/31/13 1144  AST 20  --   ALT 11  --   ALKPHOS 80  --   BILITOT 0.2*  --   PROT 5.4* 4.8*  ALBUMIN 2.0*  --    Coagulation profile  Recent Labs Lab 10/31/13 0346  INR 1.05    CBC:  Recent Labs Lab 10/31/13 0133 11/01/13 0346  WBC 12.9* 12.6*  NEUTROABS 11.1*  --   HGB 12.8* 12.3*  HCT 40.8 39.6  MCV 85.5 85.3  PLT 424* 414*   Cardiac Enzymes:  Recent Labs Lab 10/31/13 0133  TROPONINI <0.30   BNP (last 3 results)  Recent Labs  09/24/13 1637  PROBNP 221.7*   Microbiology Recent Results (from the past 240 hour(s))  CULTURE, BLOOD (ROUTINE X 2)     Status: None   Collection Time    10/31/13  1:33 AM      Result Value Ref Range Status   Specimen Description Blood RIGHT ANTECUBITAL   Final   Special Requests BOTTLES DRAWN AEROBIC AND ANAEROBIC 6CC   Final    Culture PENDING   Incomplete   Report Status PENDING   Incomplete  CULTURE, BLOOD (ROUTINE X 2)     Status: None   Collection Time    10/31/13  1:45 AM      Result Value Ref Range Status   Specimen Description Blood BLOOD LEFT HAND   Final   Special Requests BOTTLES DRAWN AEROBIC AND ANAEROBIC [place]   Final   Culture PENDING   Incomplete   Report Status PENDING   Incomplete  MRSA PCR SCREENING     Status: None   Collection Time    10/31/13  6:27 AM      Result Value Ref Range Status   MRSA by PCR NEGATIVE  NEGATIVE Final   Comment:            The GeneXpert MRSA Assay (FDA     approved for NASAL specimens     only), is one component of a     comprehensive MRSA colonization     surveillance program.  It is not     intended to diagnose MRSA     infection nor to guide or     monitor treatment for     MRSA infections.     Medications:   . antiseptic oral rinse  7 mL Mouth Rinse BID  . guaiFENesin  1,200 mg Oral BID  . ipratropium-albuterol  3 mL Nebulization TID  . OxyCODONE  40 mg Oral 3 times per day  . piperacillin-tazobactam (ZOSYN)  IV  3.375 g Intravenous 3 times per day  . pneumococcal 23 valent vaccine  0.5 mL Intramuscular Tomorrow-1000  . sertraline  100 mg Oral QHS  . sodium chloride  3 mL Intravenous Q12H  . sodium chloride  3 mL Intravenous Q12H  . vancomycin  750 mg Intravenous Q12H   Continuous Infusions:   Time spent: 35 minutes, the patient is critically ill and requires high complexity decision making, coordination of care.   LOS: 2 days   RAMA,CHRISTINA  [facility] Pager [phone]. If unable to reach me by pager, please call my cell phone at [phone].  *Please refer to [email or web address], password [email or web address] to get updated schedule on who will round on this patient, as hospitalists switch teams weekly. If 7PM-7AM, please contact night-coverage at [email or web address], password [email or web address] for any overnight needs.  11/01/2013, 7:15 AM

## 2013-11-01 NOTE — Progress Notes (Addendum)
Name: [name] [name] MRN: [id number] DOB: [birth year]    ADMISSION DATE:  10/30/2013 CONSULTATION DATE:  11/01/2013  REFERRING MD :  Rama PRIMARY SERVICE:  [facility]  CHIEF COMPLAINT:  Shortness of breath, hypoxia   SIGNIFICANT EVENTS / STUDIES:  09/24/13 admitted w/ fever and PNA  Left thoracentesis 7/22: + malignant cells 1.4 litres  7/25-TPA through R chest tube by IR  CT chest 7/24>>>rt hydro pnx, decreased effusion.decreased left effusion Thoracentesis 8/22- 2 L of fluid removed from the left chest.   HISTORY OF PRESENT ILLNESS:  58 y.o. male with history of lung cancer diagnosed 2009, s/p chemotherapy, right pleural effusion status post pleurX, hypertension, former smoker, stage III chronic kidney disease, presented to the ED w/ fever, chills and pleuritic type back pain and worsening cough, productive of yellow sputum. He was seen at outpatient oncology 7/15 with CT chest, abdomen and pelvis which showed new versus worsening pulmonary metastatic disease, decreased right pleural effusion, decreased right-sided collapse with development of right greater than left patchy airspace disease and septal thickening suspicious for lymphangitic tumor spread versus infection versus drug toxicity and small to moderate left pleural effusion. CT abdomen showed small volume abdominal pelvic ascites and extensive omental thickening consistent with peritoneal/omental metastasis. He was admitted and treated for pneumonia. He underwent left thoracentesis on 7/22 which showed marked + malignant effusion He had disease progression on Avastin and Alimta, and was placed on Tarceva.  He was admitted to any [facility] with worsening respiratory distress and hypoxia, CT again showed disease progression a large left effusion that had reaccumulated. He is now requiring a nonrebreather. He remained surprisingly up beat, denies sputum production or fevers  SUBJECTIVE: denies cp Breathing better' afebrile Down to venti  mask  VITAL SIGNS: Temp:  [98 F (36.7 C)-98.6 F (37 C)] 98.4 F (36.9 C) (08/23 0857) Pulse Rate:  [71-87] 87 (08/23 1100) Resp:  [8-26] 24 (08/23 1100) BP: (83-121)/(49-78) 108/60 mmHg (08/23 1000) SpO2:  [89 %-100 %] 92 % (08/23 1100) FiO2 (%):  [40 %] 40 % (08/23 0735) Weight:  [66.7 kg (147 lb 0.8 oz)] 66.7 kg (147 lb 0.8 oz) (08/23 0400)  PHYSICAL EXAMINATION: Gen. Pleasant, well-nourished, in no distress, normal affect ENT - no lesions, no post nasal drip Neck: No JVD, no thyromegaly, no carotid bruits Lungs: no use of accessory muscles, no dullness to percussion, decreased on left without rales or rhonchi  Cardiovascular: Rhythm regular, heart sounds  normal, no murmurs, no peripheral edema Abdomen: soft and non-tender, no hepatosplenomegaly, BS normal. Musculoskeletal: No deformities, no cyanosis or clubbing Neuro:  alert, non focal Skin:  Warm, no lesions/ rash    Recent Labs Lab 10/31/13 0133 11/01/13 0346  NA 141 144  K 4.3 4.2  CL 98 100  CO2 35* 37*  BUN 14 18  CREATININE 1.06 1.27  GLUCOSE 117* 103*    Recent Labs Lab 10/31/13 0133 11/01/13 0346  HGB 12.8* 12.3*  HCT 40.8 39.6  WBC 12.9* 12.6*  PLT 424* 414*   Ct Chest Wo Contrast  10/31/2013   CLINICAL DATA:  58 year old male with shortness of breath, weakness, chest pain. Hypoxia. Initial encounter. Lung cancer.  EXAM: CT CHEST WITHOUT CONTRAST  TECHNIQUE: Multidetector CT imaging of the chest was performed following the standard protocol without IV contrast.  COMPARISON:  Portable chest radiograph 10/30/2013 and earlier.  FINDINGS: Left pleural effusion has progressed and is large. Increased left lung compressive atelectasis. Superimposed increased widespread nodular (tree-in-bud) as well as patchy  peribronchovascular opacity.  Trachea remains patent. Loss of the right lower lobe airway as before. Stable confluent peribronchovascular opacity in the aerated right lung. Chronic right pleural  collection with pleural enhancement. Right side pleural drain remains in place. Size of this collection is stable. Small volume gas within as before.  No pericardial effusion. Stable mediastinal soft tissues. Right chest porta cath.  Increased upper abdominal ascites, small to moderate in volume. Otherwise stable visualized upper abdominal viscera.  Stable visualized osseous structures.  IMPRESSION: 1. Progressed left pleural effusion, large. Stable right pleural collection with pleural drain in place. 2. Increased widespread nodularity in the left lung suspicious for lymphangitic carcinoma, but might instead indicate acute viral/atypical pneumonia. 3. Stable abnormal right lung parenchyma, probably a combination of pulmonary tumor and postobstructive infection. 4. Increased ascites, small to moderate.   Electronically Signed   By: Lars Pinks M.D.   On: 10/31/2013 03:00   Dg Chest Port 1 View  10/31/2013   CLINICAL DATA:  Status post thoracentesis  EXAM: PORTABLE CHEST - 1 VIEW  COMPARISON:  Prior CT scan of the chest 10/31/2013; prior chest x-ray 10/30/2013  FINDINGS: Interval reduction in volume of the left pleural effusion. There is still a mild-to-moderate residual layering effusion. No evidence of pneumothorax. A tunneled right pleural drain is present. Right IJ approach single-lumen port catheter remains in good position. Chronic atelectasis and parenchymal disease throughout the right lung similar compared to prior. There is been improved expansion of the atelectatic left lower lobe. Similar appearance of central bronchitic changes and interstitial prominence. Cardiac and mediastinal contours remain unchanged.  IMPRESSION: 1. No evidence of pneumothorax status post left thoracentesis. 2. Significantly reduced left pleural effusion.   Electronically Signed   By: Jacqulynn Cadet M.D.   On: 10/31/2013 11:47   Dg Chest Portable 1 View  10/31/2013   CLINICAL DATA:  58 year old male with shortness of breath.  Initial encounter. Lung cancer.  EXAM: PORTABLE CHEST - 1 VIEW  COMPARISON:  10/05/2013 and earlier.  FINDINGS: Portable AP upright view at 2342 hrs. Progressive opacification of the bilateral mid and lower lungs. Obscuration of the diaphragm. Right pleural drain or chest tube in place. No pneumothorax identified. Grossly stable mediastinal contours. Aerated left upper lobe appears stable. Visualized tracheal air column is within normal limits.  IMPRESSION: Progressive bilateral pulmonary opacity favored to reflect increasing pleural effusions. Right chest tube remains in place. No pneumothorax.   Electronically Signed   By: Lars Pinks M.D.   On: 10/31/2013 00:40    ASSESSMENT / PLAN:  Advanced NSCLCA w/ radiographic evidence of progression  Malignant right pleural effusion s/p pleurX  Malignant left pleural effusion- reaccumulated within 3 wks S/p Thoracentesis 8/22- 2 L of fluid removed from the left chest. Hypoxic respiratory failure  Recommend -  - dial down his FIO2, can try Carter, satn 88% ok -Unfortunately he had disease progression with tarceva The only thing we could possibly offer him as a left Pleurx catheter.  I would advise hospice care pending oncology input. He remains optimistic given his survival since 2009 but he understands that we have exhausted treatment options. I strongly advised him to consider DNR status - he will discuss more with his wife who is a Marine scientist. I have called TCTs to plan for a pleurX catheter on left prior to discharge -Can continue draining his right side but suspect this is scarred up  Kara Mead MD. FCCP. [facility] Pager [phone] If no  response call [phone]    11/01/2013, 11:28 AM

## 2013-11-02 DIAGNOSIS — R0902 Hypoxemia: Secondary | ICD-10-CM

## 2013-11-02 DIAGNOSIS — J9600 Acute respiratory failure, unspecified whether with hypoxia or hypercapnia: Secondary | ICD-10-CM

## 2013-11-02 DIAGNOSIS — F341 Dysthymic disorder: Secondary | ICD-10-CM

## 2013-11-02 DIAGNOSIS — E46 Unspecified protein-calorie malnutrition: Secondary | ICD-10-CM

## 2013-11-02 DIAGNOSIS — J90 Pleural effusion, not elsewhere classified: Secondary | ICD-10-CM

## 2013-11-02 DIAGNOSIS — C3490 Malignant neoplasm of unspecified part of unspecified bronchus or lung: Principal | ICD-10-CM

## 2013-11-02 DIAGNOSIS — I10 Essential (primary) hypertension: Secondary | ICD-10-CM

## 2013-11-02 LAB — BASIC METABOLIC PANEL WITH GFR
Anion gap: 8 (ref 5–15)
BUN: 14 mg/dL (ref 6–23)
CO2: 34 meq/L — ABNORMAL HIGH (ref 19–32)
Calcium: 8.5 mg/dL (ref 8.4–10.5)
Chloride: 99 meq/L (ref 96–112)
Creatinine, Ser: 1.23 mg/dL (ref 0.50–1.35)
GFR calc Af Amer: 73 mL/min — ABNORMAL LOW
GFR calc non Af Amer: 63 mL/min — ABNORMAL LOW
Glucose, Bld: 101 mg/dL — ABNORMAL HIGH (ref 70–99)
Potassium: 3.9 meq/L (ref 3.7–5.3)
Sodium: 141 meq/L (ref 137–147)

## 2013-11-02 LAB — VANCOMYCIN, TROUGH: Vancomycin Tr: 20 ug/mL (ref 10.0–20.0)

## 2013-11-02 NOTE — Progress Notes (Signed)
PRINCIPAL DIAGNOSIS AND STAGE:  1. Metastatic non-small cell lung cancer, adenocarcinoma diagnosed in June of 2009. 2. Renal insufficiency followed by Dr. Posey Pronto. 3. History of seizure followed by Dr. Doy Mince. PRIOR THERAPY:  1. Status post 6 cycles of systemic chemotherapy with carboplatin, Alimta, and Avastin. Last dose was given December 25, 2007, and the patient had partial response to this treatment.  2. Status post maintenance chemotherapy with Alimta 500 mg per meter squared and Avastin 15 mg/kg given every 3 weeks, status post 23 cycles. Last dose was given May 23, 2009. The patient had stable disease but the treatment was discontinued secondary to toxicity and renal insufficiency. 3. Right Pleurx catheter placement by interventional radiology on 07/03/2013  CURRENT THERAPY: Tarceva 150 mg by mouth daily, started 07/04/2013.   CHEMOTHERAPY INTENT: Palliative  CURRENT # OF CHEMOTHERAPY CYCLES: 3  CURRENT ANTIEMETICS: N/A  CURRENT SMOKING STATUS: Occasional smoker, strongley advised to quit smoking.  ORAL CHEMOTHERAPY AND CONSENT: None  CURRENT BISPHOSPHONATES USE: None  PAIN MANAGEMENT:1/10  NARCOTICS INDUCED CONSTIPATION: N/A  LIVING WILL AND CODE STATUS: Full code.   Subjective: The patient is seen and examined. He was readmitted with shortness of breath and recent imaging of the chest showed reaccumulation of left pleural effusion. He underwent left sided thoracentesis under the care of Dr.Alva. The patient was seen by Dr. Cyndia Bent and he would consider him for PleurX Catheter placement if he has reaccumulation of the left pleural fluid. The patient is feeling a little bit better today. He missed a few appointments for followup at the [facility]. He denied having any significant fever or chills. He has been off treatment with Tarceva for at least 6 weeks.  Objective: Vital signs in last 24 hours: Temp:  [97.8 F (36.6 C)-98.9 F (37.2 C)] 98.5 F (36.9 C) (08/24 0400) Pulse  Rate:  [72-94] 86 (08/24 0800) Resp:  [9-24] 17 (08/24 0800) BP: (95-148)/(39-83) 95/39 mmHg (08/24 0800) SpO2:  [90 %-100 %] 90 % (08/24 0800) Weight:  [148 lb 9.4 oz (67.4 kg)] 148 lb 9.4 oz (67.4 kg) (08/24 0100)  Intake/Output from previous day: 08/23 0701 - 08/24 0700 In: 71 [P.O.:120; I.V.:240; IV Piggyback:450] Out: 150 [Urine:150] Intake/Output this shift: Total I/O In: 10 [I.V.:10] Out: -   General appearance: alert, cooperative, fatigued and mild distress Resp: diminished breath sounds bilaterally and dullness to percussion bilaterally Cardio: regular rate and rhythm, S1, S2 normal, no murmur, click, rub or gallop GI: soft, non-tender; bowel sounds normal; no masses,  no organomegaly Extremities: extremities normal, atraumatic, no cyanosis or edema  Lab Results:   Recent Labs  10/31/13 0133 11/01/13 0346  WBC 12.9* 12.6*  HGB 12.8* 12.3*  HCT 40.8 39.6  PLT 424* 414*   BMET  Recent Labs  11/01/13 0346 11/02/13 0515  NA 144 141  K 4.2 3.9  CL 100 99  CO2 37* 34*  GLUCOSE 103* 101*  BUN 18 14  CREATININE 1.27 1.23  CALCIUM 8.7 8.5    Studies/Results: Dg Chest Port 1 View  10/31/2013   CLINICAL DATA:  Status post thoracentesis  EXAM: PORTABLE CHEST - 1 VIEW  COMPARISON:  Prior CT scan of the chest 10/31/2013; prior chest x-ray 10/30/2013  FINDINGS: Interval reduction in volume of the left pleural effusion. There is still a mild-to-moderate residual layering effusion. No evidence of pneumothorax. A tunneled right pleural drain is present. Right IJ approach single-lumen port catheter remains in good position. Chronic atelectasis and parenchymal disease throughout the right lung  similar compared to prior. There is been improved expansion of the atelectatic left lower lobe. Similar appearance of central bronchitic changes and interstitial prominence. Cardiac and mediastinal contours remain unchanged.  IMPRESSION: 1. No evidence of pneumothorax status post left  thoracentesis. 2. Significantly reduced left pleural effusion.   Electronically Signed   By: Jacqulynn Cadet M.D.   On: 10/31/2013 11:47    Medications: I have reviewed the patient's current medications.  Assessment/Plan: 1) metastatic non-small cell lung cancer, adenocarcinoma status post several chemotherapy regimens and most recently treated with single agent Tarceva but unfortunately the patient has evidence for disease progression. I would consider him for treatment with immunotherapy but I also will discuss with the patient palliative care and hospice referral. 2) bilateral pleural effusions with reaccumulation of the left pleural fluid: The patient would benefit from Pleurx catheter placement once he has moderate to large pleural fluid. He was seen by Dr. Cyndia Bent. 3) shortness of breath: Secondary to the above diagnosis. 4) protein calorie malnutrition: The patient will need nutritional evaluation. Thank you for taking good care of [name] [name], I will continue to follow up the patient with you and assist in his management an as-needed basis.  LOS: 3 days    Antonea Gaut K. 11/02/2013

## 2013-11-02 NOTE — Progress Notes (Signed)
Patient ID: [name] [name], male   DOB: 1955-08-14, 58 y.o.   MRN: [id number] [facility] PROGRESS NOTE  [name] [name] WUJ:[id number] DOB: Aug 01, 1955 DOA: 10/30/2013 PCP: Woody Seller, MD  Brief narrative: 58 y.o. 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 CT scan, done on admission, showed a large left pleural effusion, stable right pleural collection with pleural drain in place, increased widespread nodularity of the left lung suspicious for lymphangitic carcinoma and possible postobstructive pneumonia on the right.   Assessment/Plan:    Principal Problem:  Acute on chronic respiratory failure with hypoxia, multifactorial, with progressive metastatic lung cancer, malignant pleural effusions, and postobstructive pneumonia all contributory / Leukocytosis Admitted to the SDU and placed on 10 L of oxygen by nonrebreather mask. Oxygen saturation 90% this am. I appreciate very much PCCM recommendations. Per PCCM wean O2 for sats greater than 88%. If pt continues to have dyspnea we may offer him a left pleurx catheter for symptomatic relief. Most recently, left side US guided thoracentesis done with 1.8 L fluid removed. Repeat CXR in am to reassess fluid accumulation. Continue empiric antibiotics, vancomycin and zosyn  to cover for healthcare associated postobstructive pneumonia.  Blood culutres to date show no growth. Pleural fluid culture shows no growth. Fungal stain negative for yeast. AFB culture pending.  Active Problems:  Ascites  Likely malignant. Abdomen non tender, no significant distention. Will continue to monitor if pt may require paracentesis.  Metastatic non-small cell lung cancer  Pt has been seen by Dr. Julien Nordmann of oncology. Pt is currently full code status.  Bilateral malignant pleural effusions  Has a Pleurx catheter on the right in place which can be drained as needed.  Status post left sided thoracentesis 10/31/2013.  Stage III chronic kidney disease  Creatinine stable, WNL. Hypertension  Norvasc and metoprolol currently on hold due to hypotension.  Protein-calorie malnutrition, severe / underweight  In the context of chronic illness, malignancy.  BMI 21.  Dietitian evaluation requested. Depression and anxiety  Continue Xanax and Zoloft. DVT Prophylaxis  Continue SCDs while pt is in hospital.   Code Status: Full.  Family Communication: Family not at the bedside this am. Disposition Plan: Home when stable. Remains in SDU due to hypotension.    IV Access:   Peripheral IV Procedures and diagnostic studies:   Chest x-ray 10/30/13: Progressive bilateral pulmonary opacity favored to reflect increasing pleural effusions. Right chest tube remains in place. No pneumothorax. CT chest 10/31/13: 1. Progressed left pleural effusion, large. Stable right pleural collection with pleural drain in place. 2. Increased widespread nodularity in the left lung suspicious for lymphangitic carcinoma, but might instead indicate acute viral/atypical pneumonia. 3. Stable abnormal right lung parenchyma, probably a combination of pulmonary tumor and postobstructive infection. 4. Increased ascites, small to moderate.  Thoracentesis 10/31/13: 1800 ml of amber yellow pleural fluid was obtained. A sample was sent  to Pathology for cytology and cell counts, as well as for infection analysis. Dg Chest Port 1 view 10/31/13:  1. No evidence of pneumothorax status post left thoracentesis. 2. Significantly reduced left pleural effusion. Medical Consultants:   Dr. Kara Mead, Pulmonology Other Consultants:   Dietician Anti-Infectives:   Vancomycin 10/31/13--->  Zosyn 10/31/13--->   [name] Lenz, MD  [facility] Pager [phone]  If 7PM-7AM, please contact night-coverage [email or web address] Password [facility] 11/02/2013, 12:52 PM   LOS: 3 days    HPI/Subjective: No acute overnight events.  Objective: Filed Vitals:   11/02/13 0600 11/02/13 0800 11/02/13 1000 11/02/13 1200  BP: 117/63 95/39 98/60    Pulse: 80 86 96   Temp:  98.2 F (36.8 C)  98.1 F (36.7 C)  TempSrc:  Oral  Axillary  Resp: 19 17 15    Height:      Weight:      SpO2: 93% 90% 95%     Intake/Output Summary (Last 24 hours) at 11/02/13 1252 Last data filed at 11/02/13 1000  Gross per 24 hour  Intake    790 ml  Output    150 ml  Net    640 ml    Exam:   General:  Pt is alert, follows commands appropriately, not in acute distress  Cardiovascular: Regular rate and rhythm, S1/S2, no murmurs  Respiratory: breath sounds diminished on left but no wheezing  Abdomen: Soft, non tender, non distended, bowel sounds present  Extremities: No edema, pulses DP and PT palpable bilaterally  Neuro: Grossly nonfocal  Data Reviewed: Basic Metabolic Panel:  Recent Labs Lab 10/31/13 0133 11/01/13 0346 11/02/13 0515  NA 141 144 141  K 4.3 4.2 3.9  CL 98 100 99  CO2 35* 37* 34*  GLUCOSE 117* 103* 101*  BUN 14 18 14   CREATININE 1.06 1.27 1.23  CALCIUM 8.8 8.7 8.5   Liver Function Tests:  Recent Labs Lab 10/31/13 0133 10/31/13 1144  AST 20  --   ALT 11  --   ALKPHOS 80  --   BILITOT 0.2*  --   PROT 5.4* 4.8*  ALBUMIN 2.0*  --    No results found for this basename: LIPASE, AMYLASE,  in the last 168 hours No results  found for this basename: AMMONIA,  in the last 168 hours CBC:  Recent Labs Lab 10/31/13 0133 11/01/13 0346  WBC 12.9* 12.6*  NEUTROABS 11.1*  --   HGB 12.8* 12.3*  HCT 40.8 39.6  MCV 85.5 85.3  PLT 424* 414*   Cardiac Enzymes:  Recent Labs Lab 10/31/13 0133  TROPONINI <0.30   BNP: No components found with this basename: POCBNP,  CBG: No results found for this basename: GLUCAP,  in the last 168 hours  CULTURE, BLOOD (ROUTINE X 2)     Status: None   Collection Time    10/31/13  1:33 AM      Result Value Ref Range Status   Specimen Description Blood RIGHT ANTECUBITAL   Final   Culture NO GROWTH 2 DAYS   Final   Report Status PENDING   Incomplete  CULTURE, BLOOD (ROUTINE X 2)     Status: None   Collection Time    10/31/13  1:45 AM      Result Value Ref Range Status   Specimen Description Blood BLOOD LEFT HAND   Final   Culture NO GROWTH 2 DAYS   Final   Report Status PENDING   Incomplete  MRSA PCR SCREENING     Status: None   Collection Time    10/31/13  6:27 AM      Result Value  Ref Range Status   MRSA by PCR NEGATIVE  NEGATIVE Final  AFB CULTURE WITH SMEAR     Status: None   Collection Time    10/31/13 11:00 AM      Result Value Ref Range Status   Specimen Description PLEURAL   Final   Value: CULTURE WILL BE EXAMINED FOR 6 WEEKS BEFORE ISSUING A FINAL REPORT     Performed at [facility]   Report Status PENDING   Incomplete  BODY FLUID CULTURE     Status: None   Collection Time    10/31/13 11:00 AM      Result Value Ref Range Status   Specimen Description PLEURAL   Final   Value: NO GROWTH 2 DAYS     Performed at [facility]   Report Status PENDING   Incomplete  FUNGAL STAIN     Status: None   Collection Time    10/31/13 11:00 AM      Result Value Ref Range Status   Specimen Description PLEURAL   Final   Special Requests NONE   Final   Fungal Smear     Final   Value: NO YEAST OR FUNGAL ELEMENTS SEEN     Performed at [facility]   Report Status 11/01/2013 FINAL   Final     Scheduled Meds: . guaiFENesin  1,200 mg Oral BID  . ipratropium-albuterol  3 mL Nebulization TID  . OxyCODONE  40 mg Oral 3 times per day  . piperacillin-tazobactam   3.375 g Intravenous 3 times per day  . sertraline  100 mg Oral QHS  . vancomycin  750 mg Intravenous Q12H

## 2013-11-02 NOTE — Progress Notes (Signed)
ANTIBIOTIC CONSULT NOTE - FOLLOW UP  Pharmacy Consult for Vancomycin Indication: pneumonia  No Known Allergies  Patient Measurements: Height: 5\' 10"  (177.8 cm) Weight: 148 lb 9.4 oz (67.4 kg) IBW/kg (Calculated) : 73 Adjusted Body Weight:   Vital Signs: Temp: 97.8 F (36.6 C) (08/24 0000) Temp src: Oral (08/24 0000) BP: 117/63 mmHg (08/24 0600) Pulse Rate: 80 (08/24 0600) Intake/Output from previous day: 08/23 0701 - 08/24 0700 In: 800 [P.O.:120; I.V.:230; IV Piggyback:450] Out: 150 [Urine:150] Intake/Output from this shift: Total I/O In: 630 [P.O.:120; I.V.:110; IV Piggyback:400] Out: -   Labs:  Recent Labs  10/31/13 0133 11/01/13 0346 11/02/13 0515  WBC 12.9* 12.6*  --   HGB 12.8* 12.3*  --   PLT 424* 414*  --   CREATININE 1.06 1.27 1.23   Estimated Creatinine Clearance: 61.6 ml/min (by C-G formula based on Cr of 1.23).  Recent Labs  11/02/13 0515  VANCOTROUGH 20.0     Microbiology: Recent Results (from the past 720 hour(s))  CULTURE, BLOOD (ROUTINE X 2)     Status: None   Collection Time    10/31/13  1:33 AM      Result Value Ref Range Status   Specimen Description Blood RIGHT ANTECUBITAL   Final   Special Requests BOTTLES DRAWN AEROBIC AND ANAEROBIC 6CC   Final   Culture NO GROWTH 1 DAY   Final   Report Status PENDING   Incomplete  CULTURE, BLOOD (ROUTINE X 2)     Status: None   Collection Time    10/31/13  1:45 AM      Result Value Ref Range Status   Specimen Description Blood BLOOD LEFT HAND   Final   Special Requests BOTTLES DRAWN AEROBIC AND ANAEROBIC [place]   Final   Culture NO GROWTH 1 DAY   Final   Report Status PENDING   Incomplete  MRSA PCR SCREENING     Status: None   Collection Time    10/31/13  6:27 AM      Result Value Ref Range Status   MRSA by PCR NEGATIVE  NEGATIVE Final   Comment:            The GeneXpert MRSA Assay (FDA     approved for NASAL specimens     only), is one component of a     comprehensive MRSA colonization    surveillance program. It is not     intended to diagnose MRSA     infection nor to guide or     monitor treatment for     MRSA infections.  AFB CULTURE WITH SMEAR     Status: None   Collection Time    10/31/13 11:00 AM      Result Value Ref Range Status   Specimen Description PLEURAL   Final   Special Requests NONE   Final   Acid Fast Smear     Final   Value: NO ACID FAST BACILLI SEEN     Performed at [facility]   Culture     Final   Value: CULTURE WILL BE EXAMINED FOR 6 WEEKS BEFORE ISSUING A FINAL REPORT     Performed at [facility]   Report Status PENDING   Incomplete  BODY FLUID CULTURE     Status: None   Collection Time    10/31/13 11:00 AM      Result Value Ref Range Status   Specimen Description PLEURAL   Final   Special Requests NONE  Final   Gram Stain     Final   Value: MODERATE WBC PRESENT, PREDOMINANTLY MONONUCLEAR     NO ORGANISMS SEEN     Performed at [facility]   Culture     Final   Value: NO GROWTH 1 DAY     Performed at [facility]   Report Status PENDING   Incomplete  FUNGAL STAIN     Status: None   Collection Time    10/31/13 11:00 AM      Result Value Ref Range Status   Specimen Description PLEURAL   Final   Special Requests NONE   Final   Fungal Smear     Final   Value: NO YEAST OR FUNGAL ELEMENTS SEEN     Performed at [facility]   Report Status 11/01/2013 FINAL   Final    Anti-infectives   Start     Dose/Rate Route Frequency Ordered Stop   10/31/13 1800  vancomycin (VANCOCIN) IVPB 750 mg/150 ml premix     750 mg 150 mL/hr over 60 Minutes Intravenous Every 12 hours 10/31/13 0649     10/31/13 1000  piperacillin-tazobactam (ZOSYN) IVPB 3.375 g     3.375 g 12.5 mL/hr over 240 Minutes Intravenous 3 times per day 10/31/13 0649     10/31/13 0330  vancomycin (VANCOCIN) IVPB 1000 mg/200 mL premix     1,000 mg 200 mL/hr over 60 Minutes Intravenous  Once 10/31/13 0321 10/31/13 0445   10/31/13 0330   piperacillin-tazobactam (ZOSYN) IVPB 3.375 g     3.375 g 100 mL/hr over 30 Minutes Intravenous  Once 10/31/13 0321 10/31/13 0445      Assessment: Patient with vancomycin trough at goal but highest value of goal.  Will delay next dose by four hours.  Goal of Therapy:  Vancomycin trough level 15-20 mcg/ml  Plan:  Measure antibiotic drug levels at steady state Follow up culture results Continue with current dosing but delay next dose by 4hours to help keep within goal.  Tyler Deis, Shea Stakes [place] 11/02/2013,6:19 AM

## 2013-11-02 NOTE — Progress Notes (Signed)
Name: [name] [name] MRN: [id number] DOB: 1955/07/23    ADMISSION DATE:  10/30/2013 CONSULTATION DATE:  11/02/2013  REFERRING MD :  Rama PRIMARY SERVICE:  [facility]  CHIEF COMPLAINT:  Shortness of breath, hypoxia   SIGNIFICANT EVENTS / STUDIES:  7/16  Admitted w/ fever and PNA  7/22  Left thoracentesis >> + malignant cells 1.4 litres  7/25 TPA through R chest tube by IR  7/24  CT chest >> rt hydro pnx, decreased effusion.decreased left effusion 8/22  Thoracentesis >> 2 L of fluid removed from the left chest.   BRIEF SUMMARY:  58 y.o. male with history of lung cancer diagnosed 2009, s/p chemotherapy, right pleural effusion status post pleurX, hypertension, former smoker, stage III chronic kidney disease, presented to the ED 8/21 w/ fever, chills and pleuritic type back pain and worsening cough, productive of yellow sputum. He was seen at outpatient oncology 7/15 with CT chest, abdomen and pelvis which showed new versus worsening pulmonary metastatic disease, decreased right pleural effusion, decreased right-sided collapse with development of right greater than left patchy airspace disease and septal thickening suspicious for lymphangitic tumor spread versus infection versus drug toxicity and small to moderate left pleural effusion. CT abdomen showed small volume abdominal pelvic ascites and extensive omental thickening consistent with peritoneal/omental metastasis. He was admitted and treated for pneumonia. He underwent left thoracentesis on 7/22 which showed marked + malignant effusion.  He had disease progression on Avastin and Alimta, and was placed on Tarceva.  He was admitted to [facility] with worsening respiratory distress and hypoxia, CT again showed disease progression a large left effusion that had reaccumulated.  He underwent repeat thora on 8/22 with 1.8 L removed.  Evaluated by TCTS and will have to wait for recurrence of pleural fluid to place Pleurx.     SUBJECTIVE:  Pt denies SOB,  acute pain.    VITAL SIGNS: Temp:  [97.8 F (36.6 C)-98.9 F (37.2 C)] 98.2 F (36.8 C) (08/24 0800) Pulse Rate:  [72-94] 86 (08/24 0800) Resp:  [9-24] 17 (08/24 0800) BP: (95-148)/(39-83) 95/39 mmHg (08/24 0800) SpO2:  [90 %-100 %] 90 % (08/24 0800) Weight:  [148 lb 9.4 oz (67.4 kg)] 148 lb 9.4 oz (67.4 kg) (08/24 0100)  PHYSICAL EXAMINATION: Gen. Pleasant, well-nourished, in no distress, normal affect ENT - no lesions, no post nasal drip Neck: No JVD, no thyromegaly, no carotid bruits Lungs: no use of accessory muscles, no dullness to percussion, decreased on left without rales or rhonchi  Cardiovascular: Rhythm regular, heart sounds  normal, no murmurs, no peripheral edema Abdomen: soft and non-tender, no hepatosplenomegaly, BS normal. Musculoskeletal: No deformities, no cyanosis or clubbing Neuro:  alert, non focal Skin:  Warm, no lesions/ rash    Recent Labs Lab 10/31/13 0133 11/01/13 0346 11/02/13 0515  NA 141 144 141  K 4.3 4.2 3.9  CL 98 100 99  CO2 35* 37* 34*  BUN 14 18 14   CREATININE 1.06 1.27 1.23  GLUCOSE 117* 103* 101*    Recent Labs Lab 10/31/13 0133 11/01/13 0346  HGB 12.8* 12.3*  HCT 40.8 39.6  WBC 12.9* 12.6*  PLT 424* 414*   Dg Chest Port 1 View  10/31/2013   CLINICAL DATA:  Status post thoracentesis  EXAM: PORTABLE CHEST - 1 VIEW  COMPARISON:  Prior CT scan of the chest 10/31/2013; prior chest x-ray 10/30/2013  FINDINGS: Interval reduction in volume of the left pleural effusion. There is still a mild-to-moderate residual layering effusion. No evidence of  pneumothorax. A tunneled right pleural drain is present. Right IJ approach single-lumen port catheter remains in good position. Chronic atelectasis and parenchymal disease throughout the right lung similar compared to prior. There is been improved expansion of the atelectatic left lower lobe. Similar appearance of central bronchitic changes and interstitial prominence. Cardiac and mediastinal  contours remain unchanged.  IMPRESSION: 1. No evidence of pneumothorax status post left thoracentesis. 2. Significantly reduced left pleural effusion.   Electronically Signed   By: Jacqulynn Cadet M.D.   On: 10/31/2013 11:47    ASSESSMENT / PLAN:  Advanced NSCLCA - w/ radiographic evidence of progression  Malignant right pleural effusion - s/p pleurX placement Malignant left pleural effusion - reaccumulated within 3 wks post thora.  S/p repeat thoracentesis 8/22 with 2 L of fluid removed from the left chest. Hypoxic Respiratory Failure  Plan: -Wean O2 for sats > 88% -Unfortunately he had disease progression with tarceva.  The only thing we could possibly offer him as a left Pleurx catheter for dyspnea relief. -Advise Palliative care pending oncology input.  Discussed meds for dyspnea relief (pt is open to idea, currently does not need) -He remains optimistic given his survival since 2009 but he understands that we have exhausted treatment options.  Strongly advised him to consider DNR status  -TCTS following for pleurx cath placement.  Can be done on an outpatient basis if fluid slow to re-accumilate.    -Reassess CXR in am to assess fluid status if still here. -Can continue draining his right side but suspect this is scarred up   PCCM will be available PRN.  Please call if needs arise.   Noe Gens, NP-C [facility] Pgr: [phone] or [phone]    11/02/2013, 9:03 AM  Reviewed above, examined.  Clinically improved after recent thoracentesis.  If his symptoms recur and pleural effusion re-accumulates, then recommend arranging for pleurx catheter for palliative purposes.  PCCM will sign off.  Please call if additional help is needed.  Chesley Mires, MD [facility] 11/02/2013, 1:09 PM Pager:  [phone] After 3pm call: [phone]

## 2013-11-03 ENCOUNTER — Inpatient Hospital Stay (HOSPITAL_COMMUNITY): Payer: Medicare Other

## 2013-11-03 LAB — BODY FLUID CULTURE: CULTURE: NO GROWTH

## 2013-11-03 NOTE — Care Management (Signed)
11/03/13 16:00 CM called [facility] DME rep concerning pt's Oxygen at home.  Pt reports [facility] needs to fix compressor.  [facility] rep, Jeanella Anton states she will follow up with pt.  Mariane Masters, BSN, CM [phone].

## 2013-11-03 NOTE — Progress Notes (Signed)
Progress Note   [name] [name] MBT:[id number] DOB: Jan 27, 1956 DOA: 10/30/2013 PCP: Woody Seller, MD   Brief Narrative:   [name] [name] is an 58 y.o. 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 CT scan, done on admission, showed a large left pleural effusion, stable right pleural collection with pleural drain in place, increased widespread nodularity of the left lung suspicious for lymphangitic carcinoma and possible postobstructive pneumonia on the right.  Assessment/Plan:   Principal Problem:   Acute on chronic respiratory failure with hypoxia, multifactorial, with progressive metastatic lung cancer, malignant pleural effusions, and postobstructive pneumonia all contributory  Admitted to the SDU and placed on 10 L of oxygen by nonrebreather mask, which has now been weaned to 2 L N/C.  Status post 1.8 L thoracentesis 10/31/13.  F/U cultures, AFB, cytology.  Studies consistent with exudate.  Continue empiric vancomycin and Zosyn to cover healthcare associated postobstructive pneumonia. Followup blood cultures.  Transfer to floor, mobilize.  Active Problems:   Ascites  Likely malignant. So far, no need for paracentesis.    Metastatic non-small cell lung cancer  Dr. Julien Nordmann following, considering  immunotherapy versus Hospice since he has had disease progression on Tarceva.  CODE STATUS discussed by admitting physician, patient wishes to remain a full code.    Bilateral malignant pleural effusions   Has a Pleurx catheter on the right in place which can be drained as needed.  May need left Pleurx. CVTS consult pending.  Status post left sided thoracentesis.    Stage III chronic kidney disease  Creatinine stable.    Hypertension  Norvasc and metoprolol currently on hold. BP remains soft.    Protein-calorie malnutrition, severe / underweight  Patient meets the criteria for severe MALNUTRITION in the context of chronic illness with 7% weight loss in 3 months, PO intake <75% of estimated needs, and severe fat and muscle depletion.  BMI 21.  Being followed by dietician.    Depression and anxiety   Continue Xanax and Zoloft.    DVT Prophylaxis  Continue SCDs.  Code Status: Full. Family Communication: No family currently at the bedside. Disposition Plan: Home when stable.   IV Access:    Peripheral IV   Procedures and diagnostic studies:    Chest x-ray 10/30/13: Progressive bilateral pulmonary opacity favored to reflect increasing pleural effusions. Right chest tube remains in place. No pneumothorax.   CT chest 10/31/13: 1. Progressed left pleural effusion, large. Stable right pleural collection with pleural drain in place. 2. Increased widespread nodularity in the left lung suspicious for lymphangitic carcinoma, but might instead indicate acute viral/atypical pneumonia. 3. Stable abnormal right lung parenchyma, probably a combination of pulmonary tumor and postobstructive infection. 4. Increased ascites, small to moderate.    Thoracentesis 10/31/13: 1800 ml of amber yellow pleural fluid was obtained. A sample was sent to Pathology for cytology and cell counts, as well as for infection analysis.   Dg Chest Port 1 view 10/31/13: 1. No evidence of  pneumothorax  status post left thoracentesis. 2. Significantly reduced left pleural effusion.   Dg Chest Port 1 view 11/02/13: 1. Power port catheter and right chest tube in stable position. 2. Persistent bilateral pulmonary infiltrates with progressive patchy infiltrates in the left upper lobe. 3. Persistent unchanged bilateral pleural effusions. 4. Persistent cardiomegaly.    Medical Consultants:    Dr. Kara Mead, Pulmonology   Other Consultants:    Dietician   Anti-Infectives:    Vancomycin 10/31/13--->  Zosyn 10/31/13--->  Subjective:   [name] is comfortable with no complaints of dyspnea or cough this morning. No complaints of pain. Bowels moved yesterday. No nausea or vomiting.   Objective:    Filed Vitals:   11/03/13 0210 11/03/13 0315 11/03/13 0400 11/03/13 0600  BP:   114/68 97/59  Pulse: 83 81 81 81  Temp:   98.7 F (37.1 C)   TempSrc:   Oral   Resp: 15 15 15 13   Height:      Weight:   70.4 kg (155 lb 3.3 oz)   SpO2: 96% 94% 94% 98%    Intake/Output Summary (Last 24 hours) at 11/03/13 0730 Last data filed at 11/03/13 0641  Gross per 24 hour  Intake   1230 ml  Output    851 ml  Net    379 ml    Exam: Gen:  NAD Cardiovascular:  RRR, No M/R/G Respiratory:  Lungs diminished in bases bilaterally Gastrointestinal:  Abdomen soft, NT/ND, + BS Extremities:  No C/E/C, SCDs on   Data Reviewed:    Labs: Basic Metabolic Panel:  Recent Labs Lab 10/31/13 0133 11/01/13 0346 11/02/13 0515  NA 141 144 141  K 4.3 4.2 3.9  CL 98 100 99  CO2 35* 37* 34*  GLUCOSE 117* 103* 101*  BUN 14 18 14   CREATININE 1.06 1.27 1.23  CALCIUM 8.8 8.7 8.5   GFR Estimated Creatinine Clearance: 64.4 ml/min (by C-G formula based on Cr of 1.23). Liver Function Tests:  Recent Labs Lab 10/31/13 0133 10/31/13 1144  AST 20  --   ALT 11  --   ALKPHOS 80  --   BILITOT 0.2*  --   PROT 5.4* 4.8*  ALBUMIN 2.0*  --    Coagulation profile  Recent Labs Lab 10/31/13 0346    INR 1.05    CBC:  Recent Labs Lab 10/31/13 0133 11/01/13 0346  WBC 12.9* 12.6*  NEUTROABS 11.1*  --   HGB 12.8* 12.3*  HCT 40.8 39.6  MCV 85.5 85.3  PLT 424* 414*   Cardiac Enzymes:  Recent Labs Lab 10/31/13 0133  TROPONINI <0.30   BNP (last 3 results)  Recent Labs  09/24/13 1637  PROBNP 221.7*   Microbiology Recent Results (from the past 240 hour(s))  CULTURE, BLOOD (ROUTINE X 2)     Status: None   Collection Time    10/31/13  1:33 AM      Result Value Ref Range Status   Specimen Description Blood RIGHT ANTECUBITAL   Final   Special Requests BOTTLES DRAWN AEROBIC AND ANAEROBIC 6CC   Final   Culture NO GROWTH 2 DAYS   Final   Report Status PENDING   Incomplete  CULTURE, BLOOD (ROUTINE X 2)     Status: None   Collection Time    10/31/13  1:45 AM      Result Value Ref Range Status   Specimen Description Blood BLOOD LEFT HAND   Final   Special Requests BOTTLES DRAWN AEROBIC AND  ANAEROBIC [place]   Final   Culture NO GROWTH 2 DAYS   Final   Report Status PENDING   Incomplete  MRSA PCR SCREENING     Status: None   Collection Time    10/31/13  6:27 AM      Result Value Ref Range Status   MRSA by PCR NEGATIVE  NEGATIVE Final   Comment:            The GeneXpert MRSA Assay (FDA     approved for NASAL specimens     only), is one component of a     comprehensive MRSA colonization     surveillance program. It is not     intended to diagnose MRSA     infection nor to guide or     monitor treatment for     MRSA infections.  AFB CULTURE WITH SMEAR     Status: None   Collection Time    10/31/13 11:00 AM      Result Value Ref Range Status   Specimen Description PLEURAL   Final   Special Requests NONE   Final   Acid Fast Smear     Final   Value: NO ACID FAST BACILLI SEEN     Performed at [facility]   Culture     Final   Value: CULTURE WILL BE EXAMINED FOR 6 WEEKS BEFORE ISSUING A FINAL REPORT     Performed at [facility]   Report Status  PENDING   Incomplete  BODY FLUID CULTURE     Status: None   Collection Time    10/31/13 11:00 AM      Result Value Ref Range Status   Specimen Description PLEURAL   Final   Special Requests NONE   Final   Gram Stain     Final   Value: MODERATE WBC PRESENT, PREDOMINANTLY MONONUCLEAR     NO ORGANISMS SEEN     Performed at [facility]   Culture     Final   Value: NO GROWTH 2 DAYS     Performed at [facility]   Report Status PENDING   Incomplete  FUNGAL STAIN     Status: None   Collection Time    10/31/13 11:00 AM      Result Value Ref Range Status   Specimen Description PLEURAL   Final   Special Requests NONE   Final   Fungal Smear     Final   Value: NO YEAST OR FUNGAL ELEMENTS SEEN     Performed at [facility]   Report Status 11/01/2013 FINAL   Final     Medications:   . antiseptic oral rinse  7 mL Mouth Rinse BID  . guaiFENesin  1,200 mg Oral BID  . ipratropium-albuterol  3 mL Nebulization TID  . OxyCODONE  40 mg Oral 3 times per day  . piperacillin-tazobactam (ZOSYN)  IV  3.375 g Intravenous 3 times per day  . sertraline  100 mg Oral QHS  . sodium chloride  3 mL Intravenous Q12H  . sodium chloride  3 mL Intravenous Q12H  . vancomycin  750 mg Intravenous Q12H   Continuous Infusions:   Time spent: 25 minutes.   LOS: 4 days   [facility] Pager [phone]. If unable to reach me by pager, please call my cell phone at [phone].  *Please refer to [email or web address], password [email or web address] to get updated schedule on who will round on this patient,  as hospitalists switch teams weekly. If 7PM-7AM, please contact night-coverage at [email or web address], password [email or web address] for any overnight needs.  11/03/2013, 7:30 AM

## 2013-11-03 NOTE — Evaluation (Signed)
Physical Therapy Evaluation Patient Details Name: [name] [name] MRN: [id number] DOB: 1955/08/31 Today's Date: 11/03/2013   History of Present Illness  58 yo male admitted with resp failure. Hx of stage IV lung cancer with mets, HTN, Sz d/o, O2 dependency  Clinical Impression  On eval pt was Min guard assist for mobility-able to ambulate ~200 feet while holding onto IV pole on 4L O2. O2 sats dropped to 83% on 4L  During ambulation. Recommend daily ambulation with nursing supervision during hospital stay. Do not anticipate and follow up PT needs at discharge.     Follow Up Recommendations No PT follow up;Supervision - Intermittent    Equipment Recommendations  None recommended by PT    Recommendations for Other Services       Precautions / Restrictions Precautions Precautions: Fall Precaution Comments: O2 dep Restrictions Weight Bearing Restrictions: No      Mobility  Bed Mobility Overal bed mobility: Modified Independent                Transfers Overall transfer level: Needs assistance   Transfers: Sit to/from Stand Sit to Stand: Min guard         General transfer comment: close guard for safety  Ambulation/Gait Ambulation/Gait assistance: Min guard Ambulation Distance (Feet): 200 Feet Assistive device:  (pt held onto IV pole) Gait Pattern/deviations: Step-through pattern;Decreased stride length     General Gait Details: tolerated fairly well. O2 sats dropped to 83% on 4L O2 during ambulation. Dyspnea 2/4.   Stairs            Wheelchair Mobility    Modified Rankin (Stroke Patients Only)       Balance                                             Pertinent Vitals/Pain Pain Assessment: No/denies pain    Home Living Family/patient expects to be discharged to:: Private residence Living Arrangements: Spouse/significant other Available Help at Discharge: Family Type of Home: House Home Access: Stairs to enter   Engineer, site of Steps: 3-4 Home Layout: One level Home Equipment: None      Prior Function Level of Independence: Independent               Hand Dominance        Extremity/Trunk Assessment   Upper Extremity Assessment: Overall WFL for tasks assessed           Lower Extremity Assessment: Generalized weakness      Cervical / Trunk Assessment: Normal  Communication   Communication: No difficulties  Cognition Arousal/Alertness: Awake/alert Behavior During Therapy: WFL for tasks assessed/performed Overall Cognitive Status: Within Functional Limits for tasks assessed                      General Comments      Exercises        Assessment/Plan    PT Assessment Patient needs continued PT services  PT Diagnosis Difficulty walking   PT Problem List Decreased activity tolerance;Decreased balance;Decreased mobility  PT Treatment Interventions Gait training;Functional mobility training;Therapeutic activities;Therapeutic exercise;Patient/family education;Balance training   PT Goals (Current goals can be found in the Care Plan section) Acute Rehab PT Goals Patient Stated Goal: get better. home soon. PT Goal Formulation: With patient Time For Goal Achievement: 11/17/13 Potential to Achieve Goals: Fair    Frequency Min 3X/week  Barriers to discharge        Co-evaluation               End of Session Equipment Utilized During Treatment: Oxygen Activity Tolerance: Patient tolerated treatment well Patient left: in bed;with call bell/phone within reach           Time: 6389-3734 PT Time Calculation (min): 50 min   Charges:   PT Evaluation $Initial PT Evaluation Tier I: 1 Procedure PT Treatments $Gait Training: 23-37 mins $Therapeutic Activity: 8-22 mins   PT G Codes:          Weston Anna, MPT Pager: [phone]

## 2013-11-04 DIAGNOSIS — R188 Other ascites: Secondary | ICD-10-CM

## 2013-11-04 LAB — BASIC METABOLIC PANEL
ANION GAP: 8 (ref 5–15)
BUN: 12 mg/dL (ref 6–23)
CHLORIDE: 99 meq/L (ref 96–112)
CO2: 36 meq/L — AB (ref 19–32)
Calcium: 8.7 mg/dL (ref 8.4–10.5)
Creatinine, Ser: 1.23 mg/dL (ref 0.50–1.35)
GFR calc Af Amer: 73 mL/min — ABNORMAL LOW (ref >90)
GFR calc non Af Amer: 63 mL/min — ABNORMAL LOW (ref >90)
GLUCOSE: 112 mg/dL — AB (ref 70–99)
POTASSIUM: 4.4 meq/L (ref 3.7–5.3)
SODIUM: 143 meq/L (ref 137–147)

## 2013-11-04 MED ORDER — ZOLPIDEM TARTRATE 10 MG PO TABS: 10.0000 mg | ORAL_TABLET | Freq: Every evening | ORAL | Status: AC | PRN

## 2013-11-04 MED ORDER — ONDANSETRON HCL 4 MG PO TABS: 4.0000 mg | ORAL_TABLET | Freq: Four times a day (QID) | ORAL | Status: AC | PRN

## 2013-11-04 MED ORDER — ALUM & MAG HYDROXIDE-SIMETH 200-200-20 MG/5ML PO SUSP: 30.0000 mL | Freq: Four times a day (QID) | ORAL | Status: AC | PRN

## 2013-11-04 MED ORDER — ALPRAZOLAM 1 MG PO TABS: 1.0000 mg | ORAL_TABLET | Freq: Three times a day (TID) | ORAL | Status: DC | PRN

## 2013-11-04 MED ORDER — MAGIC MOUTHWASH W/LIDOCAINE: 5.0000 mL | Freq: Four times a day (QID) | ORAL | Status: AC | PRN

## 2013-11-04 MED ORDER — BIOTENE DRY MOUTH MT LIQD: 15.0000 mL | Freq: Two times a day (BID) | OROMUCOSAL | Status: AC

## 2013-11-04 MED ORDER — AMLODIPINE BESYLATE 5 MG PO TABS: 5.0000 mg | ORAL_TABLET | Freq: Every day | ORAL | Status: AC

## 2013-11-04 MED ORDER — FLUTICASONE PROPIONATE 50 MCG/ACT NA SUSP: 1.0000 | Freq: Every day | NASAL | Status: AC | PRN

## 2013-11-04 MED ORDER — ENSURE PO LIQD: 237.0000 mL | Freq: Two times a day (BID) | ORAL | Status: AC

## 2013-11-04 MED ORDER — OXYCODONE HCL ER 40 MG PO T12A: 40.0000 mg | EXTENDED_RELEASE_TABLET | Freq: Two times a day (BID) | ORAL | Status: AC

## 2013-11-04 MED ORDER — LEVOFLOXACIN 750 MG PO TABS
750.0000 mg | ORAL_TABLET | Freq: Every day | ORAL | Status: DC
  Administered 2013-11-04 – 2013-11-05 (×2): 750 mg via ORAL
  Filled 2013-11-04 (×2): qty 1

## 2013-11-04 MED ORDER — OXYCODONE HCL 20 MG PO TABS: 20.0000 mg | ORAL_TABLET | Freq: Three times a day (TID) | ORAL | Status: AC | PRN

## 2013-11-04 NOTE — Progress Notes (Signed)
Patient ID: [name] [name], male   DOB: [birth year], 58 y.o.   MRN: [id number] [facility] PROGRESS NOTE  [name] [name] SNK:[id number] DOB: [birth year] DOA: 10/30/2013 PCP: Woody Seller, MD  Brief narrative: 58 y.o. 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 CT scan, done on admission, showed a large left pleural effusion, stable right pleural collection with pleural drain in place, increased widespread nodularity of the left lung suspicious for lymphangitic carcinoma and possible postobstructive pneumonia on the right.   Assessment/Plan:   Principal Problem:  Acute on chronic respiratory failure with hypoxia, multifactorial, with progressive metastatic lung cancer, malignant pleural effusions, and postobstructive pneumonia all contributory  Admitted to the SDU and placed on 10 L of oxygen by nonrebreather mask, which has now been weaned to 2 L N/C.  Status post 1.8 L thoracentesis 10/31/13. F/U cultures, AFB, blood cultures all negative. Studies consistent with exudate.  Was on empiric vanco and zosyn for HCAP. Abx changed to Levaquin in anticipation of discharge in next 24 hours.  Active Problems:  Ascites  Likely malignant. So far, no need for paracentesis. Metastatic non-small cell lung cancer  Dr. Julien Nordmann  following, considering immunotherapy versus Hospice since he has had disease progression on Tarceva.  CODE STATUS discussed by admitting physician, patient wishes to remain a full code. Bilateral malignant pleural effusions  Has a Pleurx catheter on the right in place which can be drained as needed.  May need left Pleurx. CVTS consult pending.  Status post left sided thoracentesis. Stage III chronic kidney disease  Creatinine stable. Hypertension  Norvasc and metoprolol currently on hold. BP remains soft. Protein-calorie malnutrition, severe / underweight  Patient meets the criteria for severe MALNUTRITION in the context of chronic illness with 7% weight loss in 3 months, PO intake <75% of estimated needs, and severe fat and muscle depletion. Nutritionist consulted. Depression and anxiety  Continue Xanax and Zoloft. DVT Prophylaxis  Continue SCDs while pt is in hospital.   Code Status: Full.  Family Communication: No family currently at the bedside.  Disposition Plan: Home when stable.    IV Access:   Peripheral IV Procedures and diagnostic studies:   Chest x-ray 10/30/13: Progressive bilateral pulmonary opacity favored to reflect increasing pleural effusions. Right chest tube remains in place. No pneumothorax. CT chest 10/31/13: 1. Progressed left pleural effusion, large. Stable right pleural collection with pleural drain in place. 2. Increased widespread nodularity in the left lung suspicious for lymphangitic carcinoma, but might instead indicate acute viral/atypical pneumonia. 3. Stable abnormal right lung parenchyma, probably a combination of pulmonary tumor and postobstructive infection. 4. Increased ascites, small to moderate.  Thoracentesis 10/31/13: 1800 ml of amber yellow pleural fluid was obtained. A sample was sent to Pathology for cytology and cell counts, as well as for infection analysis. Dg Chest Port 1 view 10/31/13: 1. No evidence of pneumothorax status post left  thoracentesis. 2. Significantly reduced left pleural effusion. Dg Chest Port 1 view 11/02/13: 1. Power port catheter and  right chest tube in stable position. 2. Persistent bilateral pulmonary infiltrates with progressive patchy infiltrates in the left upper lobe. 3. Persistent unchanged bilateral pleural effusions. 4. Persistent cardiomegaly.  Medical Consultants:   Dr. Kara Mead, Pulmonology Other Consultants:   Dietician Anti-Infectives:   Vancomycin 10/31/13---> 11/04/2013  Zosyn 10/31/13---> 11/04/2013 Levaquin 11/04/2013 -->   Leisa Lenz, MD  [facility] Pager [phone]  If 7PM-7AM, please contact night-coverage [email or web address] Password [email or web address] 11/04/2013, 5:59 PM   LOS: 5 days    HPI/Subjective: No acute overnight events.  Objective: Filed Vitals:   11/03/13 2237 11/04/13 0520 11/04/13 0805 11/04/13 1440  BP: 120/65 144/82  127/79  Pulse: 88 81  88  Temp: 98.2 F (36.8 C) 98.4 F (36.9 C)  98.3 F (36.8 C)  TempSrc: Oral Oral  Oral  Resp: 20 20  18   Height:      Weight:  66.497 kg (146 lb 9.6 oz)    SpO2: 95% 95% 98% 95%    Intake/Output Summary (Last 24 hours) at 11/04/13 1759 Last data filed at 11/04/13 1620  Gross per 24 hour  Intake 1464.5 ml  Output      0 ml  Net 1464.5 ml    Exam:   General:  Pt is alert, appears cachectic, no acute distress  Cardiovascular: Regular rate and rhythm, S1/S2, no murmurs  Respiratory: diminished breath sounds bilaterally, no wheezing  Abdomen: Soft, non tender, non distended, bowel sounds present  Extremities: No edema, pulses DP and PT palpable bilaterally  Neuro: Grossly nonfocal  Data Reviewed: Basic Metabolic Panel:  Recent Labs Lab 10/31/13 0133 11/01/13 0346 11/02/13 0515 11/04/13 0415  NA 141 144 141 143  K 4.3 4.2 3.9 4.4  CL 98 100 99 99  CO2 35* 37* 34* 36*  GLUCOSE 117* 103* 101* 112*  BUN 14 18 14 12   CREATININE 1.06 1.27 1.23 1.23  CALCIUM 8.8 8.7 8.5 8.7   Liver Function  Tests:  Recent Labs Lab 10/31/13 0133 10/31/13 1144  AST 20  --   ALT 11  --   ALKPHOS 80  --   BILITOT 0.2*  --   PROT 5.4* 4.8*  ALBUMIN 2.0*  --    No results found for this basename: LIPASE, AMYLASE,  in the last 168 hours No results found for this basename: AMMONIA,  in the last 168 hours CBC:  Recent Labs Lab 10/31/13 0133 11/01/13 0346  WBC 12.9* 12.6*  NEUTROABS 11.1*  --   HGB 12.8* 12.3*  HCT 40.8 39.6  MCV 85.5 85.3  PLT 424* 414*   Cardiac Enzymes:  Recent Labs Lab 10/31/13 0133  TROPONINI <0.30   BNP: No components found with this basename: POCBNP,  CBG: No results found for this basename: GLUCAP,  in the last 168 hours  CULTURE, BLOOD (ROUTINE X 2)     Status: None   Collection Time    10/31/13  1:33 AM      Result Value Ref Range Status   Specimen Description BLOOD RIGHT ANTECUBITAL   Final   Culture NO GROWTH 4 DAYS   Final   Report Status PENDING   Incomplete  CULTURE, BLOOD (ROUTINE X 2)     Status: None   Collection Time    10/31/13  1:45 AM      Result Value Ref Range Status   Specimen Description BLOOD LEFT HAND   Final   Culture NO GROWTH 4 DAYS   Final   Report Status PENDING  Incomplete  MRSA PCR SCREENING     Status: None   Collection Time    10/31/13  6:27 AM      Result Value Ref Range Status   MRSA by PCR NEGATIVE  NEGATIVE Final  AFB CULTURE WITH SMEAR     Status: None   Collection Time    10/31/13 11:00 AM      Result Value Ref Range Status   Specimen Description PLEURAL   Final   Value: NO ACID FAST BACILLI SEEN   Value: CULTURE WILL BE EXAMINED FOR 6 WEEKS BEFORE ISSUING A FINAL REPORT     Performed at [facility]   Report Status PENDING   Incomplete  BODY FLUID CULTURE     Status: None   Collection Time    10/31/13 11:00 AM      Result Value Ref Range Status   Specimen Description PLEURAL   Final     NO ORGANISMS SEEN     Performed at [facility]   Culture     Final   Value: NO GROWTH 3  DAYS     Performed at [facility]   Report Status 11/03/2013 FINAL   Final  FUNGAL STAIN     Status: None   Collection Time    10/31/13 11:00 AM      Result Value Ref Range Status   Specimen Description PLEURAL   Final   Value: NO YEAST OR FUNGAL ELEMENTS SEEN     Performed at [facility]   Report Status 11/01/2013 FINAL   Final     Scheduled Meds: . guaiFENesin  1,200 mg Oral BID  . ipratropium-albuterol  3 mL Nebulization TID  . levofloxacin  750 mg Oral Daily  . OxyCODONE  40 mg Oral 3 times per day  . sertraline  100 mg Oral QHS

## 2013-11-04 NOTE — Progress Notes (Signed)
Pt. Had complaints for burning and aching at IV site. RN found pt.'s IV to be infiltrated.  MD was notified. New orders were given to stop IV antibiotics and start PO instead. MD stated that IV could be left out for now with anticipation of discharge tomorrow.

## 2013-11-04 NOTE — Care Management Note (Addendum)
    Page 1 of 2   11/05/2013     11:52:31 AM CARE MANAGEMENT NOTE 11/05/2013  Patient:  [name],[name]   Account Number:  [id number]  Date Initiated:  11/04/2013  Documentation initiated by:  [facility]  Subjective/Objective Assessment:   adm: hypoxia     Action/Plan:   discharge planning   Anticipated DC Date:  11/04/2013   Anticipated DC Plan:  [place]  CM consult      [facility] Choice  HOME HEALTH   Choice offered to / List presented to:  C-1 Patient        [place] arranged  HH-1 RN  HH-2 PT  HH-3 OT  HH-4 NURSE'S AIDE  HH-7 RESPIRATORY THERAPY      Status of service:  Completed, signed off Medicare Important Message given?  YES (If response is "NO", the following Medicare IM given date fields will be blank) Date Medicare IM given:  10/31/2013 Medicare IM given by:   Date Additional Medicare IM given:  11/04/2013 Additional Medicare IM given by:  [facility] [place]  Discharge Disposition:  [place]  Per UR Regulation:    If discussed at Long Length of Stay Meetings, dates discussed:    Comments:  11/05/13 11:40 CM met with pt and wife in room to discuss home oxygen concerns.  CMcalled  [facility] DME for tank of O2 as wife did not bring tank to room to pick up pt. Order requested for onservative regulator for pt's portable tank. No other CM needs were communicated.  Mariane Masters, BSN, [place].   11/04/13 10:15 CM met with pt in room to discuss disposition. Pt  very concerned about [facility]'s response to his oxygen issues at home.  CM offered to have him transition to another  DME agency for oxygen.  But pt ambivalent; CM provided pt with DME list for him and his wife to look over for choice.  CM called [facility] rep, Lucretia who states she will talk with pt.  Pt chooses [facility] to render HHRN/PT/OT/aide/Respiratory care.  CM called referral to [employer], Rhett Bannister.  will continue to follow for DME choice.  Mariane Masters,  BSN, CM [phone].   Dellie Catholic, RN Registered Nurse Signed  Care Management Service date: 11/03/2013 4:06 PM 11/03/13 16:00 CM called [facility] DME rep concerning pt's Oxygen at home.  Pt reports [facility] needs to fix compressor.  [facility] rep, Jeanella Anton states she will follow up with pt.  Mariane Masters, BSN, CM [phone].

## 2013-11-05 LAB — BASIC METABOLIC PANEL
Anion gap: 8 (ref 5–15)
BUN: 11 mg/dL (ref 6–23)
CHLORIDE: 96 meq/L (ref 96–112)
CO2: 35 mEq/L — ABNORMAL HIGH (ref 19–32)
Calcium: 8.7 mg/dL (ref 8.4–10.5)
Creatinine, Ser: 1.21 mg/dL (ref 0.50–1.35)
GFR calc non Af Amer: 64 mL/min — ABNORMAL LOW (ref >90)
GFR, EST AFRICAN AMERICAN: 74 mL/min — AB (ref >90)
Glucose, Bld: 110 mg/dL — ABNORMAL HIGH (ref 70–99)
POTASSIUM: 4.3 meq/L (ref 3.7–5.3)
Sodium: 139 mEq/L (ref 137–147)

## 2013-11-05 LAB — CBC
HEMATOCRIT: 41.3 % (ref 39.0–52.0)
HEMOGLOBIN: 12.3 g/dL — AB (ref 13.0–17.0)
MCH: 26.2 pg (ref 26.0–34.0)
MCHC: 29.8 g/dL — ABNORMAL LOW (ref 30.0–36.0)
MCV: 88.1 fL (ref 78.0–100.0)
Platelets: 328 10*3/uL (ref 150–400)
RBC: 4.69 MIL/uL (ref 4.22–5.81)
RDW: 14.4 % (ref 11.5–15.5)
WBC: 12 10*3/uL — AB (ref 4.0–10.5)

## 2013-11-05 LAB — CULTURE, BLOOD (ROUTINE X 2)
CULTURE: NO GROWTH
Culture: NO GROWTH

## 2013-11-05 MED ORDER — LORAZEPAM 1 MG PO TABS: 1.0000 mg | ORAL_TABLET | Freq: Three times a day (TID) | ORAL | Status: AC | PRN

## 2013-11-05 MED ORDER — LEVOFLOXACIN 750 MG PO TABS: 750.0000 mg | ORAL_TABLET | Freq: Every day | ORAL | Status: AC

## 2013-11-05 NOTE — Plan of Care (Signed)
Problem: ICU Phase Progression Outcomes Goal: Flu/PneumoVaccines if indicated Outcome: Completed/Met Date Met:  11/05/13 Patient received Pneumonia vaccine this admission.  Problem: Phase I Progression Outcomes Goal: O2 sats > or equal 90% or at baseline Outcome: Completed/Met Date Met:  11/05/13 Patient sating greater than 90% on 2 L [place].  Patient will remain on oxygen for discharge.

## 2013-11-05 NOTE — Discharge Instructions (Signed)
Pneumonia Pneumonia is an infection of the lungs.  CAUSES Pneumonia may be caused by bacteria or a virus. Usually, these infections are caused by breathing infectious particles into the lungs (respiratory tract). SIGNS AND SYMPTOMS   Cough.  Fever.  Chest pain.  Increased rate of breathing.  Wheezing.  Mucus production. DIAGNOSIS  If you have the common symptoms of pneumonia, your health care provider will typically confirm the diagnosis with a chest X-ray. The X-ray will show an abnormality in the lung (pulmonary infiltrate) if you have pneumonia. Other tests of your blood, urine, or sputum may be done to find the specific cause of your pneumonia. Your health care provider may also do tests (blood gases or pulse oximetry) to see how well your lungs are working. TREATMENT  Some forms of pneumonia may be spread to other people when you cough or sneeze. You may be asked to wear a mask before and during your exam. Pneumonia that is caused by bacteria is treated with antibiotic medicine. Pneumonia that is caused by the influenza virus may be treated with an antiviral medicine. Most other viral infections must run their course. These infections will not respond to antibiotics.  HOME CARE INSTRUCTIONS   Cough suppressants may be used if you are losing too much rest. However, coughing protects you by clearing your lungs. You should avoid using cough suppressants if you can.  Your health care provider may have prescribed medicine if he or she thinks your pneumonia is caused by bacteria or influenza. Finish your medicine even if you start to feel better.  Your health care provider may also prescribe an expectorant. This loosens the mucus to be coughed up.  Take medicines only as directed by your health care provider.  Do not smoke. Smoking is a common cause of bronchitis and can contribute to pneumonia. If you are a smoker and continue to smoke, your cough may last several weeks after your  pneumonia has cleared.  A cold steam vaporizer or humidifier in your room or home may help loosen mucus.  Coughing is often worse at night. Sleeping in a semi-upright position in a recliner or using a couple pillows under your head will help with this.  Get rest as you feel it is needed. Your body will usually let you know when you need to rest. PREVENTION A pneumococcal shot (vaccine) is available to prevent a common bacterial cause of pneumonia. This is usually suggested for:  People over 65 years old.  Patients on chemotherapy.  People with chronic lung problems, such as bronchitis or emphysema.  People with immune system problems. If you are over 65 or have a high risk condition, you may receive the pneumococcal vaccine if you have not received it before. In some countries, a routine influenza vaccine is also recommended. This vaccine can help prevent some cases of pneumonia.You may be offered the influenza vaccine as part of your care. If you smoke, it is time to quit. You may receive instructions on how to stop smoking. Your health care provider can provide medicines and counseling to help you quit. SEEK MEDICAL CARE IF: You have a fever. SEEK IMMEDIATE MEDICAL CARE IF:   Your illness becomes worse. This is especially true if you are elderly or weakened from any other disease.  You cannot control your cough with suppressants and are losing sleep.  You begin coughing up blood.  You develop pain which is getting worse or is uncontrolled with medicines.  Any of the symptoms   which initially brought you in for treatment are getting worse rather than better.  You develop shortness of breath or chest pain. MAKE SURE YOU:   Understand these instructions.  Will watch your condition.  Will get help right away if you are not doing well or get worse. Document Released: 02/26/2005 Document Revised: 07/13/2013 Document Reviewed: 05/18/2010 [employer] Patient Information 2015  [employer]. This information is not intended to replace advice given to you by your health care provider. Make sure you discuss any questions you have with your health care provider. Pleural Effusion The lining covering your lungs and the inside of your chest is called the pleura. Usually, the space between the two pleura contains no air and only a thin layer of fluid. A pleural effusion is an abnormal buildup of fluid in the pleural space. Fluid gathers when there is increased pressure in the lung vessels. This forces fluids out of the lungs and into the pleural space. Vessels may also leak fluids when there are infections, such as pneumonia, or other causes of soreness and redness (inflammation). Fluids leak into the lungs when protein in the blood is low or when certain vessels (lymphatics) are blocked. Finding a pleural effusion is important because it is usually caused by another disease. In order to treat a pleural effusion, your health care provider needs to find its cause. If left untreated, a large amount of fluid can build up and cause collapse of the lung. CAUSES   Heart failure.  Infections (pneumonia, tuberculosis), pulmonary embolism, pulmonary infarction.  Cancer (primary lung and metastatic), asbestosis.  Liver failure (cirrhosis).  Nephrotic syndrome, peritoneal dialysis, kidney problems (uremia).  Collagen vascular disease (systemic lupus erythematosus, rheumatoid arthritis).  Injury (trauma) to the chest or rupture of the digestive tube (esophagus).  Material in the chest or pleural space (hemothorax, chylothorax).  Pancreatitis.  Surgery.  Drug reactions. SYMPTOMS  A pleural effusion can decrease the amount of space available for breathing and make you short of breath. The fluid can become infected, which may cause pain and fever. Often, the pain is worse when taking a deep breath. The underlying disease (heart failure, pneumonia, blood clot, tuberculosis, cancer)  may also cause symptoms. DIAGNOSIS   Your health care provider can usually tell what is wrong by talking to you (taking a history), doing an exam, and taking a routine X-ray. If the X-ray shows fluid in your chest, often fluid is removed from your chest with a needle for testing (diagnostic thoracentesis).  Sometimes, more specialized X-rays may be needed.  Sometimes, a small piece of tissue is removed and examined by a specialist (biopsy). TREATMENT  Treatment varies based on what caused the pleural effusion. Treatments include:  Removing as much fluid as possible using a needle (thoracentesis) to improve the cough and shortness of breath. This is a simple procedure that can be done at bedside. The risks are bleeding, infection, collapse of a lung, or low blood pressure.  Placing a tube in the chest to drain the effusion (tube thoracostomy). This is often used when there is an infection in the fluid. This is a simple procedure that can often be done at bedside or in a clinic. The procedure may be painful. The risks are the same as using a needle to drain the fluid. The chest tube usually remains for a few days and is connected to suction to improve fluid drainage. After placement, the tube usually does not cause much discomfort.  Surgical removal of fibrous debris   in and around the pleural space (decortication). This may be done with a flexible telescope (thoracoscope) through a small or large cut (incision). This is helpful for patients who have fibrosis or scar tissue that prevents complete lung expansion. The risks are infection, blood loss, and side effects from general anesthesia.  Sometimes, a procedure called pleurodesis is done. A chest tube is placed and the fluid is drained. Next, an agent (tetracycline, talc powder) is added to the pleural space. This causes the lung and chest wall to stick together (adhesion). This leaves no potential space for fluid to build up. The risks include  infection, blood loss, and side effects from general anesthesia.  If the effusion is caused by infection, it may be treated with antibiotics and may improve without draining. HOME CARE INSTRUCTIONS   Take any medicines exactly as prescribed.  Follow up with your health care provider as directed.  Monitor your exercise capacity (the amount of walking you can do before you get short of breath).  Do not use any tobacco products including cigarettes, chewing tobacco, or electronic cigarettes. SEEK MEDICAL CARE IF:   Your exercise capacity seems to get worse or does not improve with time.  You do not recover from your illness.  You have drainage, redness, swelling, or pain at any incision or puncture sites. SEEK IMMEDIATE MEDICAL CARE IF:   Shortness of breath or chest pain develops or gets worse.  You have a fever.  You develop a new cough, especially if the mucus (phlegm) is discolored. MAKE SURE YOU:   Understand these instructions.  Will watch your condition.  Will get help right away if you are not doing well or get worse. Document Released: 02/26/2005 Document Revised: 07/13/2013 Document Reviewed: 10/18/2006 [employer] Patient Information 2015 [employer]. This information is not intended to replace advice given to you by your health care provider. Make sure you discuss any questions you have with your health care provider.  

## 2013-11-05 NOTE — Progress Notes (Signed)
Discharge instructions reviewed with patient and his spouse using teach back method.  They demonstrated understanding.  Home oxygen already arranged and oxygen tank in the room for transport home.  Patient stable for discharge.  Assessment unchanged from this am.  [facility]  11/05/2013  1:23 PM

## 2013-11-05 NOTE — Discharge Summary (Signed)
Physician Discharge Summary  [name] [name] PNT:[id number] DOB: 03-07-1956 DOA: 10/30/2013  PCP: Woody Seller, MD  Admit date: 10/30/2013 Discharge date: 11/05/2013  Recommendations for Outpatient Follow-up:  1. Continue Levaquin for 9 more days on discharge to complete the treatment for pneumonia. 2. Followup in cancer Center per scheduled appointment.  Discharge Diagnoses:  Principal Problem:   Acute on chronic respiratory failure with hypoxia Active Problems:   Lung cancer   Recurrent right pleural effusion   Stage III chronic kidney disease   Protein-calorie malnutrition, severe   Hypoxia   Depression with anxiety   Postobstructive pneumonia, healthcare associated   Ascites   Essential hypertension, benign    Discharge Condition: stable   Diet recommendation: as tolerated   History of present illness:  58 y.o. 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 CT scan, done on admission, showed a large left pleural effusion, stable right pleural collection with pleural drain in place, increased widespread nodularity of the left lung suspicious for lymphangitic carcinoma and possible postobstructive pneumonia on the right.   Assessment/Plan:   Principal Problem:  Acute on chronic respiratory failure  with hypoxia, multifactorial, with progressive metastatic lung cancer, malignant pleural effusions, and postobstructive pneumonia all contributory  Admitted to the SDU and placed on 10 L of oxygen by nonrebreather mask, which has now been weaned to 2 L N/C.  Status post 1.8 L thoracentesis 10/31/13. F/U cultures, AFB, blood cultures all negative. Studies consistent with exudate.  Was on empiric vanco and zosyn for HCAP. Abx changed to Levaquin which he will continue for 9 more days on discharge. Home health orders are all in place including a nurse, physical and occupational therapy and aide, respiratory therapy. Active Problems:  Ascites  Likely malignant. So far, no need for paracentesis. Metastatic non-small cell lung cancer  Dr. Julien Nordmann following, considering immunotherapy versus Hospice since he has had disease progression on Tarceva.  CODE STATUS discussed by admitting physician, patient wishes to remain a full code. [place] will schedule appointment for the patient on discharge. Bilateral malignant pleural effusions  Has a Pleurx catheter on the right in place which can be drained as needed.  May need left Pleurx. Respiratory status is stable at this time. Status post left sided thoracentesis. Stage III chronic kidney disease  Creatinine stable. Hypertension  Continue Norvasc only. Blood pressure is 129/66. Stable. Protein-calorie malnutrition, severe / underweight  Patient meets the criteria for severe MALNUTRITION in the context of chronic illness with 7% weight loss in 3 months, PO intake <75% of estimated needs, and severe fat and muscle depletion.  Nutritionist consulted. Depression and anxiety  Continue Zoloft and the Xanax was changed to Ativan.. DVT Prophylaxis  Continue SCDs while pt is in hospital.   Code Status: Full.  Family Communication: No family currently at the bedside.   IV Access:   Peripheral IV Procedures and diagnostic studies:   Chest x-ray  10/30/13: Progressive bilateral pulmonary opacity favored to reflect increasing pleural  effusions. Right chest tube remains in place. No pneumothorax.  CT chest 10/31/13: 1. Progressed left pleural effusion, large. Stable right pleural collection with pleural drain in place. 2. Increased widespread nodularity in the left lung suspicious for lymphangitic carcinoma, but might instead indicate acute viral/atypical pneumonia. 3. Stable abnormal right lung parenchyma, probably a combination of pulmonary tumor and postobstructive infection. 4. Increased ascites, small to moderate.  Thoracentesis 10/31/13: 1800 ml of amber yellow pleural fluid was obtained. A sample was sent to Pathology for cytology and cell counts, as well as for infection analysis.  Dg Chest Port 1 view 10/31/13: 1. No evidence of pneumothorax status post left thoracentesis. 2. Significantly reduced left pleural effusion.  Dg Chest Port 1 view 11/02/13: 1. Power port catheter and right chest tube in stable position. 2. Persistent bilateral pulmonary infiltrates with progressive patchy infiltrates in the left upper lobe. 3. Persistent unchanged bilateral pleural effusions. 4. Persistent cardiomegaly.  Medical Consultants:   Dr. Kara Mead, Pulmonology Other Consultants:   Dietician Anti-Infectives:   Vancomycin 10/31/13---> 11/04/2013  Zosyn 10/31/13---> 11/04/2013  Levaquin 11/04/2013 --> for 9 days on discharge.    SignedLeisa Lenz, MD  [facility] 11/05/2013, 10:20 AM  Pager #: [phone]   Discharge Exam: Filed Vitals:   11/05/13 0500  BP: 129/66  Pulse: 90  Temp: 98.1 F (36.7 C)  Resp: 20   Filed Vitals:   11/04/13 1931 11/04/13 2305 11/05/13 0500 11/05/13 0850  BP:  125/73 129/66   Pulse:  87 90   Temp:  98.2 F (36.8 C) 98.1 F (36.7 C)   TempSrc:  Oral Oral   Resp:  20 20   Height:      Weight:   66.452 kg (146 lb 8 oz)   SpO2: 93% 96% 93% 91%    General: Pt is alert, follows commands  appropriately, not in acute distress; cachectic Cardiovascular: Regular rate and rhythm, S1/S2 +, no murmurs Respiratory: Diminished breath sounds, no wheezing Abdominal: Soft, non tender, non distended, bowel sounds +, no guarding Extremities: no edema, no cyanosis, pulses palpable bilaterally DP and PT Neuro: Grossly nonfocal  Discharge Instructions  Discharge Instructions   Call MD for:  difficulty breathing, headache or visual disturbances    Complete by:  As directed      Call MD for:  persistant dizziness or light-headedness    Complete by:  As directed      Call MD for:  persistant nausea and vomiting    Complete by:  As directed      Call MD for:  severe uncontrolled pain    Complete by:  As directed      Diet - low sodium heart healthy    Complete by:  As directed      Discharge instructions    Complete by:  As directed   1. Continue Levaquin for 9 more days on discharge to complete the treatment for pneumonia. 2. Followup in cancer Center per scheduled appointment.     Increase activity slowly    Complete by:  As directed             Medication List    STOP taking these medications       ALPRAZolam 1 MG tablet  Commonly known as:  XANAX     amoxicillin-clavulanate 875-125 MG per tablet  Commonly known as:  AUGMENTIN     metoprolol succinate 50 MG 24 hr tablet  Commonly known as:  TOPROL-XL  TAKE these medications       alum & mag hydroxide-simeth 200-200-20 MG/5ML suspension  Commonly known as:  MAALOX/MYLANTA  Take 30 mLs by mouth 4 (four) times daily as needed for indigestion or heartburn.     amLODipine 5 MG tablet  Commonly known as:  NORVASC  Take 1 tablet (5 mg total) by mouth at bedtime.     antiseptic oral rinse Liqd  15 mLs by Mouth Rinse route 2 (two) times daily.     ENSURE  Take 237 mLs by mouth 2 (two) times daily between meals.     fluticasone 50 MCG/ACT nasal spray  Commonly known as:  FLONASE  Place 1 spray into both nostrils  daily as needed for allergies.     folic acid 967 MCG tablet  Commonly known as:  FOLVITE  Take 400 mcg by mouth daily.     guaiFENesin 600 MG 12 hr tablet  Commonly known as:  MUCINEX  Take 2 tablets (1,200 mg total) by mouth 2 (two) times daily.     ipratropium-albuterol 0.5-2.5 (3) MG/3ML Soln  Commonly known as:  DUONEB  Take 3 mLs by nebulization 3 (three) times daily.     levofloxacin 750 MG tablet  Commonly known as:  LEVAQUIN  Take 1 tablet (750 mg total) by mouth daily.     LORazepam 1 MG tablet  Commonly known as:  ATIVAN  Take 1 tablet (1 mg total) by mouth every 8 (eight) hours as needed for anxiety.     magic mouthwash w/lidocaine Soln  Take 5 mLs by mouth 4 (four) times daily as needed for mouth pain.     multivitamin with minerals Tabs tablet  Take 1 tablet by mouth daily.     ondansetron 4 MG tablet  Commonly known as:  ZOFRAN  Take 1 tablet (4 mg total) by mouth every 6 (six) hours as needed for nausea.     OxyCODONE 40 mg T12a 12 hr tablet  Commonly known as:  OXYCONTIN  Take 1 tablet (40 mg total) by mouth every 12 (twelve) hours.     Oxycodone HCl 20 MG Tabs  Take 1 tablet (20 mg total) by mouth 3 (three) times daily as needed (pain).     polyvinyl alcohol 1.4 % ophthalmic solution  Commonly known as:  LIQUIFILM TEARS  Place 1 drop into both eyes as needed for dry eyes.     sertraline 100 MG tablet  Commonly known as:  ZOLOFT  Take 100 mg by mouth at bedtime.     zolpidem 10 MG tablet  Commonly known as:  AMBIEN  Take 1 tablet (10 mg total) by mouth at bedtime as needed for sleep.           Follow-up Information   Follow up with [facility]. (home health physical and occupational therapy, nurse and aide, respiratory care)    Specialty:  [place] information:   [place] [id number] [phone]        The results of significant diagnostics from this hospitalization (including  imaging, microbiology, ancillary and laboratory) are listed below for reference.    Significant Diagnostic Studies: Ct Chest Wo Contrast  10/31/2013   CLINICAL DATA:  58 year old male with shortness of breath, weakness, chest pain. Hypoxia. Initial encounter. Lung cancer.  EXAM: CT CHEST WITHOUT CONTRAST  TECHNIQUE: Multidetector CT imaging of the chest was performed following the standard protocol without IV contrast.  COMPARISON:  Portable  chest radiograph 10/30/2013 and earlier.  FINDINGS: Left pleural effusion has progressed and is large. Increased left lung compressive atelectasis. Superimposed increased widespread nodular (tree-in-bud) as well as patchy peribronchovascular opacity.  Trachea remains patent. Loss of the right lower lobe airway as before. Stable confluent peribronchovascular opacity in the aerated right lung. Chronic right pleural collection with pleural enhancement. Right side pleural drain remains in place. Size of this collection is stable. Small volume gas within as before.  No pericardial effusion. Stable mediastinal soft tissues. Right chest porta cath.  Increased upper abdominal ascites, small to moderate in volume. Otherwise stable visualized upper abdominal viscera.  Stable visualized osseous structures.  IMPRESSION: 1. Progressed left pleural effusion, large. Stable right pleural collection with pleural drain in place. 2. Increased widespread nodularity in the left lung suspicious for lymphangitic carcinoma, but might instead indicate acute viral/atypical pneumonia. 3. Stable abnormal right lung parenchyma, probably a combination of pulmonary tumor and postobstructive infection. 4. Increased ascites, small to moderate.   Electronically Signed   By: Lars Pinks M.D.   On: 10/31/2013 03:00   Dg Chest Port 1 View  11/03/2013   CLINICAL DATA:  Pleural effusion.  EXAM: PORTABLE CHEST - 1 VIEW  COMPARISON:  10/31/2013.  FINDINGS: PowerPort catheter in stable position. Right chest tube in  stable position. Cardiomegaly. Bilateral pulmonary alveolar infiltrates are noted. Progressive patchy infiltrates within the left upper lobe. Bilateral pleural effusions are present and unchanged. No pneumothorax. No acute osseous abnormality.  IMPRESSION: 1. Power port catheter and right chest tube in stable position. 2. Persistent bilateral pulmonary infiltrates with progressive patchy infiltrates in the left upper lobe. 3. Persistent unchanged bilateral pleural effusions. 4. Persistent cardiomegaly.   Electronically Signed   By: Marcello Moores  Register   On: 11/03/2013 07:11   Dg Chest Port 1 View  10/31/2013   CLINICAL DATA:  Status post thoracentesis  EXAM: PORTABLE CHEST - 1 VIEW  COMPARISON:  Prior CT scan of the chest 10/31/2013; prior chest x-ray 10/30/2013  FINDINGS: Interval reduction in volume of the left pleural effusion. There is still a mild-to-moderate residual layering effusion. No evidence of pneumothorax. A tunneled right pleural drain is present. Right IJ approach single-lumen port catheter remains in good position. Chronic atelectasis and parenchymal disease throughout the right lung similar compared to prior. There is been improved expansion of the atelectatic left lower lobe. Similar appearance of central bronchitic changes and interstitial prominence. Cardiac and mediastinal contours remain unchanged.  IMPRESSION: 1. No evidence of pneumothorax status post left thoracentesis. 2. Significantly reduced left pleural effusion.   Electronically Signed   By: Jacqulynn Cadet M.D.   On: 10/31/2013 11:47   Dg Chest Portable 1 View  10/31/2013   CLINICAL DATA:  58 year old male with shortness of breath. Initial encounter. Lung cancer.  EXAM: PORTABLE CHEST - 1 VIEW  COMPARISON:  10/05/2013 and earlier.  FINDINGS: Portable AP upright view at 2342 hrs. Progressive opacification of the bilateral mid and lower lungs. Obscuration of the diaphragm. Right pleural drain or chest tube in place. No pneumothorax  identified. Grossly stable mediastinal contours. Aerated left upper lobe appears stable. Visualized tracheal air column is within normal limits.  IMPRESSION: Progressive bilateral pulmonary opacity favored to reflect increasing pleural effusions. Right chest tube remains in place. No pneumothorax.   Electronically Signed   By: Lars Pinks M.D.   On: 10/31/2013 00:40    Microbiology: Recent Results (from the past 240 hour(s))  CULTURE, BLOOD (ROUTINE X 2)  Status: None   Collection Time    10/31/13  1:33 AM      Result Value Ref Range Status   Specimen Description BLOOD RIGHT ANTECUBITAL   Final   Special Requests BOTTLES DRAWN AEROBIC AND ANAEROBIC 6CC   Final   Culture NO GROWTH 4 DAYS   Final   Report Status PENDING   Incomplete  CULTURE, BLOOD (ROUTINE X 2)     Status: None   Collection Time    10/31/13  1:45 AM      Result Value Ref Range Status   Specimen Description BLOOD LEFT HAND   Final   Special Requests BOTTLES DRAWN AEROBIC AND ANAEROBIC [place]   Final   Culture NO GROWTH 4 DAYS   Final   Report Status PENDING   Incomplete  MRSA PCR SCREENING     Status: None   Collection Time    10/31/13  6:27 AM      Result Value Ref Range Status   MRSA by PCR NEGATIVE  NEGATIVE Final   Comment:            The GeneXpert MRSA Assay (FDA     approved for NASAL specimens     only), is one component of a     comprehensive MRSA colonization     surveillance program. It is not     intended to diagnose MRSA     infection nor to guide or     monitor treatment for     MRSA infections.  AFB CULTURE WITH SMEAR     Status: None   Collection Time    10/31/13 11:00 AM      Result Value Ref Range Status   Specimen Description PLEURAL   Final   Special Requests NONE   Final   Acid Fast Smear     Final   Value: NO ACID FAST BACILLI SEEN     Performed at [facility]   Culture     Final   Value: CULTURE WILL BE EXAMINED FOR 6 WEEKS BEFORE ISSUING A FINAL REPORT     Performed at  [facility]   Report Status PENDING   Incomplete  BODY FLUID CULTURE     Status: None   Collection Time    10/31/13 11:00 AM      Result Value Ref Range Status   Specimen Description PLEURAL   Final   Special Requests NONE   Final   Gram Stain     Final   Value: MODERATE WBC PRESENT, PREDOMINANTLY MONONUCLEAR     NO ORGANISMS SEEN     Performed at [facility]   Culture     Final   Value: NO GROWTH 3 DAYS     Performed at [facility]   Report Status 11/03/2013 FINAL   Final  FUNGAL STAIN     Status: None   Collection Time    10/31/13 11:00 AM      Result Value Ref Range Status   Specimen Description PLEURAL   Final   Special Requests NONE   Final   Fungal Smear     Final   Value: NO YEAST OR FUNGAL ELEMENTS SEEN     Performed at [facility]   Report Status 11/01/2013 FINAL   Final     Labs: Basic Metabolic Panel:  Recent Labs Lab 10/31/13 0133 11/01/13 0346 11/02/13 0515 11/04/13 0415 11/05/13 0512  NA 141 144 141 143 139  K  4.3 4.2 3.9 4.4 4.3  CL 98 100 99 99 96  CO2 35* 37* 34* 36* 35*  GLUCOSE 117* 103* 101* 112* 110*  BUN 14 18 14 12 11   CREATININE 1.06 1.27 1.23 1.23 1.21  CALCIUM 8.8 8.7 8.5 8.7 8.7   Liver Function Tests:  Recent Labs Lab 10/31/13 0133 10/31/13 1144  AST 20  --   ALT 11  --   ALKPHOS 80  --   BILITOT 0.2*  --   PROT 5.4* 4.8*  ALBUMIN 2.0*  --    No results found for this basename: LIPASE, AMYLASE,  in the last 168 hours No results found for this basename: AMMONIA,  in the last 168 hours CBC:  Recent Labs Lab 10/31/13 0133 11/01/13 0346 11/05/13 0512  WBC 12.9* 12.6* 12.0*  NEUTROABS 11.1*  --   --   HGB 12.8* 12.3* 12.3*  HCT 40.8 39.6 41.3  MCV 85.5 85.3 88.1  PLT 424* 414* 328   Cardiac Enzymes:  Recent Labs Lab 10/31/13 0133  TROPONINI <0.30   BNP: BNP (last 3 results)  Recent Labs  09/24/13 1637  PROBNP 221.7*   CBG: No results found for this basename: GLUCAP,   in the last 168 hours  Time coordinating discharge: Over 30 minutes

## 2013-11-16 ENCOUNTER — Other Ambulatory Visit: Payer: Self-pay | Admitting: Hematology

## 2013-11-16 ENCOUNTER — Telehealth: Payer: Self-pay | Admitting: Hematology

## 2013-11-16 NOTE — Telephone Encounter (Signed)
[facility] notified me that Mr [name] [name] passed away today. He had advanced NSCLC.

## 2013-12-10 DEATH — deceased

## 2013-12-13 LAB — AFB CULTURE WITH SMEAR (NOT AT ARMC): Acid Fast Smear: NONE SEEN
